# Patient Record
Sex: Female | Born: 1945 | Race: White | Hispanic: No | Marital: Married | Smoking: Current every day smoker
Health system: Southern US, Community
[De-identification: ages and names within clinical notes are randomized; demographics above are authoritative.]

## PROBLEM LIST (undated history)

## (undated) DIAGNOSIS — C801 Malignant (primary) neoplasm, unspecified: Secondary | ICD-10-CM

## (undated) DIAGNOSIS — K635 Polyp of colon: Secondary | ICD-10-CM

## (undated) DIAGNOSIS — K644 Residual hemorrhoidal skin tags: Secondary | ICD-10-CM

## (undated) DIAGNOSIS — J449 Chronic obstructive pulmonary disease, unspecified: Secondary | ICD-10-CM

## (undated) DIAGNOSIS — D099 Carcinoma in situ, unspecified: Secondary | ICD-10-CM

## (undated) DIAGNOSIS — R42 Dizziness and giddiness: Secondary | ICD-10-CM

## (undated) DIAGNOSIS — H269 Unspecified cataract: Secondary | ICD-10-CM

## (undated) DIAGNOSIS — M810 Age-related osteoporosis without current pathological fracture: Secondary | ICD-10-CM

## (undated) DIAGNOSIS — E785 Hyperlipidemia, unspecified: Secondary | ICD-10-CM

## (undated) DIAGNOSIS — K589 Irritable bowel syndrome without diarrhea: Secondary | ICD-10-CM

## (undated) DIAGNOSIS — K5790 Diverticulosis of intestine, part unspecified, without perforation or abscess without bleeding: Secondary | ICD-10-CM

## (undated) DIAGNOSIS — T7840XA Allergy, unspecified, initial encounter: Secondary | ICD-10-CM

## (undated) HISTORY — DX: Age-related osteoporosis without current pathological fracture: M81.0

## (undated) HISTORY — DX: Polyp of colon: K63.5

## (undated) HISTORY — DX: Dizziness and giddiness: R42

## (undated) HISTORY — DX: Diverticulosis of intestine, part unspecified, without perforation or abscess without bleeding: K57.90

## (undated) HISTORY — PX: PAROTIDECTOMY: SUR1003

## (undated) HISTORY — DX: Hyperlipidemia, unspecified: E78.5

## (undated) HISTORY — PX: HAMMER TOE SURGERY: SHX385

## (undated) HISTORY — PX: COLONOSCOPY: SHX174

## (undated) HISTORY — PX: TUBAL LIGATION: SHX77

## (undated) HISTORY — PX: HEMORRHOID BANDING: SHX5850

## (undated) HISTORY — PX: POLYPECTOMY: SHX149

## (undated) HISTORY — PX: LEEP: SHX91

## (undated) HISTORY — PX: BREAST BIOPSY: SHX20

## (undated) HISTORY — DX: Carcinoma in situ, unspecified: D09.9

## (undated) HISTORY — DX: Allergy, unspecified, initial encounter: T78.40XA

## (undated) HISTORY — DX: Residual hemorrhoidal skin tags: K64.4

## (undated) HISTORY — DX: Chronic obstructive pulmonary disease, unspecified: J44.9

## (undated) HISTORY — DX: Unspecified cataract: H26.9

## (undated) HISTORY — PX: TONSILLECTOMY: SUR1361

## (undated) HISTORY — DX: Irritable bowel syndrome, unspecified: K58.9

## (undated) HISTORY — PX: GANGLION CYST EXCISION: SHX1691

---

## 2009-11-21 ENCOUNTER — Encounter: Payer: Self-pay | Admitting: Family Medicine

## 2010-01-24 ENCOUNTER — Encounter: Payer: Self-pay | Admitting: Family Medicine

## 2010-03-06 ENCOUNTER — Ambulatory Visit: Payer: Self-pay | Admitting: Family Medicine

## 2010-03-06 DIAGNOSIS — E785 Hyperlipidemia, unspecified: Secondary | ICD-10-CM | POA: Insufficient documentation

## 2010-03-06 DIAGNOSIS — J309 Allergic rhinitis, unspecified: Secondary | ICD-10-CM | POA: Insufficient documentation

## 2010-03-25 ENCOUNTER — Encounter: Payer: Self-pay | Admitting: Family Medicine

## 2010-05-29 ENCOUNTER — Telehealth: Payer: Self-pay | Admitting: Family Medicine

## 2010-05-29 ENCOUNTER — Encounter (INDEPENDENT_AMBULATORY_CARE_PROVIDER_SITE_OTHER): Payer: Self-pay | Admitting: *Deleted

## 2010-05-30 ENCOUNTER — Encounter: Payer: Self-pay | Admitting: Family Medicine

## 2010-06-10 ENCOUNTER — Telehealth: Payer: Self-pay | Admitting: Family Medicine

## 2010-06-13 ENCOUNTER — Encounter: Payer: Self-pay | Admitting: Family Medicine

## 2010-06-18 NOTE — Letter (Signed)
Summary: Records from Dr. Lenn Cal 2007 - 2011  Records from Dr. Lenn Cal 2007 - 2011   Imported By: Maryln Gottron 03/21/2010 14:04:09  _____________________________________________________________________  External Attachment:    Type:   Image     Comment:   External Document

## 2010-06-18 NOTE — Assessment & Plan Note (Signed)
Summary: NEW PT TO EST/CLE   Vital Signs:  Patient profile:   65 year old female Height:      62 inches Weight:      161 pounds BMI:     29.55 Temp:     98.3 degrees F oral Pulse rate:   72 / minute Pulse rhythm:   regular BP sitting:   130 / 70  (left arm) Cuff size:   regular  Vitals Entered By: Linde Gillis CMA Duncan Dull) (March 06, 2010 10:51 AM) CC: new patient, establish care   History of Present Illness: 65 yo here to establish care.  Relocated here from [place] a few months ago to be closer to her daughter who lives in [place]. Overall, adjusting well here.  Misses her friends but she and her husband travel frequently since they are retired.  She is enjoying retirement. Spends winters at their home in [place].  HLD- On Pravachol 10 mg daily.  Has been doing well on this for years.  Brings in her lab work from this past summer.  Seasonal allergies- sinus congestion seems worse since she moved here.  taking Mucinex D almost every day.  No wheezing, cough or shortness of breath.  No fevers or chills. Sometimes develops facial pressure.  Never been on anything else for her allergies.  Tobacco abuse- 1 ppd x 40 years.  Never quit in past, she is considering it.    Preventive Screening-Counseling & Management  Alcohol-Tobacco     Smoking Status: current      Drug Use:  no.    Current Medications (verified): 1)  Pravachol 10 Mg Tabs (Pravastatin Sodium) .... Take One Tablet By Mouth Daily 2)  Aspirin 81 Mg Tabs (Aspirin) .... Take One Tablet By Mouth Daily 3)  Estroven  Tabs (Nutritional Supplements) .... Take One Tablet By Mouth Daily 4)  Fluticasone Propionate 50 Mcg/act  Susp (Fluticasone Propionate) .... 2 Sprays Each Nostril Once Daily  Allergies (verified): 1)  ! Penicillin  Past History:  Family History: Last updated: 03/06/2010 Family History Ovarian cancer- mom died at 29 Dad died at 41 of severe asthma attack.  Social History: Last updated:  03/06/2010 Retired Married One daughter. Moved from [place] to be closer to her daughter, spends winters at [facility] home. Current Smoker Alcohol use-yes Drug use-no  Risk Factors: Smoking Status: current (03/06/2010)  Past Medical History: Hyperlipidemia Allergic rhinitis  Past Surgical History: Tonsillectomy Tubal ligation  Family History: Family History Ovarian cancer- mom died at 17 Dad died at 70 of severe asthma attack.  Social History: Retired Married One daughter. Moved from [place] to be closer to her daughter, spends winters at [facility] home. Current Smoker Alcohol use-yes Drug use-no Smoking Status:  current Drug Use:  no  Review of Systems      See HPI General:  Denies malaise. Eyes:  Denies blurring. ENT:  Denies difficulty swallowing. CV:  Denies chest pain or discomfort. Resp:  Denies shortness of breath. GI:  Denies abdominal pain, bloody stools, and change in bowel habits. GU:  Denies discharge and dysuria. MS:  Denies joint pain, joint redness, and joint swelling. Derm:  Denies rash. Neuro:  Denies headaches. Psych:  Denies anxiety and depression. Endo:  Denies cold intolerance and heat intolerance. Heme:  Denies abnormal bruising and bleeding. Allergy:  Complains of seasonal allergies.  Physical Exam  General:  alert, well-developed, and well-nourished.   Head:  normocephalic and atraumatic.   Eyes:  vision grossly intact,  pupils equal, pupils round, and pupils reactive to light.   Ears:  R ear normal and L ear normal.   Nose:  boggy turbinates. no mucosal friability, no sinus percussion tenderness, and no septum abnormalities.   Mouth:  good dentition and no gingival abnormalities.   Lungs:  Normal respiratory effort, chest expands symmetrically. Lungs are clear to auscultation, no crackles or wheezes. Heart:  Normal rate and regular rhythm. S1 and S2 normal without gallop, murmur, click, rub or other extra  sounds. Abdomen:  Bowel sounds positive,abdomen soft and non-tender without masses, organomegaly or hernias noted. Msk:  No deformity or scoliosis noted of thoracic or lumbar spine.   Extremities:  no edema Neurologic:  alert & oriented X3 and gait normal.   Skin:  Intact without suspicious lesions or rashes Psych:  Cognition and judgment appear intact. Alert and cooperative with normal attention span and concentration. No apparent delusions, illusions, hallucinations   Impression & Recommendations:  Problem # 1:  HYPERLIPIDEMIA (ICD-272.4) Assessment Unchanged Well controlled, continue current meds. Her updated medication list for this problem includes:    Pravachol 10 Mg Tabs (Pravastatin sodium) .Marland Kitchen... Take one tablet by mouth daily  Problem # 2:  ALLERGIC RHINITIS (ICD-477.9) Assessment: Deteriorated Likely due to new allergens in area.  Will start Flonase.  Pt to call in 1-2 months with an update. Her updated medication list for this problem includes:    Fluticasone Propionate 50 Mcg/act Susp (Fluticasone propionate) .Marland Kitchen... 2 sprays each nostril once daily  Problem # 3:  Preventive Health Care (ICD-V70.0) Assessment: Comment Only UTD on preventative care.  She will need "welcome to medicare visit" after 04/2010.  Pt will schedule this when she returns from [place].  Complete Medication List: 1)  Pravachol 10 Mg Tabs (Pravastatin sodium) .... Take one tablet by mouth daily 2)  Aspirin 81 Mg Tabs (Aspirin) .... Take one tablet by mouth daily 3)  Estroven Tabs (Nutritional supplements) .... Take one tablet by mouth daily 4)  Fluticasone Propionate 50 Mcg/act Susp (Fluticasone propionate) .... 2 sprays each nostril once daily  Patient Instructions: 1)  Great to meet you. 2)  Have a wonderful time in [place]. 3)  Please call me in a couple of months and let me know how you are feeling.   Prescriptions: FLUTICASONE PROPIONATE 50 MCG/ACT  SUSP (FLUTICASONE PROPIONATE) 2 sprays each  nostril once daily  #1 vial x 3   Entered and Authorized by:   Ruthe Mannan MD   Signed by:   Ruthe Mannan MD on 03/06/2010   Method used:   Print then Give to Patient   RxID:   [id number]    Orders Added: 1)  New Patient Level II [56213]    Current Allergies (reviewed today): ! PENICILLIN Herpes Zoster Result Date:  08/22/2009 Herpes Zoster Result:  historical HDL Result Date:  11/29/2009 HDL Result:  63 LDL Result Date:  11/29/2009 LDL Result:  87 Colonoscopy Result Date:  02/24/2006 Colonoscopy Result:  historical Colonoscopy Next Due:  5 yr PAP Result Date:  11/20/2009 PAP Result:  normal Mammogram Result Date:  11/21/2009 Mammogram Result:  normal

## 2010-06-18 NOTE — Miscellaneous (Signed)
Summary: Flu vaccine  Clinical Lists Changes  Observations: Added new observation of FLU VAX: Historical (03/21/2010 16:25)      Influenza Immunization History:    Influenza # 1:  Historical (03/21/2010) Received form from [facility]/[address], [place], [place]

## 2010-06-18 NOTE — Letter (Signed)
Summary: Records Dated 11-21-09 thru 01-24-10/[facility]  Records Dated 11-21-09 thru 01-24-10/[facility]   Imported By: Lanelle Bal 03/13/2010 12:58:35  _____________________________________________________________________  External Attachment:    Type:   Image     Comment:   External Document

## 2010-06-20 NOTE — Progress Notes (Signed)
Summary: regarding pravachol  Phone Note From Pharmacy   Caller: [facility] Summary of Call: [facility] is asking for substitution on pravachol 10 mg's.  Pt cant take generic so she is asking that we send in brand name 20 mg's and she will cut in half.  Form is on your shelf Initial call taken by: [name] CMA, AAMA,  May 29, 2010 10:12 AM  Follow-up for Phone Call        in my box. Ruthe Mannan MD  May 29, 2010 10:15 AM Form faxed.    [name] CMA, AAMA  May 29, 2010 12:59 PM

## 2010-06-20 NOTE — Miscellaneous (Signed)
Summary: med list update  Clinical Lists Changes  Medications: Changed medication from PRAVACHOL 10 MG TABS (PRAVASTATIN SODIUM) take one tablet by mouth daily to PRAVACHOL 20 MG TABS (PRAVASTATIN SODIUM) take one half tab by mouth daily     Prior Medications: ASPIRIN 81 MG TABS (ASPIRIN) take one tablet by mouth daily ESTROVEN  TABS (NUTRITIONAL SUPPLEMENTS) take one tablet by mouth daily FLUTICASONE PROPIONATE 50 MCG/ACT  SUSP (FLUTICASONE PROPIONATE) 2 sprays each nostril once daily Current Allergies: ! PENICILLIN

## 2010-06-20 NOTE — Medication Information (Signed)
Summary: New Rx for Pravachol  New Rx for Pravachol   Imported By: Maryln Gottron 06/12/2010 12:39:23  _____________________________________________________________________  External Attachment:    Type:   Image     Comment:   External Document

## 2010-06-26 NOTE — Medication Information (Signed)
Summary: drug request form  drug request form   Imported By: Kassie Mends 06/21/2010 11:06:00  _____________________________________________________________________  External Attachment:    Type:   Image     Comment:   External Document

## 2010-07-04 NOTE — Progress Notes (Signed)
Summary: provachol  Phone Note Call from Patient Call back at Home Phone [phone]   Caller: Patient Call For: Abigail Willis Complaint: Cough/Sore throat Summary of Call: Patient received letter from [facility] stating that provachol is not on the formulary. She is asking if you could write rx for something else. Please call patient when rx is ready. Initial call taken by: Melody Comas,  June 10, 2010 2:18 PM  Follow-up for Phone Call        Spoke with patient, she does not want another medications she wants [place] to call her insurance company to get prior authorization or an exception as to why she needs the brand name of this medication.  Will send to Jacki Cones to start the prior authorization process.  Linde Gillis CMA Duncan Dull)  June 10, 2010 2:45 PM   Additional Follow-up for Phone Call Additional follow up Details #1::        [facility] for pt to call.  I need the phone number of her insurance company listed on the letter that she received, as well as her ID number.   [name] CMA, AAMA  June 11, 2010 12:56 PM  Prior Berkley Harvey form is on your desk, pt states she is unable to take the generic, that caused dizziness. Additional Follow-up by: [name] CMA, AAMA,  June 13, 2010 10:33 AM    Additional Follow-up for Phone Call Additional follow up Details #2::    in my box. Abigail Willis  June 13, 2010 2:55 PM  Denial letter for pravachol is on your desk.              [name] CMA, AAMA  June 18, 2010 4:07 PM please inform pt. Abigail Willis  June 19, 2010 7:35 AM          New/Updated Medications: SIMVASTATIN 20 MG TABS (SIMVASTATIN) 1 by mouth at bedtime Prescriptions: SIMVASTATIN 20 MG TABS (SIMVASTATIN) 1 by mouth at bedtime  #90 x 3   Entered by:   Melody Comas   Authorized by:   Abigail Willis   Signed by:   Melody Comas on 06/26/2010   Method used:   Faxed to ...       [facility] (mail-order)             , [place]         Ph: [phone]  Fax: [phone]   RxID:   [id number] SIMVASTATIN 20 MG TABS (SIMVASTATIN) 1 by mouth at bedtime  #90 x 3   Entered and Authorized by:   Abigail Willis   Signed by:   Abigail Willis on 06/10/2010   Method used:   Print then Give to Patient   RxID:   [id number]

## 2010-08-21 ENCOUNTER — Other Ambulatory Visit: Payer: Self-pay | Admitting: Family Medicine

## 2010-08-21 DIAGNOSIS — E785 Hyperlipidemia, unspecified: Secondary | ICD-10-CM

## 2010-08-21 DIAGNOSIS — Z0000 Encounter for general adult medical examination without abnormal findings: Secondary | ICD-10-CM

## 2010-08-26 ENCOUNTER — Other Ambulatory Visit (INDEPENDENT_AMBULATORY_CARE_PROVIDER_SITE_OTHER): Payer: Medicare Other | Admitting: Family Medicine

## 2010-08-26 DIAGNOSIS — E785 Hyperlipidemia, unspecified: Secondary | ICD-10-CM

## 2010-08-26 DIAGNOSIS — Z0000 Encounter for general adult medical examination without abnormal findings: Secondary | ICD-10-CM

## 2010-08-26 LAB — HEPATIC FUNCTION PANEL
Albumin: 3.8 g/dL (ref 3.5–5.2)
Total Bilirubin: 0.5 mg/dL (ref 0.3–1.2)

## 2010-08-26 LAB — LIPID PANEL
HDL: 64 mg/dL (ref >39.00)
Total CHOL/HDL Ratio: 3
VLDL: 22.8 mg/dL (ref 0.0–40.0)

## 2010-08-26 LAB — BASIC METABOLIC PANEL
Calcium: 9 mg/dL (ref 8.4–10.5)
Creatinine, Ser: 0.8 mg/dL (ref 0.4–1.2)

## 2010-08-29 ENCOUNTER — Encounter: Payer: Self-pay | Admitting: Family Medicine

## 2010-08-29 LAB — HM PAP SMEAR

## 2010-08-29 LAB — HM MAMMOGRAPHY

## 2010-09-03 ENCOUNTER — Other Ambulatory Visit: Payer: Self-pay | Admitting: Family Medicine

## 2010-09-03 ENCOUNTER — Encounter: Payer: Self-pay | Admitting: Family Medicine

## 2010-09-03 ENCOUNTER — Ambulatory Visit (INDEPENDENT_AMBULATORY_CARE_PROVIDER_SITE_OTHER): Payer: MEDICARE | Admitting: Family Medicine

## 2010-09-03 DIAGNOSIS — Z0000 Encounter for general adult medical examination without abnormal findings: Secondary | ICD-10-CM | POA: Insufficient documentation

## 2010-09-03 DIAGNOSIS — Z1211 Encounter for screening for malignant neoplasm of colon: Secondary | ICD-10-CM

## 2010-09-03 DIAGNOSIS — Z1231 Encounter for screening mammogram for malignant neoplasm of breast: Secondary | ICD-10-CM

## 2010-09-03 DIAGNOSIS — Z1239 Encounter for other screening for malignant neoplasm of breast: Secondary | ICD-10-CM

## 2010-09-03 MED ORDER — PRAVASTATIN SODIUM 10 MG PO TABS: 10.0000 mg | ORAL_TABLET | Freq: Every evening | ORAL | Status: DC

## 2010-09-03 MED ORDER — PRAVACHOL 10 MG PO TABS: 10.0000 mg | ORAL_TABLET | Freq: Every evening | ORAL | Status: DC

## 2010-09-03 MED ORDER — PNEUMOCOCCAL VAC POLYVALENT 25 MCG/0.5ML IJ INJ: 0.5000 mL | INJECTION | Freq: Once | INTRAMUSCULAR | Status: DC

## 2010-09-03 NOTE — Assessment & Plan Note (Signed)
The patients weight, height, BMI and visual acuity have been recorded in the chart I have made referrals, counseling and provided education to the patient based review of the above and I have provided the pt with a written personalized care plan for preventive services.  

## 2010-09-03 NOTE — Patient Instructions (Signed)
Please stop by to see Abigail Willis on your way out. 

## 2010-09-03 NOTE — Progress Notes (Signed)
I have personally reviewed the Medicare Annual Wellness questionnaire and have noted 1. The patient's medical and social history 2. Their use of alcohol, tobacco or illicit drugs- current smoker. 3. Their current medications and supplements 4. The patient's functional ability including ADL's, fall risks, home safety risks and hearing or visual             Impairment.- independent for all ADLs and IADLs.  See nursing assessment for hearing/visiton. 5. Diet and physical activities- very physically active. 6. Evidence for depression or mood disorders- none  Discussed directives.  Pt is a full code.  The PMH, PSH, Social History, Family History, Medications, and allergies have been reviewed in [facility], and have been updated if relevant.

## 2010-09-03 NOTE — Progress Notes (Signed)
Addended by: Linde Gillis on: 09/03/2010 10:07 AM   Modules accepted: Orders

## 2010-09-03 NOTE — Progress Notes (Signed)
Addended by: Linde Gillis on: 09/03/2010 10:03 AM   Modules accepted: Orders

## 2010-09-10 ENCOUNTER — Telehealth: Payer: Self-pay | Admitting: *Deleted

## 2010-09-10 NOTE — Telephone Encounter (Signed)
Received faxed from [facility] stating that Pravachol 10mg  tablets are discontinued by the manufacturer.  Pravastatin 10mg  tablets are available.  Please advise, form in your IN box.

## 2010-09-11 ENCOUNTER — Encounter: Payer: Self-pay | Admitting: Family Medicine

## 2010-09-11 NOTE — Telephone Encounter (Signed)
Form faxed to [facility] and patient notified via telephone.

## 2010-09-11 NOTE — Telephone Encounter (Signed)
In my box

## 2010-11-01 ENCOUNTER — Telehealth: Payer: Self-pay | Admitting: *Deleted

## 2010-11-01 ENCOUNTER — Ambulatory Visit (AMBULATORY_SURGERY_CENTER): Payer: Medicare Other | Admitting: *Deleted

## 2010-11-01 VITALS — Ht 62.0 in | Wt 159.2 lb

## 2010-11-01 DIAGNOSIS — Z860100 Personal history of colon polyps, unspecified: Secondary | ICD-10-CM

## 2010-11-01 MED ORDER — PEG-KCL-NACL-NASULF-NA ASC-C 100 G PO SOLR: ORAL | Status: DC

## 2010-11-01 NOTE — Telephone Encounter (Signed)
PATIENT SCHEDULED FOR COLONOSCOPY WITH DR.JACOBS ON 11-15-10. SHE WAS IN FOR PREVISIT TODAY,STATES SHE HAS 2 PREVIOUS COLONOSCOPIES 2002&2007 AT [facility] BY DR.COOPER. SHE STATES SHE HAD POLYPS REMOVED DURING 1ST COLON. RELEASE OF INFORMATION SHEET SIGNED BY PATIENT AND GIVEN TO PAM PETERMAN,CMA AND SHE PLACED THIS IN YOUR BASKET AT YOUR DESK.  THANKS

## 2010-11-01 NOTE — Progress Notes (Signed)
PATIENT HAS 2 PREVIOUS COLONOSCOPIES. 2002&2007 AT [facility] BY DR.COOPER. SHE STATES SHE HAD POLYPS REMOVED DURING HER 1ST EXAM. RELEASE OF INFORMATION SHEET FILLED OUT AND SIGNED BY PATIENT, TODAY IT WAS GIVEN TO PAM PETERMAN,CMA AND PHONE NOTE SENT TO PATTY LEWIS,CMA.

## 2010-11-01 NOTE — Telephone Encounter (Signed)
Faxed Release Of Information form to [phone], [facility], [address] [employer] office. Fax [phone], [place] [phone].

## 2010-11-04 ENCOUNTER — Encounter: Payer: Self-pay | Admitting: Gastroenterology

## 2010-11-05 NOTE — Telephone Encounter (Signed)
Dr Jacobs the records are on your desk for review.  

## 2010-11-06 ENCOUNTER — Telehealth: Payer: Self-pay | Admitting: Gastroenterology

## 2010-11-06 NOTE — Telephone Encounter (Signed)
Pt aware to keep appt on 11/15/10 for colon

## 2010-11-06 NOTE — Telephone Encounter (Signed)
Colonoscopy 10/98 [place] Dr. [name]: 2 polyps, one was 1cm and pedunculated, TA on pathology; hemorrhoids Colonoscopy 02/2004 [place] Dr. [name]; "a few small sessile polyps' were hyperplastic on pathology; diverticulosis   Abigail Willis, she is good for direct colonoscopy for polyp surveillance (v12.72)

## 2010-11-06 NOTE — Telephone Encounter (Signed)
Just saw those records, opened a new telephone note before I got to this one.

## 2010-11-15 ENCOUNTER — Ambulatory Visit (AMBULATORY_SURGERY_CENTER): Payer: Medicare Other | Admitting: Gastroenterology

## 2010-11-15 ENCOUNTER — Encounter: Payer: Self-pay | Admitting: Gastroenterology

## 2010-11-15 DIAGNOSIS — K5790 Diverticulosis of intestine, part unspecified, without perforation or abscess without bleeding: Secondary | ICD-10-CM

## 2010-11-15 DIAGNOSIS — D126 Benign neoplasm of colon, unspecified: Secondary | ICD-10-CM

## 2010-11-15 DIAGNOSIS — Z860100 Personal history of colon polyps, unspecified: Secondary | ICD-10-CM

## 2010-11-15 DIAGNOSIS — K5730 Diverticulosis of large intestine without perforation or abscess without bleeding: Secondary | ICD-10-CM

## 2010-11-15 DIAGNOSIS — K635 Polyp of colon: Secondary | ICD-10-CM

## 2010-11-15 DIAGNOSIS — Z1211 Encounter for screening for malignant neoplasm of colon: Secondary | ICD-10-CM

## 2010-11-15 HISTORY — DX: Diverticulosis of intestine, part unspecified, without perforation or abscess without bleeding: K57.90

## 2010-11-15 MED ORDER — SODIUM CHLORIDE 0.9 % IV SOLN: 500.0000 mL | INTRAVENOUS | Status: DC

## 2010-11-15 NOTE — Patient Instructions (Signed)
Follow your discharge instructions.  Continue your medications.  Await pathology results.

## 2010-11-18 ENCOUNTER — Telehealth: Payer: Self-pay

## 2010-11-18 NOTE — Telephone Encounter (Signed)

## 2010-12-02 ENCOUNTER — Ambulatory Visit: Payer: MEDICARE

## 2010-12-03 ENCOUNTER — Ambulatory Visit
Admission: RE | Admit: 2010-12-03 | Discharge: 2010-12-03 | Disposition: A | Discharge status: Home / Self Care | Payer: Medicare Other | Source: Ambulatory Visit | Attending: Family Medicine | Admitting: Family Medicine

## 2010-12-03 DIAGNOSIS — Z1231 Encounter for screening mammogram for malignant neoplasm of breast: Secondary | ICD-10-CM

## 2010-12-05 ENCOUNTER — Ambulatory Visit (INDEPENDENT_AMBULATORY_CARE_PROVIDER_SITE_OTHER): Payer: Medicare Other | Admitting: Family Medicine

## 2010-12-05 ENCOUNTER — Encounter: Payer: Self-pay | Admitting: Family Medicine

## 2010-12-05 ENCOUNTER — Other Ambulatory Visit (HOSPITAL_COMMUNITY)
Admission: RE | Admit: 2010-12-05 | Discharge: 2010-12-05 | Disposition: A | Discharge status: Home / Self Care | Payer: Medicare Other | Source: Ambulatory Visit | Attending: Family Medicine | Admitting: Family Medicine

## 2010-12-05 ENCOUNTER — Encounter: Payer: Self-pay | Admitting: *Deleted

## 2010-12-05 VITALS — BP 110/70 | HR 73 | Temp 98.4°F | Ht 62.0 in | Wt 157.5 lb

## 2010-12-05 DIAGNOSIS — Z1159 Encounter for screening for other viral diseases: Secondary | ICD-10-CM | POA: Insufficient documentation

## 2010-12-05 DIAGNOSIS — Z01419 Encounter for gynecological examination (general) (routine) without abnormal findings: Secondary | ICD-10-CM

## 2010-12-05 DIAGNOSIS — Z124 Encounter for screening for malignant neoplasm of cervix: Secondary | ICD-10-CM | POA: Insufficient documentation

## 2010-12-05 NOTE — Progress Notes (Signed)
65 yo here for GYN exam. Does have h/o previous leep procedures. Had mammogram this year, colonoscopy in June. No concerns.  UTD on all prevention.   Patient Active Problem List  Diagnoses  . HYPERLIPIDEMIA  . ALLERGIC RHINITIS  . Routine general medical examination at a health care facility  . Gynecological examination   Past Medical History  Diagnosis Date  . Hyperlipidemia   . Allergy     SINUS  . IBS (irritable bowel syndrome)    Past Surgical History  Procedure Date  . Tubal ligation   . Tonsillectomy   . Colonoscopy   . Ganglion cyst excision   . Hammer toe surgery   . Breast biopsy   . Polypectomy    History  Substance Use Topics  . Smoking status: Current Everyday Smoker -- 1.0 packs/day    Types: Cigarettes  . Smokeless tobacco: Never Used  . Alcohol Use: 0.6 oz/week    1 Glasses of wine per week     SOCIAL   Family History  Problem Relation Age of Onset  . Cancer Mother   . Asthma Father   . Colon cancer Neg Hx    Allergies  Allergen Reactions  . Penicillins     REACTION: rash   Current Outpatient Prescriptions on File Prior to Visit  Medication Sig Dispense Refill  . aspirin 81 MG tablet Take 81 mg by mouth daily.        . fluticasone (FLONASE) 50 MCG/ACT nasal spray 2 sprays by Nasal route daily.        Marland Kitchen ibuprofen (ADVIL,MOTRIN) 200 MG tablet Take 200 mg by mouth as needed.        . Nutritional Supplements (ESTROVEN) TABS Take 1 tablet by mouth daily.        Marland Kitchen PRAVACHOL 10 MG tablet Take 1 tablet (10 mg total) by mouth every evening.  90 tablet  3  . pseudoephedrine-guaifenesin (MUCINEX D) 60-600 MG per tablet Take 1 tablet by mouth every 12 (twelve) hours as needed.         Current Facility-Administered Medications on File Prior to Visit  Medication Dose Route Frequency Provider Last Rate Last Dose  . 0.9 %  sodium chloride infusion  500 mL Intravenous Continuous Rob Bunting, MD       The PMH, PSH, Social History, Family History,  Medications, and allergies have been reviewed in [facility], and have been updated if relevant.    Review of Systems       See HPI General:  Denies malaise. Eyes:  Denies blurring. ENT:  Denies difficulty swallowing. CV:  Denies chest pain or discomfort. Resp:  Denies shortness of breath. GI:  Denies abdominal pain, bloody stools, and change in bowel habits. GU:  Denies discharge and dysuria. MS:  Denies joint pain, joint redness, and joint swelling. Derm:  Denies rash. Neuro:  Denies headaches. Psych:  Denies anxiety and depression. Endo:  Denies cold intolerance and heat intolerance. Heme:  Denies abnormal bruising and bleeding.   Physical Exam BP 110/70  Pulse 73  Temp(Src) 98.4 F (36.9 C) (Oral)  Ht 5\' 2"  (1.575 m)  Wt 157 lb 8 oz (71.442 kg)  BMI 28.81 kg/m2  General:  Well-developed,well-nourished,in no acute distress; alert,appropriate and cooperative throughout examination Head:  normocephalic and atraumatic.   Ears:  R ear normal and L ear normal.   Nose:  no external deformity.   Mouth:  good dentition.   Heart:  Normal rate and regular rhythm. S1 and  S2 normal without gallop, murmur, click, rub or other extra sounds. Abdomen:  Bowel sounds positive,abdomen soft and non-tender without masses, organomegaly or hernias noted. Rectal:  no external abnormalities.   Genitalia:  Pelvic Exam:        External: normal female genitalia without lesions or masses        Vagina: normal without lesions or masses        Cervix: normal without lesions or masses        Adnexa: normal bimanual exam without masses or fullness        Uterus: normal by palpation        Pap smear: performed Msk:  No deformity or scoliosis noted of thoracic or lumbar spine.   Extremities:  No clubbing, cyanosis, edema, or deformity noted with normal full range of motion of all joints.   Neurologic:  alert & oriented X3 and gait normal.   Skin:  Intact without suspicious lesions or rashes Cervical Nodes:   No lymphadenopathy noted Axillary Nodes:  No palpable lymphadenopathy Psych:  Cognition and judgment appear intact. Alert and cooperative with normal attention span and concentration. No apparent delusions, illusions, hallucinations  Assessment and Plan: 1. Gynecological examination    Pap smear performed today. Would repeat one more given previous LEEP procedures. The patient indicates understanding of these issues and agrees with the plan.

## 2010-12-12 ENCOUNTER — Ambulatory Visit (INDEPENDENT_AMBULATORY_CARE_PROVIDER_SITE_OTHER): Payer: Medicare Other | Admitting: Family Medicine

## 2010-12-12 ENCOUNTER — Encounter: Payer: Self-pay | Admitting: Family Medicine

## 2010-12-12 VITALS — BP 110/70 | HR 60 | Temp 98.5°F | Wt 158.0 lb

## 2010-12-12 DIAGNOSIS — Z01419 Encounter for gynecological examination (general) (routine) without abnormal findings: Secondary | ICD-10-CM

## 2010-12-12 NOTE — Progress Notes (Signed)
  Subjective:    Patient ID: Abigail Willis, female    DOB: [birth year], 65 y.o.   MRN: [id number]  HPI  Very pleasant female here for repeat pap smear. Pap smear performed last week during routine gynecological examination. Specimen inadequate due to lubricant.  Review of Systems     Objective:   Physical Exam There were no vitals taken for this visit.  General:  Well-developed,well-nourished,in no acute distress; alert,appropriate and cooperative throughout examination Genitalia:  Pelvic Exam:        External: normal female genitalia without lesions or masses        Vagina: normal without lesions or masses        Cervix: normal without lesions or masses        Adnexa: normal bimanual exam without masses or fullness        Uterus: normal by palpation        Pap smear: performed Psych:  Cognition and judgment appear intact. Alert and cooperative with normal attention span and concentration. No apparent delusions, illusions, hallucinations        Assessment & Plan:   1. Gynecological examination    Repeat pap smear today, free of charge, without lubricant. Does need to continue screening as she had a coloposcopy as recently as 02/2010.

## 2010-12-16 ENCOUNTER — Encounter: Payer: Self-pay | Admitting: *Deleted

## 2011-03-14 ENCOUNTER — Encounter: Payer: Self-pay | Admitting: Family Medicine

## 2011-03-14 ENCOUNTER — Encounter: Payer: Self-pay | Admitting: *Deleted

## 2011-03-14 ENCOUNTER — Ambulatory Visit (INDEPENDENT_AMBULATORY_CARE_PROVIDER_SITE_OTHER): Payer: Medicare Other | Admitting: Family Medicine

## 2011-03-14 VITALS — BP 104/70 | HR 67 | Temp 97.9°F | Ht 62.0 in | Wt 158.5 lb

## 2011-03-14 DIAGNOSIS — R109 Unspecified abdominal pain: Secondary | ICD-10-CM | POA: Insufficient documentation

## 2011-03-14 DIAGNOSIS — E785 Hyperlipidemia, unspecified: Secondary | ICD-10-CM

## 2011-03-14 DIAGNOSIS — Z8041 Family history of malignant neoplasm of ovary: Secondary | ICD-10-CM | POA: Insufficient documentation

## 2011-03-14 DIAGNOSIS — Z23 Encounter for immunization: Secondary | ICD-10-CM

## 2011-03-14 LAB — LIPID PANEL
Cholesterol: 201 mg/dL — ABNORMAL HIGH (ref 0–200)
Total CHOL/HDL Ratio: 3
VLDL: 31.4 mg/dL (ref 0.0–40.0)

## 2011-03-14 LAB — HEPATIC FUNCTION PANEL
Albumin: 4.1 g/dL (ref 3.5–5.2)
Alkaline Phosphatase: 68 U/L (ref 39–117)
Total Bilirubin: 0.5 mg/dL (ref 0.3–1.2)

## 2011-03-14 NOTE — Patient Instructions (Signed)
Good to see you. Please stop by to see Abigail Willis on your way out. I will call you next week with your lab and ultrasound results as soon as I get them. Have a good weekend.

## 2011-03-14 NOTE — Progress Notes (Signed)
Addended by: Dianne Dun on: 03/14/2011 10:39 AM   Modules accepted: Orders

## 2011-03-14 NOTE — Progress Notes (Signed)
Addended by: Dianne Dun on: 03/14/2011 10:47 AM   Modules accepted: Orders

## 2011-03-14 NOTE — Progress Notes (Signed)
65 yo here for follow up.  HLD- On Pravachol 10 mg daily. Has been doing well on this for years but wants her blood work recheck since she switched to generic 6 months ago.  Lab Results  Component Value Date   CHOL 198 08/26/2010   HDL 64.00 08/26/2010   LDLCALC 111* 08/26/2010   TRIG 114.0 08/26/2010   CHOLHDL 3 08/26/2010    Abdominal pain- past month- on and off abdominal fullness, bilateral lower abdominal cramping. Some increased bowel movements but no diarrhea, nausea, vomiting or blood in stool. Colonoscopy neg 2010-11-03. Mom died of ovarian CA at 55.  Patient Active Problem List  Diagnoses  . HYPERLIPIDEMIA  . ALLERGIC RHINITIS  . Routine general medical examination at a health care facility  . Gynecological examination   Past Medical History  Diagnosis Date  . Hyperlipidemia   . Allergy     SINUS  . IBS (irritable bowel syndrome)    Past Surgical History  Procedure Date  . Tubal ligation   . Tonsillectomy   . Colonoscopy   . Ganglion cyst excision   . Hammer toe surgery   . Breast biopsy   . Polypectomy    History  Substance Use Topics  . Smoking status: Current Everyday Smoker -- 1.0 packs/day    Types: Cigarettes  . Smokeless tobacco: Never Used  . Alcohol Use: 0.6 oz/week    1 Glasses of wine per week     SOCIAL   Family History  Problem Relation Age of Onset  . Cancer Mother   . Asthma Father   . Colon cancer Neg Hx    Allergies  Allergen Reactions  . Penicillins     REACTION: rash   Current Outpatient Prescriptions on File Prior to Visit  Medication Sig Dispense Refill  . aspirin 81 MG tablet Take 81 mg by mouth daily.        . fluticasone (FLONASE) 50 MCG/ACT nasal spray 2 sprays by Nasal route daily.        Marland Kitchen ibuprofen (ADVIL,MOTRIN) 200 MG tablet Take 200 mg by mouth as needed.        . Nutritional Supplements (ESTROVEN) TABS Take 1 tablet by mouth daily.        Marland Kitchen PRAVACHOL 10 MG tablet Take 1 tablet (10 mg total) by mouth every evening.  90  tablet  3  . pseudoephedrine-guaifenesin (MUCINEX D) 60-600 MG per tablet Take 1 tablet by mouth every 12 (twelve) hours as needed.         Current Facility-Administered Medications on File Prior to Visit  Medication Dose Route Frequency Provider Last Rate Last Dose  . 0.9 %  sodium chloride infusion  500 mL Intravenous Continuous Rob Bunting, MD       The PMH, PSH, Social History, Family History, Medications, and allergies have been reviewed in [facility], and have been updated if relevant.  Review of Systems  See HPI  General: Denies malaise.  Eyes: Denies blurring.  ENT: Denies difficulty swallowing.  CV: Denies chest pain or discomfort.  Resp: Denies shortness of breath.   Physical exam:  BP 104/70  Pulse 67  Temp(Src) 97.9 F (36.6 C) (Oral)  Ht 5\' 2"  (1.575 m)  Wt 158 lb 8 oz (71.895 kg)  BMI 28.99 kg/m2  General: alert, well-developed, and well-nourished.  Head: normocephalic and atraumatic.  Eyes: vision grossly intact, pupils equal, pupils round, and pupils reactive to light.  Ears: R ear normal and L ear normal.  Mouth:  good dentition and no gingival abnormalities.  Lungs: Normal respiratory effort, chest expands symmetrically. Lungs are clear to auscultation, no crackles or wheezes.  Heart: Normal rate and regular rhythm. S1 and S2 normal without gallop, murmur, click, rub or other extra sounds.  Abdomen: Bowel sounds positive,abdomen soft, mildly TTP over left lower quadrant, no guarding or rebound  Msk: No deformity or scoliosis noted of thoracic or lumbar spine.  Extremities: no edema  Neurologic: alert & oriented X3 and gait normal.  Skin: Intact without suspicious lesions or rashes  Psych: Cognition and judgment appear intact. Alert and cooperative with normal attention span and concentration. No apparent delusions, illusions, hallucinations  Assessment and Plan: 1. Abdominal  pain, other specified site  New, ?IBS since she has been under some increased  stress. However, given her family history, I do want to rule out ovarian process/maligancy with Ultrasound and CA 125. US Pelvis Complete, CA 125  2. Family history of ovarian cancer  CA 125  3. HYPERLIPIDEMIA   Unchanged.  Recheck labs today.   Lipid Profile, Hepatic function panel

## 2011-03-21 ENCOUNTER — Ambulatory Visit: Payer: Self-pay | Admitting: Family Medicine

## 2011-03-24 ENCOUNTER — Encounter: Payer: Self-pay | Admitting: Family Medicine

## 2011-03-25 ENCOUNTER — Other Ambulatory Visit: Payer: Self-pay | Admitting: Family Medicine

## 2011-03-25 DIAGNOSIS — R109 Unspecified abdominal pain: Secondary | ICD-10-CM

## 2011-03-25 DIAGNOSIS — N859 Noninflammatory disorder of uterus, unspecified: Secondary | ICD-10-CM

## 2011-03-25 DIAGNOSIS — Z8041 Family history of malignant neoplasm of ovary: Secondary | ICD-10-CM

## 2011-03-25 DIAGNOSIS — J309 Allergic rhinitis, unspecified: Secondary | ICD-10-CM

## 2011-03-26 ENCOUNTER — Encounter: Payer: Self-pay | Admitting: Obstetrics & Gynecology

## 2011-03-26 ENCOUNTER — Ambulatory Visit (INDEPENDENT_AMBULATORY_CARE_PROVIDER_SITE_OTHER): Payer: Medicare Other | Admitting: Obstetrics & Gynecology

## 2011-03-26 VITALS — BP 122/66 | HR 85 | Ht 62.0 in | Wt 160.0 lb

## 2011-03-26 DIAGNOSIS — R935 Abnormal findings on diagnostic imaging of other abdominal regions, including retroperitoneum: Secondary | ICD-10-CM

## 2011-03-26 NOTE — Progress Notes (Signed)
  Subjective:    Patient ID: Abigail Willis, female    DOB: [birth year], 65 y.o.   MRN: [id number]  HPINo LMP recorded. Patient is postmenopausal. Ultrasound done at [facility] showed endometrial fluid, no increased tissue. U/S in [facility] 2011 had same findings. No vag d/c or bleeding. Sx of IBS, abd pain  Past Medical History  Diagnosis Date  . Hyperlipidemia   . Allergy     SINUS  . IBS (irritable bowel syndrome)    Past Surgical History  Procedure Date  . Tubal ligation   . Tonsillectomy   . Colonoscopy   . Ganglion cyst excision   . Hammer toe surgery   . Breast biopsy   . Polypectomy   . Leep      twice, in [place]   Current Outpatient Prescriptions on File Prior to Visit  Medication Sig Dispense Refill  . ibuprofen (ADVIL,MOTRIN) 200 MG tablet Take 200 mg by mouth as needed.        . Nutritional Supplements (ESTROVEN) TABS Take 1 tablet by mouth daily.        Marland Kitchen PRAVACHOL 10 MG tablet Take 1 tablet (10 mg total) by mouth every evening.  90 tablet  3  . pseudoephedrine-guaifenesin (MUCINEX D) 60-600 MG per tablet Take 1 tablet by mouth every 12 (twelve) hours as needed.        Marland Kitchen aspirin 81 MG tablet Take 81 mg by mouth daily.         Current Facility-Administered Medications on File Prior to Visit  Medication Dose Route Frequency Provider Last Rate Last Dose  . 0.9 %  sodium chloride infusion  500 mL Intravenous Continuous Rob Bunting, MD       Allergies  Allergen Reactions  . Penicillins     REACTION: rash   Family History  Problem Relation Age of Onset  . Cancer Mother   . Asthma Father   . Colon cancer Neg Hx    Mother-ovarian ca age 7   Review of Systems Abd soft, not tenderPelvic deferredPap was done 7/12, nl per pt   Objective:   Physical Exam        Assessment & Plan:  Endometrial fluid stable on U/S, likely due to previious cervical procedures RTC prn Med records release [facility]

## 2011-03-27 ENCOUNTER — Encounter: Payer: Self-pay | Admitting: Family Medicine

## 2011-03-27 ENCOUNTER — Ambulatory Visit (INDEPENDENT_AMBULATORY_CARE_PROVIDER_SITE_OTHER): Payer: Medicare Other | Admitting: Family Medicine

## 2011-03-27 VITALS — BP 120/60 | HR 97 | Temp 97.8°F | Ht 62.0 in | Wt 159.5 lb

## 2011-03-27 DIAGNOSIS — J069 Acute upper respiratory infection, unspecified: Secondary | ICD-10-CM

## 2011-03-27 DIAGNOSIS — J40 Bronchitis, not specified as acute or chronic: Secondary | ICD-10-CM

## 2011-03-27 DIAGNOSIS — J329 Chronic sinusitis, unspecified: Secondary | ICD-10-CM

## 2011-03-27 MED ORDER — MOXIFLOXACIN HCL 400 MG PO TABS: 400.0000 mg | ORAL_TABLET | Freq: Every day | ORAL | Status: DC

## 2011-03-27 MED ORDER — FLUCONAZOLE 150 MG PO TABS: 150.0000 mg | ORAL_TABLET | Freq: Once | ORAL | Status: AC

## 2011-03-27 MED ORDER — BENZONATATE 100 MG PO CAPS: 100.0000 mg | ORAL_CAPSULE | Freq: Four times a day (QID) | ORAL | Status: DC | PRN

## 2011-03-27 NOTE — Progress Notes (Signed)
SUBJECTIVE:  Abigail Willis is a 65 y.o. female who complains of coryza, congestion, productive cough, myalgias, headache and fever for 7 days. She denies a history of anorexia, chest pain, shortness of breath, vomiting and weakness and denies a history of asthma. Patient denies smoke cigarettes.   Patient Active Problem List  Diagnoses  . HYPERLIPIDEMIA  . ALLERGIC RHINITIS  . Routine general medical examination at a health care facility  . Gynecological examination  . Abdominal  pain, other specified site  . Family history of ovarian cancer  . Fluid in endometrial cavity   Past Medical History  Diagnosis Date  . Hyperlipidemia   . Allergy     SINUS  . IBS (irritable bowel syndrome)    Past Surgical History  Procedure Date  . Tubal ligation   . Tonsillectomy   . Colonoscopy   . Ganglion cyst excision   . Hammer toe surgery   . Breast biopsy   . Polypectomy   . Leep      twice, in [place]   History  Substance Use Topics  . Smoking status: Current Everyday Smoker -- 1.0 packs/day    Types: Cigarettes  . Smokeless tobacco: Never Used  . Alcohol Use: 0.6 oz/week    1 Glasses of wine per week     SOCIAL   Family History  Problem Relation Age of Onset  . Cancer Mother   . Asthma Father   . Colon cancer Neg Hx    Allergies  Allergen Reactions  . Penicillins     REACTION: rash   Current Outpatient Prescriptions on File Prior to Visit  Medication Sig Dispense Refill  . aspirin 81 MG tablet Take 81 mg by mouth daily.        Marland Kitchen ibuprofen (ADVIL,MOTRIN) 200 MG tablet Take 200 mg by mouth as needed.        . Nutritional Supplements (ESTROVEN) TABS Take 1 tablet by mouth daily.        Marland Kitchen PRAVACHOL 10 MG tablet Take 1 tablet (10 mg total) by mouth every evening.  90 tablet  3  . pseudoephedrine-guaifenesin (MUCINEX D) 60-600 MG per tablet Take 1 tablet by mouth every 12 (twelve) hours as needed.         Current Facility-Administered Medications on File Prior to Visit    Medication Dose Route Frequency Provider Last Rate Last Dose  . 0.9 %  sodium chloride infusion  500 mL Intravenous Continuous Rob Bunting, MD       The PMH, PSH, Social History, Family History, Medications, and allergies have been reviewed in [facility], and have been updated if relevant.  OBJECTIVE: BP 120/60  Pulse 97  Temp(Src) 97.8 F (36.6 C) (Oral)  Ht 5\' 2"  (1.575 m)  Wt 159 lb 8 oz (72.349 kg)  BMI 29.17 kg/m2  She appears well, vital signs are as noted. Ears normal.  Throat and pharynx normal.  Neck supple. No adenopathy in the neck. Nose is congested. Sinuses TTP throughout. Pos exp wheezes bilaterally,  ASSESSMENT:  sinusitis and bronchitis  PLAN: Avelox 400 mg daily x 7 days (PCN allergic) Symptomatic therapy suggested: push fluids, rest and return office visit prn if symptoms persist or worsen. Lack of antibiotic effectiveness discussed with her. Call or return to clinic prn if these symptoms worsen or fail to improve as anticipated.

## 2011-03-27 NOTE — Patient Instructions (Signed)
Good to see you. Have a great trip! Take antibiotic as directed.  Drink lots of fluids.  Treat sympotmatically with Mucinex, nasal saline irrigation, and Tylenol/Ibuprofen. Cough suppressant as needed.

## 2011-04-02 ENCOUNTER — Encounter: Payer: Medicare Other | Admitting: Obstetrics & Gynecology

## 2011-04-08 ENCOUNTER — Encounter: Payer: Self-pay | Admitting: Family Medicine

## 2011-04-08 ENCOUNTER — Encounter: Payer: Medicare Other | Admitting: Family Medicine

## 2011-04-09 NOTE — Progress Notes (Signed)
This encounter was created in error - please disregard.

## 2011-09-11 ENCOUNTER — Other Ambulatory Visit: Payer: Self-pay

## 2011-09-11 MED ORDER — PRAVASTATIN SODIUM 10 MG PO TABS: 10.0000 mg | ORAL_TABLET | Freq: Every evening | ORAL | Status: DC

## 2011-09-11 NOTE — Telephone Encounter (Signed)
Pt request 30 day refill pravastatin 10 mg #30 x 0 to [facility]. Pt notified done while pt on phone. Pt has CPX with Dr Dayton Martes 10/01/11.

## 2011-09-19 ENCOUNTER — Other Ambulatory Visit: Payer: Self-pay | Admitting: Family Medicine

## 2011-09-19 DIAGNOSIS — E785 Hyperlipidemia, unspecified: Secondary | ICD-10-CM

## 2011-09-19 DIAGNOSIS — Z0000 Encounter for general adult medical examination without abnormal findings: Secondary | ICD-10-CM

## 2011-09-24 ENCOUNTER — Other Ambulatory Visit (INDEPENDENT_AMBULATORY_CARE_PROVIDER_SITE_OTHER): Payer: Medicare Other

## 2011-09-24 DIAGNOSIS — Z0000 Encounter for general adult medical examination without abnormal findings: Secondary | ICD-10-CM

## 2011-09-24 DIAGNOSIS — E785 Hyperlipidemia, unspecified: Secondary | ICD-10-CM

## 2011-09-24 LAB — COMPREHENSIVE METABOLIC PANEL
AST: 26 U/L (ref 0–37)
Albumin: 3.9 g/dL (ref 3.5–5.2)
Alkaline Phosphatase: 68 U/L (ref 39–117)
Calcium: 9 mg/dL (ref 8.4–10.5)
Chloride: 104 mEq/L (ref 96–112)
Glucose, Bld: 89 mg/dL (ref 70–99)
Potassium: 4.2 mEq/L (ref 3.5–5.1)
Sodium: 140 mEq/L (ref 135–145)
Total Protein: 7 g/dL (ref 6.0–8.3)

## 2011-09-24 LAB — LIPID PANEL
Cholesterol: 193 mg/dL (ref 0–200)
LDL Cholesterol: 95 mg/dL (ref 0–99)
Total CHOL/HDL Ratio: 3

## 2011-10-01 ENCOUNTER — Ambulatory Visit (INDEPENDENT_AMBULATORY_CARE_PROVIDER_SITE_OTHER): Payer: Medicare Other | Admitting: Family Medicine

## 2011-10-01 ENCOUNTER — Encounter: Payer: Self-pay | Admitting: Family Medicine

## 2011-10-01 VITALS — BP 110/64 | HR 68 | Ht 61.5 in | Wt 158.0 lb

## 2011-10-01 DIAGNOSIS — Z0000 Encounter for general adult medical examination without abnormal findings: Secondary | ICD-10-CM

## 2011-10-01 DIAGNOSIS — E785 Hyperlipidemia, unspecified: Secondary | ICD-10-CM

## 2011-10-01 MED ORDER — PRAVASTATIN SODIUM 10 MG PO TABS: 10.0000 mg | ORAL_TABLET | Freq: Every evening | ORAL | Status: DC

## 2011-10-01 MED ORDER — FLUTICASONE PROPIONATE 50 MCG/ACT NA SUSP: 2.0000 | Freq: Every day | NASAL | Status: DC

## 2011-10-01 NOTE — Patient Instructions (Signed)
Great to see you. Please set up your mammogram in July. Try some Allegra over the counter- Claritin, Zyrtec, Benadryl.

## 2011-10-01 NOTE — Progress Notes (Signed)
66 yo pleasant female here for medicare annual wellness visit.  I have personally reviewed the Medicare Annual Wellness questionnaire and have noted 1. The patient's medical and social history 2. Their use of alcohol, tobacco or illicit drugs 3. Their current medications and supplements 4. The patient's functional ability including ADL's, fall risks, home safety risks and hearing or visual             impairment. 5. Diet and physical activities 6. Evidence for depression or mood disorders  HLD- taking Pravachol 10 mg qhs. Lab Results  Component Value Date   CHOL 193 09/24/2011   HDL 71.10 09/24/2011   LDLCALC 95 09/24/2011   LDLDIRECT 116.2 03/14/2011   TRIG 137.0 09/24/2011   CHOLHDL 3 09/24/2011    UTD on prevention-  Colonoscopy last June- advised follow up in 3-5 due to h/o polyps.  Spent the summer in [place]- went on 2 cruises!  Had an excellent time. Sees dermatologist in [place], had mohs surgery on right side of nose- BCC.  Still smoking 1 ppd but thinking about cutting back.  Patient Active Problem List  Diagnoses  . HYPERLIPIDEMIA  . ALLERGIC RHINITIS  . Routine general medical examination at a health care facility  . Gynecological examination  . Abdominal  pain, other specified site  . Family history of ovarian cancer  . Fluid in endometrial cavity   Past Medical History  Diagnosis Date  . Hyperlipidemia   . Allergy     SINUS  . IBS (irritable bowel syndrome)    Past Surgical History  Procedure Date  . Tubal ligation   . Tonsillectomy   . Colonoscopy   . Ganglion cyst excision   . Hammer toe surgery   . Breast biopsy   . Polypectomy   . Leep      twice, in [place]   History  Substance Use Topics  . Smoking status: Current Everyday Smoker -- 1.0 packs/day    Types: Cigarettes  . Smokeless tobacco: Never Used  . Alcohol Use: 0.6 oz/week    1 Glasses of wine per week     SOCIAL   Family History  Problem Relation Age of Onset  . Cancer Mother   .  Asthma Father   . Colon cancer Neg Hx    Allergies  Allergen Reactions  . Penicillins     REACTION: rash   Current Outpatient Prescriptions on File Prior to Visit  Medication Sig Dispense Refill  . aspirin 81 MG tablet Take 81 mg by mouth daily.        Marland Kitchen ibuprofen (ADVIL,MOTRIN) 200 MG tablet Take 200 mg by mouth as needed.        . Nutritional Supplements (ESTROVEN) TABS Take 1 tablet by mouth daily.        . pseudoephedrine-guaifenesin (MUCINEX D) 60-600 MG per tablet Take 1 tablet by mouth every 12 (twelve) hours as needed.        Marland Kitchen DISCONTD: pravastatin (PRAVACHOL) 10 MG tablet Take 1 tablet (10 mg total) by mouth every evening.  30 tablet  0  . fluticasone (FLONASE) 50 MCG/ACT nasal spray Place 2 sprays into the nose daily.  16 g  1   Current Facility-Administered Medications on File Prior to Visit  Medication Dose Route Frequency Provider Last Rate Last Dose  . DISCONTD: 0.9 %  sodium chloride infusion  500 mL Intravenous Continuous Rachael Fee, MD       The PMH, PSH, Social History, Family History, Medications, and  allergies have been reviewed in [facility], and have been updated if relevant.  ROS: See HPI Patient reports no  vision/ hearing changes,anorexia, weight change, fever ,adenopathy, persistant / recurrent hoarseness, swallowing issues, chest pain, edema,persistant / recurrent cough, hemoptysis, dyspnea(rest, exertional, paroxysmal nocturnal), gastrointestinal  bleeding (melena, rectal bleeding), abdominal pain, excessive heart burn, GU symptoms(dysuria, hematuria, pyuria, voiding/incontinence  Issues) syncope, focal weakness, severe memory loss, concerning skin lesions, depression, anxiety, abnormal bruising/bleeding, major joint swelling, breast masses or abnormal vaginal bleeding.    Physical exam: BP 110/64  Pulse 68  Ht 5' 1.5" (1.562 m)  Wt 158 lb (71.668 kg)  BMI 29.37 kg/m2  General:  Well-developed,well-nourished,in no acute distress; alert,appropriate and  cooperative throughout examination Head:  normocephalic and atraumatic.   Eyes:  vision grossly intact, pupils equal, pupils round, and pupils reactive to light.   Ears:  R ear normal and L ear normal.   Nose:  no external deformity.   Mouth:  good dentition.   Neck:  No deformities, masses, or tenderness noted. Lungs:  Normal respiratory effort, chest expands symmetrically. Lungs are clear to auscultation, no crackles or wheezes. Heart:  Normal rate and regular rhythm. S1 and S2 normal without gallop, murmur, click, rub or other extra sounds. Abdomen:  Bowel sounds positive,abdomen soft and non-tender without masses, organomegaly or hernias noted. Msk:  No deformity or scoliosis noted of thoracic or lumbar spine.   Extremities:  No clubbing, cyanosis, edema, or deformity noted with normal full range of motion of all joints.   Neurologic:  alert & oriented X3 and gait normal.   Skin:  Intact without suspicious lesions or rashes Psych:  Cognition and judgment appear intact. Alert and cooperative with normal attention span and concentration. No apparent delusions, illusions, hallucinations  Assessment and Plan: 1. Routine general medical examination at a health care facility  The patients weight, height, BMI and visual acuity have been recorded in the chart I have made referrals, counseling and provided education to the patient based review of the above and I have provided the pt with a written personalized care plan for preventive services.   2. HLD- Stable on current dose of Pravachol. [facility]

## 2011-10-07 ENCOUNTER — Other Ambulatory Visit: Payer: Self-pay

## 2011-10-07 MED ORDER — PRAVASTATIN SODIUM 10 MG PO TABS: 10.0000 mg | ORAL_TABLET | Freq: Every evening | ORAL | Status: DC

## 2011-10-07 NOTE — Telephone Encounter (Signed)
[facility] on pravastatin 10 mg that was sent on 10/01/11. [facility]rav[facility]atin 10 mg # 90 x 3. Pt notified while on phone.

## 2011-10-10 ENCOUNTER — Other Ambulatory Visit: Payer: Self-pay

## 2011-10-10 MED ORDER — PRAVASTATIN SODIUM 10 MG PO TABS: 10.0000 mg | ORAL_TABLET | Freq: Every evening | ORAL | Status: DC

## 2011-10-10 NOTE — Telephone Encounter (Signed)
Pt just found out [facility] is her pharmacy now. Pravastatin changed to [facility]. # 90 x 3. [facility].

## 2011-10-28 ENCOUNTER — Other Ambulatory Visit: Payer: Self-pay | Admitting: Family Medicine

## 2011-10-28 DIAGNOSIS — Z1231 Encounter for screening mammogram for malignant neoplasm of breast: Secondary | ICD-10-CM

## 2011-11-07 ENCOUNTER — Encounter: Payer: Self-pay | Admitting: Obstetrics & Gynecology

## 2011-12-04 ENCOUNTER — Ambulatory Visit
Admission: RE | Admit: 2011-12-04 | Discharge: 2011-12-04 | Disposition: A | Discharge status: Home / Self Care | Payer: Medicare Other | Source: Ambulatory Visit | Attending: Family Medicine | Admitting: Family Medicine

## 2011-12-04 ENCOUNTER — Encounter: Payer: Self-pay | Admitting: Family Medicine

## 2011-12-04 ENCOUNTER — Encounter: Payer: Self-pay | Admitting: *Deleted

## 2011-12-04 DIAGNOSIS — Z1231 Encounter for screening mammogram for malignant neoplasm of breast: Secondary | ICD-10-CM

## 2012-02-20 ENCOUNTER — Ambulatory Visit (INDEPENDENT_AMBULATORY_CARE_PROVIDER_SITE_OTHER): Payer: Medicare Other

## 2012-02-20 DIAGNOSIS — Z23 Encounter for immunization: Secondary | ICD-10-CM

## 2012-03-12 ENCOUNTER — Encounter: Payer: Self-pay | Admitting: Family Medicine

## 2012-03-12 ENCOUNTER — Ambulatory Visit (INDEPENDENT_AMBULATORY_CARE_PROVIDER_SITE_OTHER): Payer: Medicare Other | Admitting: Family Medicine

## 2012-03-12 VITALS — BP 120/70 | HR 84 | Temp 98.1°F | Wt 161.0 lb

## 2012-03-12 DIAGNOSIS — J069 Acute upper respiratory infection, unspecified: Secondary | ICD-10-CM

## 2012-03-12 MED ORDER — MOXIFLOXACIN HCL 400 MG PO TABS: 400.0000 mg | ORAL_TABLET | Freq: Every day | ORAL | Status: AC

## 2012-03-12 NOTE — Patient Instructions (Addendum)
Take Avelox as directed.  Drink lots of fluids.  Call if not improving as expected in 5-7 days.

## 2012-03-12 NOTE — Progress Notes (Signed)
SUBJECTIVE:  Abigail Willis is a 66 y.o. female who complains of coryza, congestion, sneezing, sore throat and productive cough for 14 days.  Also developed loose stools yesterday.   She denies a history of anorexia and chest pain and denies a history of asthma. Patient admits to smoke cigarettes.   Patient Active Problem List  Diagnosis  . HYPERLIPIDEMIA  . ALLERGIC RHINITIS  . Routine general medical examination at a health care facility  . Gynecological examination  . Abdominal  pain, other specified site  . Family history of ovarian cancer  . Fluid in endometrial cavity   Past Medical History  Diagnosis Date  . Hyperlipidemia   . Allergy     SINUS  . IBS (irritable bowel syndrome)    Past Surgical History  Procedure Date  . Tubal ligation   . Tonsillectomy   . Colonoscopy   . Ganglion cyst excision   . Hammer toe surgery   . Breast biopsy   . Polypectomy   . Leep      twice, in [place]   History  Substance Use Topics  . Smoking status: Current Every Day Smoker -- 1.0 packs/day    Types: Cigarettes  . Smokeless tobacco: Never Used  . Alcohol Use: 0.6 oz/week    1 Glasses of wine per week     SOCIAL   Family History  Problem Relation Age of Onset  . Cancer Mother   . Asthma Father   . Colon cancer Neg Hx    Allergies  Allergen Reactions  . Penicillins     REACTION: rash   Current Outpatient Prescriptions on File Prior to Visit  Medication Sig Dispense Refill  . aspirin 81 MG tablet Take 81 mg by mouth daily.        . fluticasone (FLONASE) 50 MCG/ACT nasal spray Place 2 sprays into the nose daily.  16 g  1  . ibuprofen (ADVIL,MOTRIN) 200 MG tablet Take 200 mg by mouth as needed.        . pravastatin (PRAVACHOL) 10 MG tablet Take 1 tablet (10 mg total) by mouth every evening.  90 tablet  3  . pseudoephedrine-guaifenesin (MUCINEX D) 60-600 MG per tablet Take 1 tablet by mouth every 12 (twelve) hours as needed.         The PMH, PSH, Social History, Family History,  Medications, and allergies have been reviewed in [facility], and have been updated if relevant.  OBJECTIVE: BP 120/70  Pulse 84  Temp 98.1 F (36.7 C)  Wt 161 lb (73.029 kg)  She appears well, vital signs are as noted. Ears normal.  Throat and pharynx normal.  Neck supple. No adenopathy in the neck. Nose is congested. Sinuses tender. Scattered exp wheezes without crackles  ASSESSMENT:  sinusitis and bronchitis  PLAN: Given duration and progression of symptoms, will treat for bacterial sinusitis/bronchitis. PCN allergic- treat with Avelox. Symptomatic therapy suggested: push fluids, rest and return office visit prn if symptoms persist or worsen. . Call or return to clinic prn if these symptoms worsen or fail to improve as anticipated.

## 2012-03-16 ENCOUNTER — Telehealth: Payer: Self-pay

## 2012-03-16 MED ORDER — DOXYCYCLINE HYCLATE 100 MG PO TABS: 100.0000 mg | ORAL_TABLET | Freq: Two times a day (BID) | ORAL | Status: DC

## 2012-03-16 NOTE — Telephone Encounter (Signed)
Pt saw Dr Dayton Martes 03/12/12; Avelox is causing severe stomach cramping with slight nausea. Pt wants a different antibiotic called to  [facility]Please advise.

## 2012-03-16 NOTE — Telephone Encounter (Signed)
Noted.   Doxycycline sent to her pharmacy.  Please update [place] with her symptoms.

## 2012-03-24 ENCOUNTER — Ambulatory Visit (INDEPENDENT_AMBULATORY_CARE_PROVIDER_SITE_OTHER): Payer: Medicare Other | Admitting: Family Medicine

## 2012-03-24 ENCOUNTER — Encounter: Payer: Self-pay | Admitting: Family Medicine

## 2012-03-24 VITALS — BP 132/64 | HR 64 | Temp 97.9°F | Wt 160.0 lb

## 2012-03-24 DIAGNOSIS — R109 Unspecified abdominal pain: Secondary | ICD-10-CM

## 2012-03-24 DIAGNOSIS — R197 Diarrhea, unspecified: Secondary | ICD-10-CM

## 2012-03-24 DIAGNOSIS — J40 Bronchitis, not specified as acute or chronic: Secondary | ICD-10-CM | POA: Insufficient documentation

## 2012-03-24 MED ORDER — METRONIDAZOLE 500 MG PO TABS: 500.0000 mg | ORAL_TABLET | Freq: Three times a day (TID) | ORAL | Status: AC

## 2012-03-24 NOTE — Patient Instructions (Addendum)
Good to see you, Ms. Willits. Please start taking Align 4 mg daily (this is over the counter). Please bring back a stool sample.  Start Flagyl if your symptoms persist. Clostridium Difficile Infection Clostridium difficile (C. diff) is a bacteria found in the intestinal tract or colon. Under certain conditions, it causes diarrhea and sometimes severe disease. The severe form of the disease is known as pseudomembranous colitis (often called C. diff colitis). This disease can damage the lining of the colon or cause the colon to become enlarged (toxic megacolon).  CAUSES  Your colon normally contains many different bacteria, including C. diff. The balance of bacteria in your colon can change during illness. This is especially true when you take antibiotic medicine. Taking antibiotics may allow the C. diff to grow, multiply excessively, and make a toxin that then causes illness. The elderly and people with certain medical conditions have a greater risk of getting C. diff infections. SYMPTOMS   Watery diarrhea.  Fever.  Fatigue.  Loss of appetite.  Nausea.  Abdominal swelling, pain, or tenderness.  Dehydration. DIAGNOSIS  Your symptoms may make your caregiver suspicious of a C. diff infection, especially if you have used antibiotics in the preceding weeks. However, there are only 2 ways to know for certain whether you have a C. diff infection:  A lab test that finds the toxin in your stool.  The specific appearance of an abnormality (pseudomembrane) in your colon. This can only be seen by doing a sigmoidoscopy or colonoscopy. These procedures involve passing an instrument through your rectum to look at the inside of your colon. Your caregiver will help determine if these tests are necessary. TREATMENT   Most people are successfully treated with 1 of 2 specific antibiotics, usually given by mouth. Other antibiotics you are receiving are stopped if possible.  Intravenous (IV) fluids and  correction of electrolyte imbalance may be necessary.  Rarely, surgery may be needed to remove the infected part of the intestines.  Careful hand washing by you and your caregivers is important to prevent the spread of infection. In the hospital, your caregivers may also put on gowns and gloves to prevent the spread of the C. diff bacteria. Your room is also cleaned regularly with a hospital grade disinfectant. HOME CARE INSTRUCTIONS  Drink enough fluids to keep your urine clear or pale yellow. Avoid milk, caffeine, and alcohol.  Ask your caregiver for specific rehydration instructions.  Try eating small, frequent meals rather than large meals.  Take your antibiotics as directed. Finish them even if you start to feel better.  Do not use medicines to slow diarrhea. This could delay healing or cause complications.  Wash your hands thoroughly after using the bathroom and before preparing food.  Make sure people who live with you wash their hands often, too. SEEK MEDICAL CARE IF:  Diarrhea persists longer than expected or recurs after completing your course of antibiotic treatment for the C. diff infection.  You have trouble staying hydrated. SEEK IMMEDIATE MEDICAL CARE IF:  You develop a new fever.  You have increasing abdominal pain or tenderness.  There is blood in your stools, or your stools are dark black and tarry.  You cannot hold down food or liquids. MAKE SURE YOU:   Understand these instructions.  Will watch your condition.  Will get help right away if you are not doing well or get worse. Document Released: 02/12/2005 Document Revised: 07/28/2011 Document Reviewed: 10/11/2010 [facility] Patient Information 2013 [place], [place].

## 2012-03-24 NOTE — Progress Notes (Signed)
SUBJECTIVE:  Abigail Willis is a 66 y.o. female here for abdominal pain.  I saw her on 10/25 with a 14 day h/o  who complains of coryza, congestion, sneezing, sore throat and productive cough for 14 days.    She is a smoker and had wheezes on exam.  We gave her course of Avelox which she stopped as she developed diarrhea.  Started doxycycline and again stopped this for abdominal pain and very watery diarrhea.  Abdominal pain is not localized- moves around- feels more like cramping . She is gassy.  A little nauseated, no vomiting.  No fevers.  Respiratory symptoms have almost completely resolved.   Patient Active Problem List  Diagnosis  . HYPERLIPIDEMIA  . ALLERGIC RHINITIS  . Routine general medical examination at a health care facility  . Gynecological examination  . Abdominal  pain, other specified site  . Family history of ovarian cancer  . Fluid in endometrial cavity  . Bronchitis   Past Medical History  Diagnosis Date  . Hyperlipidemia   . Allergy     SINUS  . IBS (irritable bowel syndrome)    Past Surgical History  Procedure Date  . Tubal ligation   . Tonsillectomy   . Colonoscopy   . Ganglion cyst excision   . Hammer toe surgery   . Breast biopsy   . Polypectomy   . Leep      twice, in [place]   History  Substance Use Topics  . Smoking status: Current Every Day Smoker -- 1.0 packs/day    Types: Cigarettes  . Smokeless tobacco: Never Used  . Alcohol Use: 0.6 oz/week    1 Glasses of wine per week     Comment: SOCIAL   Family History  Problem Relation Age of Onset  . Cancer Mother   . Asthma Father   . Colon cancer Neg Hx    Allergies  Allergen Reactions  . Avelox (Moxifloxacin Hcl In Nacl) Other (See Comments)    Severe stomach cramping and nausea  . Penicillins     REACTION: rash   Current Outpatient Prescriptions on File Prior to Visit  Medication Sig Dispense Refill  . aspirin 81 MG tablet Take 81 mg by mouth daily.        . fluticasone (FLONASE)  50 MCG/ACT nasal spray Place 2 sprays into the nose daily.  16 g  1  . ibuprofen (ADVIL,MOTRIN) 200 MG tablet Take 200 mg by mouth as needed.        . pravastatin (PRAVACHOL) 10 MG tablet Take 1 tablet (10 mg total) by mouth every evening.  90 tablet  3  . pseudoephedrine-guaifenesin (MUCINEX D) 60-600 MG per tablet Take 1 tablet by mouth every 12 (twelve) hours as needed.         The PMH, PSH, Social History, Family History, Medications, and allergies have been reviewed in [facility], and have been updated if relevant.  OBJECTIVE: BP 132/64  Pulse 64  Temp 97.9 F (36.6 C)  Wt 160 lb (72.576 kg)  She appears well, vital signs are as noted. Ears normal.  Throat and pharynx normal.  Neck supple. No adenopathy in the neck. Nose is congested. Sinuses tender. Lung clear Abd:  Soft, NT throughout, pos BP  ASSESSMENT/PLAN:   1. Bronchitis  Improved.  Lungs clear on exam.   2. Diarrhea  New- diarrhea associated vs. C. Diff.  Will start probiotic- align.  Also get stool culture, treat for presumed C. Diff with flagyl if symptoms  persist (she is going out of town today). Call me later this week with an update.  Red flag symptoms given to pt requiring immediate follow up.  The patient indicates understanding of these issues and agrees with the plan.  Clostridium Difficile by PCR

## 2012-03-26 LAB — CLOSTRIDIUM DIFFICILE BY PCR: Toxigenic C. Difficile by PCR: NOT DETECTED

## 2012-06-03 ENCOUNTER — Ambulatory Visit (INDEPENDENT_AMBULATORY_CARE_PROVIDER_SITE_OTHER): Payer: Medicare Other | Admitting: Family Medicine

## 2012-06-03 ENCOUNTER — Encounter: Payer: Self-pay | Admitting: Family Medicine

## 2012-06-03 VITALS — BP 130/72 | HR 94 | Temp 98.2°F | Wt 162.0 lb

## 2012-06-03 DIAGNOSIS — J329 Chronic sinusitis, unspecified: Secondary | ICD-10-CM

## 2012-06-03 MED ORDER — DOXYCYCLINE HYCLATE 100 MG PO CAPS: 100.0000 mg | ORAL_CAPSULE | Freq: Two times a day (BID) | ORAL | Status: DC

## 2012-06-03 NOTE — Progress Notes (Signed)
SUBJECTIVE:  Abigail Willis is a 67 y.o. female who complains of coryza, congestion, swollen glands, nasal blockage, post nasal drip and bilateral ear pain for 8days. She denies a history of anorexia, chest pain and chills and admits to a history of asthma. Patient admits to smoke cigarettes.   PMH significant for PCN and Avelox allergy  She is leaving for [facility] tomorrow.  Patient Active Problem List  Diagnosis  . HYPERLIPIDEMIA  . ALLERGIC RHINITIS  . Family history of ovarian cancer   Past Medical History  Diagnosis Date  . Hyperlipidemia   . Allergy     SINUS  . IBS (irritable bowel syndrome)    Past Surgical History  Procedure Date  . Tubal ligation   . Tonsillectomy   . Colonoscopy   . Ganglion cyst excision   . Hammer toe surgery   . Breast biopsy   . Polypectomy   . Leep      twice, in [place]   History  Substance Use Topics  . Smoking status: Current Every Day Smoker -- 1.0 packs/day    Types: Cigarettes  . Smokeless tobacco: Never Used  . Alcohol Use: 0.6 oz/week    1 Glasses of wine per week     Comment: SOCIAL   Family History  Problem Relation Age of Onset  . Cancer Mother   . Asthma Father   . Colon cancer Neg Hx    Allergies  Allergen Reactions  . Avelox (Moxifloxacin Hcl In Nacl) Other (See Comments)    Severe stomach cramping and nausea  . Penicillins     REACTION: rash   Current Outpatient Prescriptions on File Prior to Visit  Medication Sig Dispense Refill  . aspirin 81 MG tablet Take 81 mg by mouth daily.        . fluticasone (FLONASE) 50 MCG/ACT nasal spray Place 2 sprays into the nose daily.  16 g  1  . ibuprofen (ADVIL,MOTRIN) 200 MG tablet Take 200 mg by mouth as needed.        . pravastatin (PRAVACHOL) 10 MG tablet Take 1 tablet (10 mg total) by mouth every evening.  90 tablet  3  . pseudoephedrine-guaifenesin (MUCINEX D) 60-600 MG per tablet Take 1 tablet by mouth every 12 (twelve) hours as needed.         The PMH, PSH, Social History,  Family History, Medications, and allergies have been reviewed in [facility], and have been updated if relevant.  OBJECTIVE: She appears well, vital signs are as noted. TM dull bilaterally, erythematous, right > left  Throat and pharynx normal.  Neck supple. No adenopathy in the neck. Nose is congested. Sinuses  tender. The chest is clear, without wheezes or rales.  ASSESSMENT:  serous otitis and sinusitis  PLAN: Given duration and progression of symptoms, will treat for bacterial sinusitis.  Symptomatic therapy suggested: push fluids, rest and return office visit prn if symptoms persist or worsen. Call or return to clinic prn if these symptoms worsen or fail to improve as anticipated.

## 2012-06-03 NOTE — Patient Instructions (Addendum)
Good to see you. Ok to continue Mucinex-D/Sudafed for the fluid in your ears.  Take Doxycycline as directed- 1 tablet twice daily x 10 days.  Have fun in [place]!

## 2012-09-16 ENCOUNTER — Telehealth: Payer: Self-pay | Admitting: Family Medicine

## 2012-09-16 NOTE — Telephone Encounter (Signed)
Caller: Abigail Willis/Patient; Phone: [phone]; Reason for Call: Patient needs a refill of Pravastatin before her annual physical that is scheduled in June.  Patient currently has approximately 3 weeks of medication left, she will be going out of town during the end of May and will need a new refill before that time.  She normally uses mail order pharmacy: [facility], phone: [phone].  Please contact patient if you have any questions.

## 2012-09-17 MED ORDER — PRAVASTATIN SODIUM 10 MG PO TABS: 10.0000 mg | ORAL_TABLET | Freq: Every evening | ORAL | Status: DC

## 2012-09-28 ENCOUNTER — Ambulatory Visit (INDEPENDENT_AMBULATORY_CARE_PROVIDER_SITE_OTHER): Payer: Medicare Other | Admitting: Family Medicine

## 2012-09-28 ENCOUNTER — Encounter: Payer: Self-pay | Admitting: Family Medicine

## 2012-09-28 VITALS — BP 140/60 | HR 88 | Temp 99.2°F | Wt 158.0 lb

## 2012-09-28 DIAGNOSIS — H8110 Benign paroxysmal vertigo, unspecified ear: Secondary | ICD-10-CM | POA: Insufficient documentation

## 2012-09-28 MED ORDER — MECLIZINE HCL 12.5 MG PO TABS: 12.5000 mg | ORAL_TABLET | Freq: Three times a day (TID) | ORAL | Status: DC | PRN

## 2012-09-28 MED ORDER — DEXAMETHASONE SOD PHOSPHATE PF 10 MG/ML IJ SOLN
10.0000 mg | Freq: Once | INTRAMUSCULAR | Status: AC
  Administered 2012-09-28: 10 mg via INTRAMUSCULAR

## 2012-09-28 NOTE — Patient Instructions (Addendum)
Good to see you. Please take Meclizine as directed- 1 tablet three times daily for next 48 hours, ok to take as needed after that point. Please call me on Thursday with an update.  Vertigo Vertigo means you feel like you or your surroundings are moving when they are not. Vertigo can be dangerous if it occurs when you are at work, driving, or performing difficult activities.  CAUSES  Vertigo occurs when there is a conflict of signals sent to your brain from the visual and sensory systems in your body. There are many different causes of vertigo, including:  Infections, especially in the inner ear.  A bad reaction to a drug or misuse of alcohol and medicines.  Withdrawal from drugs or alcohol.  Rapidly changing positions, such as lying down or rolling over in bed.  A migraine headache.  Increased pressure in the brain from a head injury, infection, tumor, or bleeding. SYMPTOMS  You may feel as though the world is spinning around or you are falling to the ground. Because your balance is upset, vertigo can cause nausea and vomiting. You may have involuntary eye movements (nystagmus). DIAGNOSIS  Vertigo is usually diagnosed by physical exam. If the cause of your vertigo is unknown, your caregiver may perform imaging tests, such as an MRI scan (magnetic resonance imaging). TREATMENT  Most cases of vertigo resolve on their own, without treatment. Depending on the cause, your caregiver may prescribe certain medicines. If your vertigo is related to body position issues, your caregiver may recommend movements or procedures to correct the problem. In rare cases, if your vertigo is caused by certain inner ear problems, you may need surgery. HOME CARE INSTRUCTIONS   Follow your caregiver's instructions.  Avoid driving.  Avoid operating heavy machinery.  Avoid performing any tasks that would be dangerous to you or others during a vertigo episode.  Tell your caregiver if you notice that certain  medicines seem to be causing your vertigo. Some of the medicines used to treat vertigo episodes can actually make them worse in some people. SEEK IMMEDIATE MEDICAL CARE IF:   Your medicines do not relieve your vertigo or are making it worse.  You develop problems with talking, walking, weakness, or using your arms, hands, or legs.  You develop severe headaches.  Your nausea or vomiting continues or gets worse.  You develop visual changes.  A family member notices behavioral changes.  Your condition gets worse. MAKE SURE YOU:  Understand these instructions.  Will watch your condition.  Will get help right away if you are not doing well or get worse. Document Released: 02/12/2005 Document Revised: 07/28/2011 Document Reviewed: 11/21/2010 [facility] Patient Information 2013 [place], [place].

## 2012-09-28 NOTE — Progress Notes (Signed)
Subjective:    Patient ID: Abigail Willis, female    DOB: [birth year], 67 y.o.   MRN: [id number]  HPI  Very pleasant 67 yo female here for intermittent episodes of dizziness- on and off for past month.  Symptoms always occur when she changes head positions- usually when lying flat and turning her head.  Yesterday was at the dentist and they raised the chair and she became very dizzy- room was spinning.  When the checked her BP elevated to 166/90 which she has never had.  No CP or SOB.  No nausea or blurred vision. No slurred speech or other focal neurological deficits.  Has had several ear infections and worsening allergic rhinitis this year.  Patient Active Problem List   Diagnosis Date Noted  . Benign paroxysmal positional vertigo 09/28/2012  . Family history of ovarian cancer 03/14/2011  . HYPERLIPIDEMIA 03/06/2010  . ALLERGIC RHINITIS 03/06/2010   Past Medical History  Diagnosis Date  . Hyperlipidemia   . Allergy     SINUS  . IBS (irritable bowel syndrome)    Past Surgical History  Procedure Laterality Date  . Tubal ligation    . Tonsillectomy    . Colonoscopy    . Ganglion cyst excision    . Hammer toe surgery    . Breast biopsy    . Polypectomy    . Leep       twice, in [place]   History  Substance Use Topics  . Smoking status: Current Every Day Smoker -- 1.00 packs/day    Types: Cigarettes  . Smokeless tobacco: Never Used  . Alcohol Use: 0.6 oz/week    1 Glasses of wine per week     Comment: SOCIAL   Family History  Problem Relation Age of Onset  . Cancer Mother   . Asthma Father   . Colon cancer Neg Hx    Allergies  Allergen Reactions  . Avelox (Moxifloxacin Hcl In Nacl) Other (See Comments)    Severe stomach cramping and nausea  . Penicillins     REACTION: rash   Current Outpatient Prescriptions on File Prior to Visit  Medication Sig Dispense Refill  . aspirin 81 MG tablet Take 81 mg by mouth daily.        . fluticasone (FLONASE) 50 MCG/ACT nasal  spray Place 2 sprays into the nose daily.  16 g  1  . ibuprofen (ADVIL,MOTRIN) 200 MG tablet Take 200 mg by mouth as needed.        . pravastatin (PRAVACHOL) 10 MG tablet Take 1 tablet (10 mg total) by mouth every evening.  90 tablet  0  . pseudoephedrine-guaifenesin (MUCINEX D) 60-600 MG per tablet Take 1 tablet by mouth every 12 (twelve) hours as needed.         No current facility-administered medications on file prior to visit.   The PMH, PSH, Social History, Family History, Medications, and allergies have been reviewed in [facility], and have been updated if relevant.   Review of Systems See HPI No HA    Objective:   Physical Exam BP 140/60  Pulse 88  Temp(Src) 99.2 F (37.3 C)  Wt 158 lb (71.668 kg)  BMI 29.37 kg/m2  General:  Well-developed,well-nourished,in no acute distress; alert,appropriate and cooperative throughout examination Head:  normocephalic and atraumatic.   Eyes: dizziness elicited with lying flat and turning head to right, +nyastagmus Ears:  R ear- +fluid behind TM  and L ear normal.   Nose:  no external deformity.  Mouth:  good dentition.   Neck:  No deformities, masses, or tenderness noted. Lungs:  Normal respiratory effort, chest expands symmetrically. Lungs are clear to auscultation, no crackles or wheezes. Heart:  Normal rate and regular rhythm. S1 and S2 normal without gallop, murmur, click, rub or other extra sounds. Psych:  Cognition and judgment appear intact. Alert and cooperative with normal attention span and concentration. No apparent delusions, illusions, hallucinations        Assessment & Plan:  1. Benign paroxysmal positional vertigo Intermittent. GIven IM decadron to help with fluid in ear/inflammation. Treat with scheduled meclizine x 48 hours, she will call with update. Proceed with MRI/ vestibular rehab if no improvement.

## 2012-09-28 NOTE — Addendum Note (Signed)
Addended by: Eliezer Bottom on: 09/28/2012 11:25 AM   Modules accepted: Orders

## 2012-10-18 ENCOUNTER — Other Ambulatory Visit: Payer: Self-pay | Admitting: Family Medicine

## 2012-10-18 DIAGNOSIS — E785 Hyperlipidemia, unspecified: Secondary | ICD-10-CM

## 2012-10-25 ENCOUNTER — Encounter: Payer: Medicare Other | Admitting: Family Medicine

## 2012-10-26 ENCOUNTER — Encounter: Payer: Self-pay | Admitting: Family Medicine

## 2012-10-26 ENCOUNTER — Other Ambulatory Visit: Payer: Self-pay

## 2012-10-26 ENCOUNTER — Other Ambulatory Visit (INDEPENDENT_AMBULATORY_CARE_PROVIDER_SITE_OTHER): Payer: Medicare Other

## 2012-10-26 DIAGNOSIS — E785 Hyperlipidemia, unspecified: Secondary | ICD-10-CM

## 2012-10-26 DIAGNOSIS — Z1231 Encounter for screening mammogram for malignant neoplasm of breast: Secondary | ICD-10-CM

## 2012-10-26 LAB — CBC WITH DIFFERENTIAL/PLATELET
Basophils Absolute: 0 10*3/uL (ref 0.0–0.1)
HCT: 44 % (ref 36.0–46.0)
Hemoglobin: 15.1 g/dL — ABNORMAL HIGH (ref 12.0–15.0)
Lymphs Abs: 2.7 10*3/uL (ref 0.7–4.0)
MCV: 90.9 fl (ref 78.0–100.0)
Monocytes Absolute: 0.7 10*3/uL (ref 0.1–1.0)
Monocytes Relative: 8.7 % (ref 3.0–12.0)
Neutro Abs: 4.6 10*3/uL (ref 1.4–7.7)
Platelets: 265 10*3/uL (ref 150.0–400.0)
RDW: 12.9 % (ref 11.5–14.6)

## 2012-10-26 LAB — COMPREHENSIVE METABOLIC PANEL
ALT: 19 U/L (ref 0–35)
Alkaline Phosphatase: 69 U/L (ref 39–117)
Sodium: 141 mEq/L (ref 135–145)
Total Bilirubin: 0.5 mg/dL (ref 0.3–1.2)
Total Protein: 6.7 g/dL (ref 6.0–8.3)

## 2012-10-26 LAB — LIPID PANEL: HDL: 55.6 mg/dL (ref >39.00)

## 2012-11-02 ENCOUNTER — Other Ambulatory Visit: Payer: Self-pay | Admitting: Family Medicine

## 2012-11-02 ENCOUNTER — Other Ambulatory Visit (HOSPITAL_COMMUNITY)
Admission: RE | Admit: 2012-11-02 | Discharge: 2012-11-02 | Disposition: A | Discharge status: Home / Self Care | Payer: Medicare Other | Source: Ambulatory Visit | Attending: Family Medicine | Admitting: Family Medicine

## 2012-11-02 ENCOUNTER — Ambulatory Visit (INDEPENDENT_AMBULATORY_CARE_PROVIDER_SITE_OTHER): Payer: Medicare Other | Admitting: Family Medicine

## 2012-11-02 ENCOUNTER — Encounter: Payer: Self-pay | Admitting: Family Medicine

## 2012-11-02 VITALS — BP 110/70 | HR 84 | Temp 98.0°F | Ht 61.5 in | Wt 158.0 lb

## 2012-11-02 DIAGNOSIS — Z1331 Encounter for screening for depression: Secondary | ICD-10-CM

## 2012-11-02 DIAGNOSIS — E785 Hyperlipidemia, unspecified: Secondary | ICD-10-CM

## 2012-11-02 DIAGNOSIS — Z124 Encounter for screening for malignant neoplasm of cervix: Secondary | ICD-10-CM

## 2012-11-02 DIAGNOSIS — Z0000 Encounter for general adult medical examination without abnormal findings: Secondary | ICD-10-CM

## 2012-11-02 DIAGNOSIS — Z01419 Encounter for gynecological examination (general) (routine) without abnormal findings: Secondary | ICD-10-CM | POA: Insufficient documentation

## 2012-11-02 MED ORDER — PRAVASTATIN SODIUM 10 MG PO TABS: 10.0000 mg | ORAL_TABLET | Freq: Every evening | ORAL | Status: DC

## 2012-11-02 NOTE — Patient Instructions (Addendum)
Great to see you. Try some your dog's claritin.  Try Debrox drops over the counter in your right ear ( ok to use in both).  Have a wonderful summer!

## 2012-11-02 NOTE — Progress Notes (Signed)
67 yo pleasant female here for medicare annual wellness visit.  Doing well.  Just returned from [place] cruise!  I have personally reviewed the Medicare Annual Wellness questionnaire and have noted 1. The patient's medical and social history 2. Their use of alcohol, tobacco or illicit drugs 3. Their current medications and supplements 4. The patient's functional ability including ADL's, fall risks, home safety risks and hearing or visual             impairment. 5. Diet and physical activities 6. Evidence for depression or mood disorders  End of life wishes discussed and updated in Social History.  HLD- taking Pravachol 10 mg qhs. Lab Results  Component Value Date   CHOL 168 10/26/2012   HDL 55.60 10/26/2012   LDLCALC 87 10/26/2012   LDLDIRECT 116.2 03/14/2011   TRIG 129.0 10/26/2012   CHOLHDL 3 10/26/2012    UTD on prevention- due for mammogram next month (appt already scheduled).  Still smoking 1 ppd but thinking about cutting back.  Patient Active Problem List   Diagnosis Date Noted  . Routine general medical examination at a health care facility 11/02/2012  . Benign paroxysmal positional vertigo 09/28/2012  . Family history of ovarian cancer 03/14/2011  . HYPERLIPIDEMIA 03/06/2010  . ALLERGIC RHINITIS 03/06/2010   Past Medical History  Diagnosis Date  . Hyperlipidemia   . Allergy     SINUS  . IBS (irritable bowel syndrome)    Past Surgical History  Procedure Laterality Date  . Tubal ligation    . Tonsillectomy    . Colonoscopy    . Ganglion cyst excision    . Hammer toe surgery    . Breast biopsy    . Polypectomy    . Leep       twice, in [place]   History  Substance Use Topics  . Smoking status: Current Every Day Smoker -- 1.00 packs/day    Types: Cigarettes  . Smokeless tobacco: Never Used  . Alcohol Use: 0.6 oz/week    1 Glasses of wine per week     Comment: SOCIAL   Family History  Problem Relation Age of Onset  . Cancer Mother   . Asthma Father    . Colon cancer Neg Hx    Allergies  Allergen Reactions  . Avelox (Moxifloxacin Hcl In Nacl) Other (See Comments)    Severe stomach cramping and nausea  . Penicillins     REACTION: rash   Current Outpatient Prescriptions on File Prior to Visit  Medication Sig Dispense Refill  . aspirin 81 MG tablet Take 81 mg by mouth daily.        . fluticasone (FLONASE) 50 MCG/ACT nasal spray Place 2 sprays into the nose daily.  16 g  1  . ibuprofen (ADVIL,MOTRIN) 200 MG tablet Take 200 mg by mouth as needed.        . meclizine (ANTIVERT) 12.5 MG tablet Take 1 tablet (12.5 mg total) by mouth 3 (three) times daily as needed.  30 tablet  0  . pravastatin (PRAVACHOL) 10 MG tablet Take 1 tablet (10 mg total) by mouth every evening.  90 tablet  0  . pseudoephedrine-guaifenesin (MUCINEX D) 60-600 MG per tablet Take 1 tablet by mouth every 12 (twelve) hours as needed.         No current facility-administered medications on file prior to visit.   The PMH, PSH, Social History, Family History, Medications, and allergies have been reviewed in [facility], and have been updated if relevant.  ROS: See HPI Patient reports no  vision/ hearing changes,anorexia, weight change, fever ,adenopathy, persistant / recurrent hoarseness, swallowing issues, chest pain, edema,persistant / recurrent cough, hemoptysis, dyspnea(rest, exertional, paroxysmal nocturnal), gastrointestinal  bleeding (melena, rectal bleeding), abdominal pain, excessive heart burn, GU symptoms(dysuria, hematuria, pyuria, voiding/incontinence  Issues) syncope, focal weakness, severe memory loss, concerning skin lesions, depression, anxiety, abnormal bruising/bleeding, major joint swelling, breast masses or abnormal vaginal bleeding.    Physical exam: BP 110/70  Pulse 84  Temp(Src) 98 F (36.7 C)  Ht 5' 1.5" (1.562 m)  Wt 158 lb (71.668 kg)  BMI 29.37 kg/m2   General:  Well-developed,well-nourished,in no acute distress; alert,appropriate and cooperative  throughout examination Head:  normocephalic and atraumatic.   Eyes:  vision grossly intact, pupils equal, pupils round, and pupils reactive to light.   Ears:  R ear normal and L ear normal.   Nose:  no external deformity.   Mouth:  good dentition.   Neck:  No deformities, masses, or tenderness noted. Breasts:  No mass, nodules, thickening, tenderness, bulging, retraction, inflamation, nipple discharge or skin changes noted.   Lungs:  Normal respiratory effort, chest expands symmetrically. Lungs are clear to auscultation, no crackles or wheezes. Heart:  Normal rate and regular rhythm. S1 and S2 normal without gallop, murmur, click, rub or other extra sounds. Abdomen:  Bowel sounds positive,abdomen soft and non-tender without masses, organomegaly or hernias noted. Rectal:  no external abnormalities.   Genitalia:  Pelvic Exam:        External: normal female genitalia without lesions or masses        Vagina: normal without lesions or masses        Cervix: normal without lesions or masses        Adnexa: normal bimanual exam without masses or fullness        Uterus: normal by palpation       Msk:  No deformity or scoliosis noted of thoracic or lumbar spine.   Extremities:  No clubbing, cyanosis, edema, or deformity noted with normal full range of motion of all joints.   Neurologic:  alert & oriented X3 and gait normal.   Skin:  Intact without suspicious lesions or rashes Cervical Nodes:  No lymphadenopathy noted Axillary Nodes:  No palpable lymphadenopathy Psych:  Cognition and judgment appear intact. Alert and cooperative with normal attention span and concentration. No apparent delusions, illusions, hallucinations   Assessment and Plan: 1. HYPERLIPIDEMIA  Cholesterol fantastic this month. No changes to pravachol dose.  2. Routine general medical examination at a health care facility  The patients weight, height, BMI and visual acuity have been recorded in the chart I have made  referrals, counseling and provided education to the patient based review of the above and I have provided the pt with a written personalized care plan for preventive services.  3. Routine GYN exam-  Pap and GYn performed due to family history.

## 2012-11-02 NOTE — Addendum Note (Signed)
Addended by: Eliezer Bottom on: 11/02/2012 10:49 AM   Modules accepted: Orders

## 2012-11-03 ENCOUNTER — Encounter: Payer: Self-pay | Admitting: Family Medicine

## 2012-12-08 ENCOUNTER — Ambulatory Visit: Payer: Medicare Other

## 2012-12-16 ENCOUNTER — Ambulatory Visit
Admission: RE | Admit: 2012-12-16 | Discharge: 2012-12-16 | Disposition: A | Discharge status: Home / Self Care | Payer: Medicare Other | Source: Ambulatory Visit

## 2012-12-16 DIAGNOSIS — Z1231 Encounter for screening mammogram for malignant neoplasm of breast: Secondary | ICD-10-CM

## 2013-02-14 ENCOUNTER — Encounter: Payer: Self-pay | Admitting: Family Medicine

## 2013-02-14 ENCOUNTER — Ambulatory Visit (INDEPENDENT_AMBULATORY_CARE_PROVIDER_SITE_OTHER): Payer: Medicare Other | Admitting: Family Medicine

## 2013-02-14 VITALS — BP 126/78 | HR 82 | Temp 98.6°F | Ht 61.5 in | Wt 160.8 lb

## 2013-02-14 DIAGNOSIS — H9209 Otalgia, unspecified ear: Secondary | ICD-10-CM

## 2013-02-14 DIAGNOSIS — Z23 Encounter for immunization: Secondary | ICD-10-CM

## 2013-02-14 DIAGNOSIS — H9201 Otalgia, right ear: Secondary | ICD-10-CM

## 2013-02-14 DIAGNOSIS — R600 Localized edema: Secondary | ICD-10-CM | POA: Insufficient documentation

## 2013-02-14 DIAGNOSIS — I83893 Varicose veins of bilateral lower extremities with other complications: Secondary | ICD-10-CM

## 2013-02-14 NOTE — Progress Notes (Signed)
Subjective:    Patient ID: Abigail Willis, female    DOB: 07/28/1945, 67 y.o.   MRN: [id number]  HPI  Very pleasant 67 yo smoker here for intermittent left leg pain.  Was on a cruise a couple of weeks ago and noticed that her varicose veins on left leg were painful. Also had some mild edema of left leg. On CP or SOB. No LE redness or warmth.    Has had varicose veins for years but they have been painful at times but never associated with LE edema.  Takes ASA 81 mg daily.  Woke up with right ear pain today.  No other URI symptoms.  Patient Active Problem List   Diagnosis Date Noted  . Varicose veins of lower extremities with other complications 02/14/2013  . Otalgia of right ear 02/14/2013  . Routine general medical examination at a health care facility 11/02/2012  . Benign paroxysmal positional vertigo 09/28/2012  . Family history of ovarian cancer 03/14/2011  . HYPERLIPIDEMIA 03/06/2010  . ALLERGIC RHINITIS 03/06/2010   Past Medical History  Diagnosis Date  . Hyperlipidemia   . Allergy     SINUS  . IBS (irritable bowel syndrome)    Past Surgical History  Procedure Laterality Date  . Tubal ligation    . Tonsillectomy    . Colonoscopy    . Ganglion cyst excision    . Hammer toe surgery    . Breast biopsy    . Polypectomy    . Leep       twice, in [place]   History  Substance Use Topics  . Smoking status: Current Every Day Smoker -- 1.00 packs/day    Types: Cigarettes  . Smokeless tobacco: Never Used  . Alcohol Use: 0.6 oz/week    1 Glasses of wine per week     Comment: SOCIAL   Family History  Problem Relation Age of Onset  . Cancer Mother   . Asthma Father   . Colon cancer Neg Hx    Allergies  Allergen Reactions  . Avelox [Moxifloxacin Hcl In Nacl] Other (See Comments)    Severe stomach cramping and nausea  . Penicillins     REACTION: rash   Current Outpatient Prescriptions on File Prior to Visit  Medication Sig Dispense Refill  . aspirin 81 MG  tablet Take 81 mg by mouth daily.        Marland Kitchen ibuprofen (ADVIL,MOTRIN) 200 MG tablet Take 200 mg by mouth as needed.        . meclizine (ANTIVERT) 12.5 MG tablet Take 1 tablet (12.5 mg total) by mouth 3 (three) times daily as needed.  30 tablet  0  . pravastatin (PRAVACHOL) 10 MG tablet Take 1 tablet (10 mg total) by mouth every evening.  90 tablet  1  . pseudoephedrine-guaifenesin (MUCINEX D) 60-600 MG per tablet Take 1 tablet by mouth every 12 (twelve) hours as needed.         No current facility-administered medications on file prior to visit.   The PMH, PSH, Social History, Family History, Medications, and allergies have been reviewed in [facility], and have been updated if relevant.   Review of Systems    See HPI Objective:   Physical Exam  Constitutional: She appears well-developed and well-nourished. No distress.  HENT:  Right Ear: Ear canal normal. No drainage or swelling. A middle ear effusion is present. No decreased hearing is noted.  Left Ear: Hearing, tympanic membrane, external ear and ear canal normal.  Cardiovascular:  Does have large varicose veins on left leg- non tender, no erythema or warmth. No LE edema  Skin: Skin is warm, dry and intact.  Psychiatric: She has a normal mood and affect. Her speech is normal and behavior is normal. Judgment and thought content normal. Cognition and memory are normal.   BP 126/78  Pulse 82  Temp(Src) 98.6 F (37 C) (Oral)  Ht 5' 1.5" (1.562 m)  Wt 160 lb 12.8 oz (72.938 kg)  BMI 29.89 kg/m2  SpO2 94%       Assessment & Plan:  1. Varicose veins of lower extremities with other complications Now with intermittent pain. Edema has resolved and exam otherwise unremarkable. Will refer to vein clinic for evaluation and tx. No indication of DVT currently. - Ambulatory referral to Vascular Surgery  2. Otalgia of right ear ETD. Advised continued decongestant, add Nasocort.

## 2013-02-14 NOTE — Patient Instructions (Addendum)
Good to see you. We will call you with your vein specialist appointment.  Try over the counter nasocort-start with 2 sprays per nostril per day...and then try to taper to 1 spray per nostril once symptoms improve.

## 2013-03-11 ENCOUNTER — Encounter: Payer: Medicare Other | Admitting: Vascular Surgery

## 2013-03-11 ENCOUNTER — Encounter (HOSPITAL_COMMUNITY): Payer: Medicare Other

## 2013-05-25 ENCOUNTER — Other Ambulatory Visit: Payer: Self-pay

## 2013-05-25 MED ORDER — PRAVASTATIN SODIUM 10 MG PO TABS: 10.0000 mg | ORAL_TABLET | Freq: Every evening | ORAL | Status: DC

## 2013-05-25 NOTE — Telephone Encounter (Signed)
Pt request refill pravastatin to [facility]. Advised pt done

## 2013-11-03 ENCOUNTER — Other Ambulatory Visit: Payer: Self-pay | Admitting: Family Medicine

## 2013-11-03 DIAGNOSIS — Z0000 Encounter for general adult medical examination without abnormal findings: Secondary | ICD-10-CM

## 2013-11-03 DIAGNOSIS — E785 Hyperlipidemia, unspecified: Secondary | ICD-10-CM

## 2013-11-09 ENCOUNTER — Other Ambulatory Visit: Payer: Self-pay

## 2013-11-09 DIAGNOSIS — Z1231 Encounter for screening mammogram for malignant neoplasm of breast: Secondary | ICD-10-CM

## 2013-11-14 DIAGNOSIS — K635 Polyp of colon: Secondary | ICD-10-CM

## 2013-11-14 HISTORY — DX: Polyp of colon: K63.5

## 2013-11-16 ENCOUNTER — Other Ambulatory Visit (INDEPENDENT_AMBULATORY_CARE_PROVIDER_SITE_OTHER): Payer: Medicare Other

## 2013-11-16 DIAGNOSIS — Z0000 Encounter for general adult medical examination without abnormal findings: Secondary | ICD-10-CM

## 2013-11-16 DIAGNOSIS — E785 Hyperlipidemia, unspecified: Secondary | ICD-10-CM

## 2013-11-16 LAB — LIPID PANEL
CHOLESTEROL: 199 mg/dL (ref 0–200)
HDL: 72.4 mg/dL (ref >39.00)
LDL CALC: 97 mg/dL (ref 0–99)
NonHDL: 126.6
TRIGLYCERIDES: 146 mg/dL (ref 0.0–149.0)
Total CHOL/HDL Ratio: 3
VLDL: 29.2 mg/dL (ref 0.0–40.0)

## 2013-11-16 LAB — CBC WITH DIFFERENTIAL/PLATELET
Basophils Absolute: 0 10*3/uL (ref 0.0–0.1)
Basophils Relative: 0.5 % (ref 0.0–3.0)
EOS PCT: 4.8 % (ref 0.0–5.0)
Eosinophils Absolute: 0.4 10*3/uL (ref 0.0–0.7)
HCT: 46.8 % — ABNORMAL HIGH (ref 36.0–46.0)
Hemoglobin: 16 g/dL — ABNORMAL HIGH (ref 12.0–15.0)
LYMPHS ABS: 2.9 10*3/uL (ref 0.7–4.0)
Lymphocytes Relative: 31.2 % (ref 12.0–46.0)
MCHC: 34.2 g/dL (ref 30.0–36.0)
MCV: 90.8 fl (ref 78.0–100.0)
MONO ABS: 0.7 10*3/uL (ref 0.1–1.0)
MONOS PCT: 7.6 % (ref 3.0–12.0)
NEUTROS PCT: 55.9 % (ref 43.0–77.0)
Neutro Abs: 5.2 10*3/uL (ref 1.4–7.7)
Platelets: 302 10*3/uL (ref 150.0–400.0)
RBC: 5.15 Mil/uL — AB (ref 3.87–5.11)
RDW: 13.3 % (ref 11.5–15.5)
WBC: 9.4 10*3/uL (ref 4.0–10.5)

## 2013-11-16 LAB — COMPREHENSIVE METABOLIC PANEL
ALBUMIN: 4 g/dL (ref 3.5–5.2)
ALT: 20 U/L (ref 0–35)
AST: 22 U/L (ref 0–37)
Alkaline Phosphatase: 70 U/L (ref 39–117)
BUN: 10 mg/dL (ref 6–23)
CO2: 30 meq/L (ref 19–32)
Calcium: 9.2 mg/dL (ref 8.4–10.5)
Chloride: 104 mEq/L (ref 96–112)
Creatinine, Ser: 0.7 mg/dL (ref 0.4–1.2)
GFR: 88.31 mL/min (ref >60.00)
GLUCOSE: 110 mg/dL — AB (ref 70–99)
POTASSIUM: 4.8 meq/L (ref 3.5–5.1)
Sodium: 140 mEq/L (ref 135–145)
Total Bilirubin: 0.5 mg/dL (ref 0.2–1.2)
Total Protein: 7.1 g/dL (ref 6.0–8.3)

## 2013-11-16 LAB — TSH: TSH: 0.4 u[IU]/mL (ref 0.35–4.50)

## 2013-11-21 ENCOUNTER — Encounter: Payer: Self-pay | Admitting: Family Medicine

## 2013-11-21 ENCOUNTER — Telehealth: Payer: Self-pay | Admitting: Family Medicine

## 2013-11-21 ENCOUNTER — Ambulatory Visit (INDEPENDENT_AMBULATORY_CARE_PROVIDER_SITE_OTHER): Payer: Medicare Other | Admitting: Family Medicine

## 2013-11-21 VITALS — BP 128/80 | HR 75 | Temp 98.7°F | Ht 61.0 in | Wt 159.5 lb

## 2013-11-21 DIAGNOSIS — H9201 Otalgia, right ear: Secondary | ICD-10-CM

## 2013-11-21 DIAGNOSIS — Z0000 Encounter for general adult medical examination without abnormal findings: Secondary | ICD-10-CM

## 2013-11-21 DIAGNOSIS — R109 Unspecified abdominal pain: Secondary | ICD-10-CM | POA: Insufficient documentation

## 2013-11-21 DIAGNOSIS — E785 Hyperlipidemia, unspecified: Secondary | ICD-10-CM

## 2013-11-21 DIAGNOSIS — R7309 Other abnormal glucose: Secondary | ICD-10-CM

## 2013-11-21 DIAGNOSIS — Z23 Encounter for immunization: Secondary | ICD-10-CM

## 2013-11-21 DIAGNOSIS — R739 Hyperglycemia, unspecified: Secondary | ICD-10-CM

## 2013-11-21 DIAGNOSIS — H9209 Otalgia, unspecified ear: Secondary | ICD-10-CM

## 2013-11-21 DIAGNOSIS — F17200 Nicotine dependence, unspecified, uncomplicated: Secondary | ICD-10-CM

## 2013-11-21 LAB — HEMOGLOBIN A1C: HEMOGLOBIN A1C: 5.5 % (ref 4.6–6.5)

## 2013-11-21 MED ORDER — DOXYCYCLINE HYCLATE 100 MG PO TABS: 100.0000 mg | ORAL_TABLET | Freq: Two times a day (BID) | ORAL | Status: DC

## 2013-11-21 MED ORDER — PRAVASTATIN SODIUM 10 MG PO TABS: 10.0000 mg | ORAL_TABLET | Freq: Every evening | ORAL | Status: DC

## 2013-11-21 NOTE — Assessment & Plan Note (Signed)
Probable IBS. Advised to keep a symptoms journal. If symptoms deteriorate, refer back to GI. The patient indicates understanding of these issues and agrees with the plan.

## 2013-11-21 NOTE — Assessment & Plan Note (Signed)
Given duration and progression of symptoms, will treat for bacterial sinusitis/otitis. PCN allergic. Doxycyline 100 mg twice daily x 10 days Continue supportive care.

## 2013-11-21 NOTE — Assessment & Plan Note (Signed)
a1c today 

## 2013-11-21 NOTE — Addendum Note (Signed)
Addended by: Modena Nunnery on: 11/21/2013 05:00 PM   Modules accepted: Orders

## 2013-11-21 NOTE — Progress Notes (Signed)
68 yo pleasant female here for medicare annual wellness visit.   I have personally reviewed the Medicare Annual Wellness questionnaire and have noted 1. The patient's medical and social history 2. Their use of alcohol, tobacco or illicit drugs 3. Their current medications and supplements 4. The patient's functional ability including ADL's, fall risks, home safety risks and hearing or visual             impairment. 5. Diet and physical activities 6. Evidence for depression or mood disorders  Mammogram 12/20/2012 Pap smear 11/02/12 (done by me). Colonoscopy 11/15/10- Dr. Ardis Hughs, 5 year recall- polyps. Td 09/03/10 Pneumovax 09/03/2010 Zoster 08/22/09  End of life wishes discussed and updated in Social History.  The roster of all physicians providing medical care to patient - is listed in the Snapshot section of the chart.  Hyperglycemia- fasting CBG on CMET 110.  She was fasting.  Denies any increased thirst or urination. IBS- increased bloating and lower abdominal pain-usually occurs for a few days at a time.  No blood in stool although does have h/o recurrent bleeding hemorrhoids.  Align and Gas X seem to help. Colonoscopy UTD.  Right ear pain- worsening for past 2 weeks.  Sinus pressure, HA worsening as well. Taking OTC decongestants and antihistamines. Does not like nasal sprays.  HLD- taking Pravachol 10 mg qhs. Lab Results  Component Value Date   CHOL 199 11/16/2013   HDL 72.40 11/16/2013   LDLCALC 97 11/16/2013   LDLDIRECT 116.2 03/14/2011   TRIG 146.0 11/16/2013   CHOLHDL 3 11/16/2013   Still smoking 1 ppd but thinking about cutting back.  Patient Active Problem List   Diagnosis Date Noted  . Medicare annual wellness visit, subsequent 11/21/2013  . Abdominal pain, unspecified site 11/21/2013  . Varicose veins of lower extremities with other complications 75/02/2584  . Otalgia of right ear 02/14/2013  . Routine general medical examination at a health care facility 11/02/2012  .  Benign paroxysmal positional vertigo 09/28/2012  . Family history of ovarian cancer 03/14/2011  . HYPERLIPIDEMIA 03/06/2010  . ALLERGIC RHINITIS 03/06/2010   Past Medical History  Diagnosis Date  . Hyperlipidemia   . Allergy     SINUS  . IBS (irritable bowel syndrome)    Past Surgical History  Procedure Laterality Date  . Tubal ligation    . Tonsillectomy    . Colonoscopy    . Ganglion cyst excision    . Hammer toe surgery    . Breast biopsy    . Polypectomy    . Leep       twice, in [place]   History  Substance Use Topics  . Smoking status: Current Every Day Smoker -- 1.00 packs/day    Types: Cigarettes  . Smokeless tobacco: Never Used  . Alcohol Use: 0.6 oz/week    1 Glasses of wine per week     Comment: SOCIAL   Family History  Problem Relation Age of Onset  . Cancer Mother   . Asthma Father   . Colon cancer Neg Hx    Allergies  Allergen Reactions  . Avelox [Moxifloxacin Hcl In Nacl] Other (See Comments)    Severe stomach cramping and nausea  . Penicillins     REACTION: rash   Current Outpatient Prescriptions on File Prior to Visit  Medication Sig Dispense Refill  . aspirin 81 MG tablet Take 81 mg by mouth daily.        Marland Kitchen ibuprofen (ADVIL,MOTRIN) 200 MG tablet Take 200 mg by  mouth as needed.        . meclizine (ANTIVERT) 12.5 MG tablet Take 1 tablet (12.5 mg total) by mouth 3 (three) times daily as needed.  30 tablet  0  . pseudoephedrine-guaifenesin (MUCINEX D) 60-600 MG per tablet Take 1 tablet by mouth every 12 (twelve) hours as needed.         No current facility-administered medications on file prior to visit.   The PMH, PSH, Social History, Family History, Medications, and allergies have been reviewed in [facility], and have been updated if relevant.  ROS: See HPI Patient reports no  vision/ hearing changes,anorexia, weight change, fever ,adenopathy, persistant / recurrent hoarseness, swallowing issues, chest pain, edema,persistant / recurrent cough,  hemoptysis, dyspnea(rest, exertional, paroxysmal nocturnal), gastrointestinal  bleeding (melena, rectal bleeding), abdominal pain, excessive heart burn, GU symptoms(dysuria, hematuria, pyuria, voiding/incontinence  Issues) syncope, focal weakness, severe memory loss, concerning skin lesions, depression, anxiety, abnormal bruising/bleeding, major joint swelling, breast masses or abnormal vaginal bleeding.    Physical exam: BP 128/80  Pulse 75  Temp(Src) 98.7 F (37.1 C) (Oral)  Ht 5\' 1"  (1.549 m)  Wt 159 lb 8 oz (72.349 kg)  BMI 30.15 kg/m2  SpO2 95%   General:  Well-developed,well-nourished,in no acute distress; alert,appropriate and cooperative throughout examination Head:  normocephalic and atraumatic.   Eyes:  vision grossly intact, pupils equal, pupils round, and pupils reactive to light.   Ears:   Right TM dull, erythematous Left TM normal Nose:  no external deformity.   +frontal sinuses TTP bilaterally Mouth:  good dentition.   Neck:  No deformities, masses, or tenderness noted. Lungs:  Normal respiratory effort, chest expands symmetrically. Lungs are clear to auscultation, no crackles or wheezes. Heart:  Normal rate and regular rhythm. S1 and S2 normal without gallop, murmur, click, rub or other extra sounds. Abdomen:  Bowel sounds positive,abdomen soft and non-tender without masses, organomegaly or hernias noted. Msk:  No deformity or scoliosis noted of thoracic or lumbar spine.   Extremities:  No clubbing, cyanosis, edema, or deformity noted with normal full range of motion of all joints.   Neurologic:  alert & oriented X3 and gait normal.   Skin:  Intact without suspicious lesions or rashes Cervical Nodes:  No lymphadenopathy noted Axillary Nodes:  No palpable lymphadenopathy Psych:  Cognition and judgment appear intact. Alert and cooperative with normal attention span and concentration. No apparent delusions, illusions, hallucinations

## 2013-11-21 NOTE — Progress Notes (Signed)
Pre visit review using our clinic review tool, if applicable. No additional management support is needed unless otherwise documented below in the visit note. 

## 2013-11-21 NOTE — Patient Instructions (Addendum)
Good to see you. Continue Align, Gas X.  Keep a journal of your GI symptoms- you can call or email in a couple of weeks- as for [facility] if you call.  Take doxycycline as directed- 1 tablet twice daily for 10 days.  Continue using your decongestants/anti histamines.  We will call you with your lab result from today as well as your low dose chest CT for lung cancer screening.

## 2013-11-21 NOTE — Telephone Encounter (Signed)
Relevant patient education mailed to patient.  

## 2013-11-21 NOTE — Assessment & Plan Note (Signed)
Not ready to quit. She does agree to low dose lung CT for cancer screening. Referral placed.

## 2013-11-21 NOTE — Assessment & Plan Note (Signed)
Continue current rx.

## 2013-11-21 NOTE — Assessment & Plan Note (Signed)
The patients weight, height, BMI and visual acuity have been recorded in the chart I have made referrals, counseling and provided education to the patient based review of the above and I have provided the pt with a written personalized care plan for preventive services.  prevnar 13 today.

## 2013-11-24 ENCOUNTER — Ambulatory Visit (INDEPENDENT_AMBULATORY_CARE_PROVIDER_SITE_OTHER)
Admission: RE | Admit: 2013-11-24 | Discharge: 2013-11-24 | Disposition: A | Discharge status: Home / Self Care | Payer: Medicare Other | Source: Ambulatory Visit | Attending: Family Medicine | Admitting: Family Medicine

## 2013-11-24 DIAGNOSIS — F17200 Nicotine dependence, unspecified, uncomplicated: Secondary | ICD-10-CM

## 2013-12-27 ENCOUNTER — Ambulatory Visit: Payer: Medicare Other

## 2013-12-28 ENCOUNTER — Ambulatory Visit
Admission: RE | Admit: 2013-12-28 | Discharge: 2013-12-28 | Disposition: A | Discharge status: Home / Self Care | Payer: Medicare Other | Source: Ambulatory Visit

## 2013-12-28 DIAGNOSIS — Z1231 Encounter for screening mammogram for malignant neoplasm of breast: Secondary | ICD-10-CM

## 2013-12-29 ENCOUNTER — Ambulatory Visit (INDEPENDENT_AMBULATORY_CARE_PROVIDER_SITE_OTHER): Payer: Medicare Other | Admitting: Podiatrist

## 2013-12-29 ENCOUNTER — Encounter: Payer: Self-pay | Admitting: Podiatrist

## 2013-12-29 ENCOUNTER — Ambulatory Visit (INDEPENDENT_AMBULATORY_CARE_PROVIDER_SITE_OTHER): Payer: Medicare Other

## 2013-12-29 VITALS — BP 133/71 | HR 79 | Resp 16 | Ht 61.0 in | Wt 160.0 lb

## 2013-12-29 DIAGNOSIS — M722 Plantar fascial fibromatosis: Secondary | ICD-10-CM

## 2013-12-29 DIAGNOSIS — M7750 Other enthesopathy of unspecified foot: Secondary | ICD-10-CM

## 2013-12-29 DIAGNOSIS — M7741 Metatarsalgia, right foot: Secondary | ICD-10-CM

## 2013-12-29 DIAGNOSIS — M2040 Other hammer toe(s) (acquired), unspecified foot: Secondary | ICD-10-CM

## 2013-12-29 DIAGNOSIS — M2041 Other hammer toe(s) (acquired), right foot: Secondary | ICD-10-CM

## 2013-12-29 NOTE — Patient Instructions (Signed)

## 2013-12-29 NOTE — Progress Notes (Signed)
   Subjective:    Patient ID: Abigail Willis, female    DOB: [birth year], 68 y.o.   MRN: [id number]  HPI Comments: i have left heel pain for the last month. i have hammertoes on my 2nd and 3rd toes on my rt foot that's been bothering me for 1 - 2 weeks and the top of my rt foot hurts. Both of my ankles have been bothering me off and on for 5 - 6 years. My feet have remained the same. If i do excessive walking the hammer toes hurt. i take tylenol for my feet  Foot Pain Associated symptoms include abdominal pain and headaches.      Review of Systems  HENT:       Sinus problems  Gastrointestinal: Positive for abdominal pain, diarrhea and constipation.       Bloating  Neurological: Positive for headaches.  Hematological: Bruises/bleeds easily.  All other systems reviewed and are negative.      Objective:   Physical Exam Patient is awake, alert, and oriented x 3.  In no acute distress.  Vascular status is intact with palpable pedal pulses at 2/4 DP and PT bilateral and capillary refill time within normal limits. Neurological sensation is also intact bilaterally via Semmes Weinstein monofilament at 5/5 sites. Light touch, vibratory sensation, Achilles tendon reflex is intact. Dermatological exam reveals skin color, turger and texture as normal. No open lesions present.  Musculature intact with dorsiflexion, plantarflexion, inversion, eversion. Patient has bunion deformities bilateral with elevated 2nd toe left and fused pipj from previous surgery.  2nd digit right is contracted at the pipj as well.  Tenderness at the base of the toes and met heads present.  Elongated digits are present bilateral.  Pain on palpation left heel at the insertion of the plantar fascia on the medial calcaneal tubercle present.  Very thin foot type is present.  X-rays confirm bunion deformities, elongated digits, hammertoe contracture and arthritic changes at the midfoot.     Assessment & Plan:  Plantar fascitis left,  bunion deformity bilateral, hammertoe deformity right, metatarsalgia  Plan: Discussed conservative versus surgical treatment options and alternatives. Discussed anti-inflammatory medications and stretching exercises for the plantar fasciitis as well as orthotics to treat both the heel pain and metatarsal discomfort. She was scanned for orthotics at today's visit and will be notified when these are ready for pick up.  We briefly discussed surgical correction and I recommended a bunion correction to get the great toe off of the second toe as well as a hammertoe fusion. She related that she was happy with the simple fusion of the left second toe even though it still is elevated. In the future she may want to revisit a surgical discussion however we will try the orthotics first.

## 2014-01-16 ENCOUNTER — Encounter: Payer: Self-pay | Admitting: Gastroenterology

## 2014-01-16 ENCOUNTER — Telehealth: Payer: Self-pay | Admitting: Podiatrist

## 2014-01-16 NOTE — Telephone Encounter (Signed)
Notified the patient that orthotics are in and scheduled an appointment.

## 2014-01-31 ENCOUNTER — Ambulatory Visit: Payer: Medicare Other | Admitting: Podiatry

## 2014-02-01 ENCOUNTER — Ambulatory Visit (INDEPENDENT_AMBULATORY_CARE_PROVIDER_SITE_OTHER): Payer: Medicare Other | Admitting: Gastroenterology

## 2014-02-01 ENCOUNTER — Encounter: Payer: Self-pay | Admitting: Gastroenterology

## 2014-02-01 VITALS — BP 124/74 | HR 81 | Ht 61.0 in | Wt 160.2 lb

## 2014-02-01 DIAGNOSIS — K589 Irritable bowel syndrome without diarrhea: Secondary | ICD-10-CM

## 2014-02-01 MED ORDER — HYOSCYAMINE SULFATE ER 0.375 MG PO TB12: 0.3750 mg | ORAL_TABLET | Freq: Two times a day (BID) | ORAL | Status: DC

## 2014-02-01 NOTE — Patient Instructions (Addendum)
One of your biggest health concerns is your smoking.  This increases your risk for most cancers and serious cardiovascular diseases such as strokes, heart attacks.  You should try your best to stop.  If you need assistance, please contact your PCP or Smoking Cessation Class at [facility] [phone]) or [place] (1-800-QUIT-NOW). Trial of antispasm meds, one pill twice daily. Call in 3-4 weeks to report on your response.

## 2014-02-01 NOTE — Progress Notes (Signed)
Review of pertinent gastrointestinal problems: 1. Adenomatous polyps; 10/2010 Colonoscopy Ardis Hughs, four small polyps )at least 2 were adenomas), colonsocopy 2008 with adenomas,  and 1998 [place] 1cm pedunculated TA; next recall examination 10/2015  HPI: This is a   very pleasant 68 year old woman whom I last saw 3-4 years ago.  Has had IBS for a long time, per her. Has gotten worse lately.  Terrible cramping.  Can have no problems for many days and then will have cramping with loose stools.  Will usually have 2-3 BMs in morning.  Can have intermittent frequency, does not see bleeding.    EAting greasy foods bring this on.  Nuts bring on cramping.  Overall stable weight.  GB in place.  Drinks 2-3 coffee in AM, sodas in PM, decaf coffee at night.  Rare constipation, pushing straining.  Takes align daily.    Gas ex can be helpful.   Review of systems: Pertinent positive and negative review of systems were noted in the above HPI section. Complete review of systems was performed and was otherwise normal.    Past Medical History  Diagnosis Date  . Hyperlipidemia   . Allergy     SINUS  . IBS (irritable bowel syndrome)     Past Surgical History  Procedure Laterality Date  . Tubal ligation    . Tonsillectomy    . Colonoscopy    . Ganglion cyst excision    . Hammer toe surgery    . Breast biopsy    . Polypectomy    . Leep       twice, in [place]    Current Outpatient Prescriptions  Medication Sig Dispense Refill  . aspirin 81 MG tablet Take 81 mg by mouth daily.        Marland Kitchen ibuprofen (ADVIL,MOTRIN) 200 MG tablet Take 200 mg by mouth as needed.        . pravastatin (PRAVACHOL) 10 MG tablet Take 1 tablet (10 mg total) by mouth every evening.  90 tablet  1  . pseudoephedrine-guaifenesin (MUCINEX D) 60-600 MG per tablet Take 1 tablet by mouth every 12 (twelve) hours as needed.         No current facility-administered medications for this visit.    Allergies as of 02/01/2014 -  Review Complete 02/01/2014  Allergen Reaction Noted  . Avelox [moxifloxacin hcl in nacl] Other (See Comments) 03/16/2012  . Penicillins  03/06/2010    Family History  Problem Relation Age of Onset  . Cancer Mother   . Asthma Father   . Colon cancer Neg Hx     History   Social History  . Marital Status: Married    Spouse Name: N/A    Number of Children: 1  . Years of Education: N/A   Occupational History  . Retired    Social History Main Topics  . Smoking status: Current Every Day Smoker -- 1.00 packs/day    Types: Cigarettes  . Smokeless tobacco: Never Used  . Alcohol Use: 0.6 oz/week    1 Glasses of wine per week     Comment: SOCIAL  . Drug Use: No  . Sexual Activity: Not on file   Other Topics Concern  . Not on file   Social History Narrative   Moved from [place] to be closer to her daughter, spends winters at [facility] home.      Has HPOA- husband Cletus Gash.   Would desire feeding tube, no prolonged life support.   Not sure about feeding tubes.  Physical Exam: BP 124/74  Pulse 81  Ht 5\' 1"  (1.549 m)  Wt 160 lb 4 oz (72.689 kg)  BMI 30.29 kg/m2  SpO2 97% Constitutional: generally well-appearing Psychiatric: alert and oriented x3 Eyes: extraocular movements intact Mouth: oral pharynx moist, no lesions Neck: supple no lymphadenopathy Cardiovascular: heart regular rate and rhythm Lungs: clear to auscultation bilaterally Abdomen: soft, nontender, nondistended, no obvious ascites, no peritoneal signs, normal bowel sounds Extremities: no lower extremity edema bilaterally Skin: no lesions on visible extremities    Assessment and plan: 68 y.o. female with  intermittent cramping and loose stools that very likely is IBS related.  She has not been losing weight, her symptoms are chronic although worse lately. I don't think she requires endoscopic evaluation. Recent CBC and complete metabolic profile are essentially normal. I'm going  to start her on antispasmodic twice-daily therapy. She will call to report on her response in 3-4 weeks and sooner if needed

## 2014-02-07 ENCOUNTER — Ambulatory Visit (INDEPENDENT_AMBULATORY_CARE_PROVIDER_SITE_OTHER): Payer: Medicare Other | Admitting: *Deleted

## 2014-02-07 ENCOUNTER — Encounter: Payer: Self-pay | Admitting: Podiatry

## 2014-02-07 VITALS — BP 133/71 | HR 79 | Resp 16

## 2014-02-07 DIAGNOSIS — M722 Plantar fascial fibromatosis: Secondary | ICD-10-CM

## 2014-02-07 NOTE — Patient Instructions (Signed)

## 2014-02-07 NOTE — Progress Notes (Signed)
Pt presents to pick up orthotics. Went over wearing instructions with pt. 

## 2014-02-27 ENCOUNTER — Ambulatory Visit (INDEPENDENT_AMBULATORY_CARE_PROVIDER_SITE_OTHER): Payer: Medicare Other

## 2014-02-27 DIAGNOSIS — Z23 Encounter for immunization: Secondary | ICD-10-CM

## 2014-03-07 ENCOUNTER — Ambulatory Visit: Payer: Medicare Other | Admitting: Podiatry

## 2014-03-07 ENCOUNTER — Ambulatory Visit (INDEPENDENT_AMBULATORY_CARE_PROVIDER_SITE_OTHER): Payer: Medicare Other | Admitting: Podiatry

## 2014-03-07 VITALS — BP 122/69 | HR 83 | Resp 16

## 2014-03-07 DIAGNOSIS — M2010 Hallux valgus (acquired), unspecified foot: Secondary | ICD-10-CM

## 2014-03-07 DIAGNOSIS — M7741 Metatarsalgia, right foot: Secondary | ICD-10-CM

## 2014-03-07 DIAGNOSIS — M722 Plantar fascial fibromatosis: Secondary | ICD-10-CM

## 2014-03-07 DIAGNOSIS — M2041 Other hammer toe(s) (acquired), right foot: Secondary | ICD-10-CM

## 2014-03-08 NOTE — Progress Notes (Signed)
Patient ID: Abigail Willis, female   DOB: 07/30/1945, 68 y.o.   MRN: [id number]  Subjective: Abigail Willis 68 year old female presents the office they for orthotic check. She states that she orthotics are doing well although she admits that she has not started wearing until recently. She does feel like the orthotics are "too bulky" in the toe area and the hammertoes are pressing up in the shoe more. For the plantar fasciitis she has been continuing with the stretching and icing intermittently. She does that she continues to have some heel pain although not consistently. No other complaints at this time. No acute changes since last appointment.  Objective: AAO x3, NAD DP/PT pulses palpable bilaterally, CRT less than 3 seconds Protective sensation intact the Semmes Weinstein monofilament, vibratory sensation intact, Achilles tendon reflex intact. Bilateral bunion deformities with elevated second digit and fusion of the PIPJ from prior surgery on the left. Contracture of the right second digit overlapping the hallux. Mild discomfort diffusely along the plantar metatarsal heads 1-5. Mild tenderness on palpation to the plantar medial tubercle of the calcaneus at the insertion the plantar fascia. There is no pain with lateral compression or vibratory sensation. No pain along the posterior aspect of the calcaneus. Open lesions or pre-ulcerative lesions.  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  Plan: -Conservative versus surgical treatment discussed including alternatives, risks, complications. -At this time and discussed with the patient that we can send the orthotics back to the company to have them modified. She states that she's leaning in a couple weeks ago for for the winter. She does not once in the orthotics back at this time. Discussed with her that we can do this a later date if she  desires. -Also discussed with her various treatments for plantar fasciitis. She states that she will continue with stretching and icing and while on any other treatments at this time. Continue with the orthotics. -Followup as needed. Call the office with any questions, concerns, change in symptoms.

## 2014-03-09 ENCOUNTER — Encounter: Payer: Self-pay | Admitting: *Deleted

## 2014-03-10 ENCOUNTER — Encounter: Payer: Self-pay | Admitting: Physician Assistant

## 2014-03-10 ENCOUNTER — Ambulatory Visit (INDEPENDENT_AMBULATORY_CARE_PROVIDER_SITE_OTHER): Payer: Medicare Other | Admitting: Physician Assistant

## 2014-03-10 VITALS — BP 120/70 | HR 80 | Ht 61.0 in | Wt 158.1 lb

## 2014-03-10 DIAGNOSIS — K589 Irritable bowel syndrome without diarrhea: Secondary | ICD-10-CM

## 2014-03-10 DIAGNOSIS — Z86010 Personal history of colonic polyps: Secondary | ICD-10-CM

## 2014-03-10 NOTE — Patient Instructions (Addendum)
You have been given a separate informational sheet regarding your tobacco use, the importance of quitting and local resources to help you quit.  Continue Probiotic daily. Use Levbid once daily as needed.  Follow up with Nicoletta Ba or Dr. Ardis Hughs as needed.

## 2014-03-10 NOTE — Progress Notes (Signed)
I agree with the above note, plan 

## 2014-03-10 NOTE — Progress Notes (Signed)
Subjective:    Patient ID: Abigail Willis, female    DOB: 1946-03-17, 68 y.o.   MRN: [id number]  HPI Abigail Willis is a pleasant 68 year old white female known to Dr. Ardis Hughs. She has history of IBS and adenomatous colon polyps. Last colonoscopy was done in June of 2012 she had 2 adenomatous polyps at that time and is planned for followup in 5 year antral. She was recently in to see Dr. Ardis Hughs with complaints of abdominal cramping. She has a long history of IBS and says she has periods of abdominal cramping and sometimes loose stools. She has been on a probiotic regularly and was prescribed Levbid twice daily. She comes in today for followup stating that the [place] helped with her cramps fairly quickly but seemed to cause very loose stools. She decreased to once daily and then stopped Levbid about 2 weeks ago and says she's been fine since. She's not currently having any problems with abdominal pain, cramping, or loose stools. She says certain foods seem to trigger her symptoms i.e. particularly nuts, she does not feel that she is lactose intolerant or gluten sensitive but has never been tested. She states she was not sure whether was okay to use the Levbid on a when necessary basis.    Review of Systems  Constitutional: Negative.   HENT: Negative.   Eyes: Negative.   Respiratory: Negative.   Cardiovascular: Negative.   Gastrointestinal: Positive for abdominal pain and diarrhea.  Endocrine: Negative.   Genitourinary: Negative.   Musculoskeletal: Negative.   Allergic/Immunologic: Negative.   Neurological: Negative.   Hematological: Negative.   Psychiatric/Behavioral: Negative.    Outpatient Prescriptions Prior to Visit  Medication Sig Dispense Refill  . aspirin 81 MG tablet Take 81 mg by mouth daily.        Marland Kitchen ibuprofen (ADVIL,MOTRIN) 200 MG tablet Take 200 mg by mouth as needed.        . pravastatin (PRAVACHOL) 10 MG tablet Take 1 tablet (10 mg total) by mouth every evening.  90 tablet  1  .  pseudoephedrine-guaifenesin (MUCINEX D) 60-600 MG per tablet Take 1 tablet by mouth every 12 (twelve) hours as needed.        . hyoscyamine (LEVBID) 0.375 MG 12 hr tablet Take 1 tablet (0.375 mg total) by mouth 2 (two) times daily.  60 tablet  11   No facility-administered medications prior to visit.   Allergies  Allergen Reactions  . Avelox [Moxifloxacin Hcl In Nacl] Other (See Comments)    Severe stomach cramping and nausea  . Penicillins     REACTION: rash   Patient Active Problem List   Diagnosis Date Noted  . Medicare annual wellness visit, subsequent 11/21/2013  . Abdominal pain, unspecified site 11/21/2013  . Hyperglycemia 11/21/2013  . Smoker 11/21/2013  . Varicose veins of lower extremities with other complications 51/76/1607  . Otalgia of right ear 02/14/2013  . Routine general medical examination at a health care facility 11/02/2012  . Benign paroxysmal positional vertigo 09/28/2012  . Family history of ovarian cancer 03/14/2011  . HYPERLIPIDEMIA 03/06/2010  . ALLERGIC RHINITIS 03/06/2010   History  Substance Use Topics  . Smoking status: Current Every Day Smoker -- 1.00 packs/day    Types: Cigarettes  . Smokeless tobacco: Never Used  . Alcohol Use: 0.6 oz/week    1 Glasses of wine per week     Comment: SOCIAL   family history includes Asthma in her father; Cancer in her mother. There is no history of Colon  cancer.     Objective:   Physical Exam well-developed older white female in no acute distress, pleasant blood pressure 120/70 pulse 80 height 5 foot 1 weight 158. HEENT; nontraumatic normocephalic EOMI PERRLA sclera anicteric, Supple; no JVD, Cardiovascular; regular rate and rhythm with S1-S2 no murmur or gallop, Pulmonary; clear bilaterally, Abdomen ;soft nontender nondistended bowel sounds are active there is no palpable mass or hepatosplenomegaly bowel sounds are present, Rectal; exam not done, Ext; no clubbing cyanosis or edema skin warm and dry, Psych; mood  and affect appropriate        Assessment & Plan:  #73  68 year old female with IBS generally manifested by abdominal cramping  #2 history of adenomatous colon polyps due for followup 2017  Plan; patient will continue a probiotic on a daily basis Avoid known food triggers i.e. Nuts She will use a Levbid on a when necessary basis and asked her to try just once daily when she does have episodes of cramping, and hopefully this will cause less problems with loose stools. She will follow up with Dr. Ardis Hughs in one year or sooner when necessary

## 2014-05-09 ENCOUNTER — Other Ambulatory Visit: Payer: Self-pay | Admitting: Family Medicine

## 2014-05-09 ENCOUNTER — Encounter: Payer: Self-pay | Admitting: Family Medicine

## 2014-05-09 MED ORDER — PRAVASTATIN SODIUM 10 MG PO TABS: 10.0000 mg | ORAL_TABLET | Freq: Every evening | ORAL | Status: DC

## 2014-08-01 ENCOUNTER — Other Ambulatory Visit: Payer: Self-pay

## 2014-08-01 DIAGNOSIS — Z1231 Encounter for screening mammogram for malignant neoplasm of breast: Secondary | ICD-10-CM

## 2014-09-21 ENCOUNTER — Ambulatory Visit (INDEPENDENT_AMBULATORY_CARE_PROVIDER_SITE_OTHER): Payer: Medicare Other | Admitting: Family Medicine

## 2014-09-21 ENCOUNTER — Encounter: Payer: Self-pay | Admitting: Family Medicine

## 2014-09-21 VITALS — BP 136/72 | HR 84 | Temp 98.0°F | Wt 158.8 lb

## 2014-09-21 DIAGNOSIS — Z720 Tobacco use: Secondary | ICD-10-CM

## 2014-09-21 DIAGNOSIS — J209 Acute bronchitis, unspecified: Secondary | ICD-10-CM | POA: Insufficient documentation

## 2014-09-21 DIAGNOSIS — F17200 Nicotine dependence, unspecified, uncomplicated: Secondary | ICD-10-CM

## 2014-09-21 MED ORDER — DOXYCYCLINE HYCLATE 100 MG PO CAPS: 100.0000 mg | ORAL_CAPSULE | Freq: Two times a day (BID) | ORAL | Status: DC

## 2014-09-21 MED ORDER — ALBUTEROL SULFATE HFA 108 (90 BASE) MCG/ACT IN AERS: 2.0000 | INHALATION_SPRAY | Freq: Four times a day (QID) | RESPIRATORY_TRACT | Status: DC | PRN

## 2014-09-21 NOTE — Progress Notes (Signed)
Subjective:   Patient ID: Abigail Willis, female    DOB: [birth year], 69 y.o.   MRN: [id number]  Abigail Willis is a pleasant 69 y.o. year old female who presents to clinic today with Cough and Nasal Congestion  on 09/21/2014  HPI:  Several days of progressive, cough, congestion. Just got back from a trip overseas.  She is now noticing upper back pain. No CP. No SOB.  Ears feel full.  Taking mucinex.  She is a smoker.   PMH significant for Avelox and PCN allergy.  Current Outpatient Prescriptions on File Prior to Visit  Medication Sig Dispense Refill  . acetaminophen (TYLENOL) 500 MG tablet Take 500 mg by mouth as needed.    Marland Kitchen aspirin 81 MG tablet Take 81 mg by mouth daily.      . hyoscyamine (LEVBID) 0.375 MG 12 hr tablet Take 0.375 mg by mouth as needed.    Marland Kitchen ibuprofen (ADVIL,MOTRIN) 200 MG tablet Take 200 mg by mouth as needed.      . loratadine (CLARITIN) 10 MG tablet Take 10 mg by mouth daily as needed for allergies.    . pravastatin (PRAVACHOL) 10 MG tablet Take 1 tablet (10 mg total) by mouth every evening. 90 tablet 3  . Probiotic Product (ALIGN) 4 MG CAPS Take 1 capsule by mouth daily.    . pseudoephedrine-guaifenesin (MUCINEX D) 60-600 MG per tablet Take 1 tablet by mouth every 12 (twelve) hours as needed.       No current facility-administered medications on file prior to visit.    Allergies  Allergen Reactions  . Avelox [Moxifloxacin Hcl In Nacl] Other (See Comments)    Severe stomach cramping and nausea  . Penicillins     REACTION: rash    Past Medical History  Diagnosis Date  . Hyperlipidemia   . Allergy     SINUS  . IBS (irritable bowel syndrome)   . External hemorrhoids   . Diverticulosis 11/15/2010    Moderate  . Colon polyps 11/14/2013    Tubular adenoma, hyperplastic polyps    Past Surgical History  Procedure Laterality Date  . Tubal ligation    . Tonsillectomy    . Colonoscopy    . Ganglion cyst excision    . Hammer toe surgery    .  Breast biopsy    . Polypectomy    . Leep       twice, in [place]    Family History  Problem Relation Age of Onset  . Cancer Mother   . Asthma Father   . Colon cancer Neg Hx     History   Social History  . Marital Status: Married    Spouse Name: N/A  . Number of Children: 1  . Years of Education: N/A   Occupational History  . Retired    Social History Main Topics  . Smoking status: Current Every Day Smoker -- 1.00 packs/day    Types: Cigarettes  . Smokeless tobacco: Never Used  . Alcohol Use: 0.6 oz/week    1 Glasses of wine per week     Comment: SOCIAL  . Drug Use: No  . Sexual Activity: Not on file   Other Topics Concern  . Not on file   Social History Narrative   Moved from [place] to be closer to her daughter, spends winters at [facility] home.      Has HPOA- husband Cletus Gash.   Would desire feeding tube, no prolonged life support.   Not sure  about feeding tubes.            The PMH, PSH, Social History, Family History, Medications, and allergies have been reviewed in [facility], and have been updated if relevant.   Review of Systems  Constitutional: Negative for fever.  HENT: Positive for congestion, postnasal drip, rhinorrhea and sore throat. Negative for sinus pressure and trouble swallowing.   Eyes: Negative.   Respiratory: Positive for cough. Negative for chest tightness, shortness of breath and stridor.   Cardiovascular: Negative.   Gastrointestinal: Negative.   Endocrine: Negative.   Musculoskeletal: Negative.   Neurological: Negative.   All other systems reviewed and are negative.      Objective:    BP 136/72 mmHg  Pulse 84  Temp(Src) 98 F (36.7 C) (Oral)  Wt 158 lb 12 oz (72.009 kg)  SpO2 92%   Physical Exam  Constitutional: She is oriented to person, place, and time. She appears well-developed and well-nourished. No distress.  HENT:  Head: Normocephalic.  Right Ear: Hearing and tympanic membrane normal.  Left Ear: Hearing normal.  Tympanic membrane is retracted.  Nose: Mucosal edema and rhinorrhea present. Right sinus exhibits no maxillary sinus tenderness and no frontal sinus tenderness. Left sinus exhibits no maxillary sinus tenderness and no frontal sinus tenderness.  Mouth/Throat: Uvula is midline. Posterior oropharyngeal erythema present. No oropharyngeal exudate.  Cardiovascular: Normal rate and regular rhythm.   Pulmonary/Chest: Effort normal. No respiratory distress. She has wheezes. She has no rales. She exhibits no tenderness.  Diffuse inspiratory and expiratory wheezes- scattered  Neurological: She is alert and oriented to person, place, and time. No cranial nerve deficit.  Skin: Skin is warm and dry.  Psychiatric: She has a normal mood and affect. Her behavior is normal. Judgment and thought content normal.  Nursing note and vitals reviewed.         Assessment & Plan:   Smoker No Follow-up on file.

## 2014-09-21 NOTE — Patient Instructions (Signed)
Great to see you. Please take doxycyline 100 mg twice daily x 10 days. Proair inhaler as needed for wheezing/cough/SOB.

## 2014-09-21 NOTE — Assessment & Plan Note (Signed)
New- in a smoker. Treat with doxycycline 100 mg twice daily x 10 days. proair as needed for cough, wheezes, SOB. Continue mucinex- discussed importance of pushing fluids. Call or return to clinic prn if these symptoms worsen or fail to improve as anticipated. The patient indicates understanding of these issues and agrees with the plan.

## 2014-09-21 NOTE — Progress Notes (Signed)
Pre visit review using our clinic review tool, if applicable. No additional management support is needed unless otherwise documented below in the visit note. 

## 2014-09-25 ENCOUNTER — Other Ambulatory Visit: Payer: Self-pay | Admitting: Family Medicine

## 2014-09-25 ENCOUNTER — Encounter: Payer: Self-pay | Admitting: Family Medicine

## 2014-11-13 ENCOUNTER — Other Ambulatory Visit: Payer: Self-pay | Admitting: Family Medicine

## 2014-11-13 DIAGNOSIS — Z0000 Encounter for general adult medical examination without abnormal findings: Secondary | ICD-10-CM

## 2014-11-13 DIAGNOSIS — R739 Hyperglycemia, unspecified: Secondary | ICD-10-CM

## 2014-11-21 ENCOUNTER — Other Ambulatory Visit (INDEPENDENT_AMBULATORY_CARE_PROVIDER_SITE_OTHER): Payer: Medicare Other

## 2014-11-21 DIAGNOSIS — Z0000 Encounter for general adult medical examination without abnormal findings: Secondary | ICD-10-CM

## 2014-11-21 DIAGNOSIS — R739 Hyperglycemia, unspecified: Secondary | ICD-10-CM

## 2014-11-21 LAB — COMPREHENSIVE METABOLIC PANEL
ALT: 17 U/L (ref 0–35)
AST: 22 U/L (ref 0–37)
Albumin: 3.9 g/dL (ref 3.5–5.2)
Alkaline Phosphatase: 73 U/L (ref 39–117)
BUN: 7 mg/dL (ref 6–23)
CALCIUM: 9.4 mg/dL (ref 8.4–10.5)
CO2: 31 mEq/L (ref 19–32)
Chloride: 103 mEq/L (ref 96–112)
Creatinine, Ser: 0.66 mg/dL (ref 0.40–1.20)
GFR: 94.24 mL/min (ref >60.00)
GLUCOSE: 91 mg/dL (ref 70–99)
Potassium: 4.6 mEq/L (ref 3.5–5.1)
Sodium: 140 mEq/L (ref 135–145)
Total Bilirubin: 0.4 mg/dL (ref 0.2–1.2)
Total Protein: 7 g/dL (ref 6.0–8.3)

## 2014-11-21 LAB — TSH: TSH: 2.64 u[IU]/mL (ref 0.35–4.50)

## 2014-11-21 LAB — CBC WITH DIFFERENTIAL/PLATELET
BASOS PCT: 0.5 % (ref 0.0–3.0)
Basophils Absolute: 0 10*3/uL (ref 0.0–0.1)
EOS PCT: 3.2 % (ref 0.0–5.0)
Eosinophils Absolute: 0.3 10*3/uL (ref 0.0–0.7)
HCT: 47.2 % — ABNORMAL HIGH (ref 36.0–46.0)
Hemoglobin: 15.7 g/dL — ABNORMAL HIGH (ref 12.0–15.0)
Lymphocytes Relative: 30.7 % (ref 12.0–46.0)
Lymphs Abs: 2.8 10*3/uL (ref 0.7–4.0)
MCHC: 33.3 g/dL (ref 30.0–36.0)
MCV: 91.3 fl (ref 78.0–100.0)
MONO ABS: 0.7 10*3/uL (ref 0.1–1.0)
Monocytes Relative: 7.5 % (ref 3.0–12.0)
Neutro Abs: 5.3 10*3/uL (ref 1.4–7.7)
Neutrophils Relative %: 58.1 % (ref 43.0–77.0)
Platelets: 304 10*3/uL (ref 150.0–400.0)
RBC: 5.18 Mil/uL — AB (ref 3.87–5.11)
RDW: 13.6 % (ref 11.5–15.5)
WBC: 9.2 10*3/uL (ref 4.0–10.5)

## 2014-11-21 LAB — LIPID PANEL
CHOL/HDL RATIO: 3
CHOLESTEROL: 190 mg/dL (ref 0–200)
HDL: 64.5 mg/dL (ref >39.00)
LDL CALC: 93 mg/dL (ref 0–99)
NonHDL: 125.5
TRIGLYCERIDES: 161 mg/dL — AB (ref 0.0–149.0)
VLDL: 32.2 mg/dL (ref 0.0–40.0)

## 2014-11-21 LAB — HEMOGLOBIN A1C: HEMOGLOBIN A1C: 5.5 % (ref 4.6–6.5)

## 2014-11-23 ENCOUNTER — Other Ambulatory Visit (HOSPITAL_COMMUNITY)
Admission: RE | Admit: 2014-11-23 | Discharge: 2014-11-23 | Disposition: A | Discharge status: Home / Self Care | Payer: Medicare Other | Source: Ambulatory Visit | Attending: Family Medicine | Admitting: Family Medicine

## 2014-11-23 ENCOUNTER — Encounter: Payer: Self-pay | Admitting: Family Medicine

## 2014-11-23 ENCOUNTER — Ambulatory Visit (INDEPENDENT_AMBULATORY_CARE_PROVIDER_SITE_OTHER): Payer: Medicare Other | Admitting: Family Medicine

## 2014-11-23 VITALS — BP 122/72 | HR 70 | Temp 98.3°F | Ht 61.0 in | Wt 156.5 lb

## 2014-11-23 DIAGNOSIS — J309 Allergic rhinitis, unspecified: Secondary | ICD-10-CM

## 2014-11-23 DIAGNOSIS — Z01419 Encounter for gynecological examination (general) (routine) without abnormal findings: Secondary | ICD-10-CM | POA: Diagnosis not present

## 2014-11-23 DIAGNOSIS — Z0000 Encounter for general adult medical examination without abnormal findings: Secondary | ICD-10-CM | POA: Diagnosis not present

## 2014-11-23 DIAGNOSIS — R87810 Cervical high risk human papillomavirus (HPV) DNA test positive: Secondary | ICD-10-CM | POA: Diagnosis present

## 2014-11-23 DIAGNOSIS — E785 Hyperlipidemia, unspecified: Secondary | ICD-10-CM

## 2014-11-23 DIAGNOSIS — Z124 Encounter for screening for malignant neoplasm of cervix: Secondary | ICD-10-CM | POA: Insufficient documentation

## 2014-11-23 DIAGNOSIS — F17200 Nicotine dependence, unspecified, uncomplicated: Secondary | ICD-10-CM

## 2014-11-23 DIAGNOSIS — E2839 Other primary ovarian failure: Secondary | ICD-10-CM

## 2014-11-23 DIAGNOSIS — Z1151 Encounter for screening for human papillomavirus (HPV): Secondary | ICD-10-CM | POA: Insufficient documentation

## 2014-11-23 NOTE — Assessment & Plan Note (Signed)
The patients weight, height, BMI and visual acuity have been recorded in the chart.  Cognitive function assessed.   I have made referrals, counseling and provided education to the patient based review of the above and I have provided the pt with a written personalized care plan for preventive services.  

## 2014-11-23 NOTE — Progress Notes (Signed)
69 yo pleasant female here for medicare annual wellness visit and follow up of chronic medical conditions.   I have personally reviewed the Medicare Annual Wellness questionnaire and have noted 1. The patient's medical and social history 2. Their use of alcohol, tobacco or illicit drugs 3. Their current medications and supplements 4. The patient's functional ability including ADL's, fall risks, home safety risks and hearing or visual             impairment. 5. Diet and physical activities 6. Evidence for depression or mood disorders  End of life wishes discussed and updated in Social History.  The roster of all physicians providing medical care to patient - is listed in the Care[employer]ams section of the chart.  Mammogram 12/28/13 Colonoscopy- 11/15/10- Dr. Ardis Hughs- 5 year recall Zostavax 08/22/09 Tdap 09/03/10 Pneumovax 09/03/10 Prevnar 13 11/21/13 Eye exam 10/2014- [place] [facility]  [facility]- taking Pravachol 10 mg qhs. Lab Results  Component Value Date   CHOL 190 11/21/2014   HDL 64.50 11/21/2014   LDLCALC 93 11/21/2014   LDLDIRECT 116.2 03/14/2011   TRIG 161.0* 11/21/2014   CHOLHDL 3 11/21/2014    Still smoking 1 ppd but thinking about cutting back.  Patient Active Problem List   Diagnosis Date Noted  . Medicare annual wellness visit, subsequent 11/23/2014  . Acute bronchitis 09/21/2014  . Hyperglycemia 11/21/2013  . Smoker 11/21/2013  . Varicose veins of lower extremities with other complications 41/74/0814  . Benign paroxysmal positional vertigo 09/28/2012  . HYPERLIPIDEMIA 03/06/2010  . ALLERGIC RHINITIS 03/06/2010   Past Medical History  Diagnosis Date  . Hyperlipidemia   . Allergy     SINUS  . IBS (irritable bowel syndrome)   . External hemorrhoids   . Diverticulosis 11/15/2010    Moderate  . Colon polyps 11/14/2013    Tubular adenoma, hyperplastic polyps   Past Surgical History  Procedure Laterality Date  . Tubal ligation    . Tonsillectomy    . Colonoscopy    .  Ganglion cyst excision    . Hammer toe surgery    . Breast biopsy    . Polypectomy    . Leep       twice, in [place]   History  Substance Use Topics  . Smoking status: Current Every Day Smoker -- 1.00 packs/day    Types: Cigarettes  . Smokeless tobacco: Never Used  . Alcohol Use: 0.6 oz/week    1 Glasses of wine per week     Comment: SOCIAL   Family History  Problem Relation Age of Onset  . Cancer Mother   . Asthma Father   . Colon cancer Neg Hx    Allergies  Allergen Reactions  . Avelox [Moxifloxacin Hcl In Nacl] Other (See Comments)    Severe stomach cramping and nausea  . Penicillins     REACTION: rash   Current Outpatient Prescriptions on File Prior to Visit  Medication Sig Dispense Refill  . acetaminophen (TYLENOL) 500 MG tablet Take 500 mg by mouth as needed.    Marland Kitchen albuterol (PROVENTIL HFA;VENTOLIN HFA) 108 (90 BASE) MCG/ACT inhaler Inhale 2 puffs into the lungs every 6 (six) hours as needed. 1 Inhaler 0  . aspirin 81 MG tablet Take 81 mg by mouth daily.      . hyoscyamine (LEVBID) 0.375 MG 12 hr tablet Take 0.375 mg by mouth as needed.    Marland Kitchen ibuprofen (ADVIL,MOTRIN) 200 MG tablet Take 200 mg by mouth as needed.      . loratadine (  CLARITIN) 10 MG tablet Take 10 mg by mouth daily as needed for allergies.    . pravastatin (PRAVACHOL) 10 MG tablet Take 1 tablet (10 mg total) by mouth every evening. 90 tablet 3  . Probiotic Product (ALIGN) 4 MG CAPS Take 1 capsule by mouth daily.    . pseudoephedrine-guaifenesin (MUCINEX D) 60-600 MG per tablet Take 1 tablet by mouth every 12 (twelve) hours as needed.       No current facility-administered medications on file prior to visit.   The PMH, PSH, Social History, Family History, Medications, and allergies have been reviewed in [facility], and have been updated if relevant.  ROS: Review of Systems  Constitutional: Negative.   HENT: Negative.   Respiratory: Negative.   Cardiovascular: Negative.   Gastrointestinal: Negative.    Endocrine: Negative.   Genitourinary: Negative.   Musculoskeletal: Negative.   Skin: Negative.   Allergic/Immunologic: Negative.   Neurological: Negative.   Hematological: Negative.   Psychiatric/Behavioral: Negative.   All other systems reviewed and are negative.   Physical exam: BP 122/72 mmHg  Pulse 70  Temp(Src) 98.3 F (36.8 C) (Oral)  Ht 5\' 1"  (1.549 m)  Wt 156 lb 8 oz (70.988 kg)  BMI 29.59 kg/m2  SpO2 95%   General:  Well-developed,well-nourished,in no acute distress; alert,appropriate and cooperative throughout examination Head:  normocephalic and atraumatic.   Eyes:  vision grossly intact, pupils equal, pupils round, and pupils reactive to light.   Ears:  R ear normal and L ear normal.   Nose:  no external deformity.   Mouth:  good dentition.   Neck:  No deformities, masses, or tenderness noted. Breasts:  No mass, nodules, thickening, tenderness, bulging, retraction, inflamation, nipple discharge or skin changes noted.   Lungs:  Normal respiratory effort, chest expands symmetrically. Lungs are clear to auscultation, no crackles or wheezes. Heart:  Normal rate and regular rhythm. S1 and S2 normal without gallop, murmur, click, rub or other extra sounds. Abdomen:  Bowel sounds positive,abdomen soft and non-tender without masses, organomegaly or hernias noted. Rectal:  no external abnormalities.   Genitalia:  Pelvic Exam:        External: normal female genitalia without lesions or masses        Vagina: normal without lesions or masses        Cervix: normal without lesions or masses        Adnexa: normal bimanual exam without masses or fullness        Uterus: normal by palpation  Pap today Msk:  No deformity or scoliosis noted of thoracic or lumbar spine.   Extremities:  No clubbing, cyanosis, edema, or deformity noted with normal full range of motion of all joints.   Neurologic:  alert & oriented X3 and gait normal.   Skin:  Intact without suspicious lesions or  rashes Cervical Nodes:  No lymphadenopathy noted Axillary Nodes:  No palpable lymphadenopathy Psych:  Cognition and judgment appear intact. Alert and cooperative with normal attention span and concentration. No apparent delusions, illusions, hallucinations

## 2014-11-23 NOTE — Addendum Note (Signed)
Addended by: Modena Nunnery on: 11/23/2014 09:09 AM   Modules accepted: Orders

## 2014-11-23 NOTE — Assessment & Plan Note (Signed)
Discussed USPSTF recommendations of cervical cancer screening.  She is aware that interval of 3 years is recommended but pt would prefer to have pap smear done today.  

## 2014-11-23 NOTE — Assessment & Plan Note (Signed)
Well controlled on current rx. No changes made. 

## 2014-11-23 NOTE — Progress Notes (Signed)
Pre visit review using our clinic review tool, if applicable. No additional management support is needed unless otherwise documented below in the visit note. 

## 2014-11-24 LAB — CYTOLOGY - PAP

## 2014-11-27 ENCOUNTER — Ambulatory Visit (INDEPENDENT_AMBULATORY_CARE_PROVIDER_SITE_OTHER): Payer: Medicare Other | Admitting: Family Medicine

## 2014-11-27 ENCOUNTER — Encounter: Payer: Self-pay | Admitting: Family Medicine

## 2014-11-27 ENCOUNTER — Other Ambulatory Visit: Payer: Self-pay | Admitting: Family Medicine

## 2014-11-27 VITALS — BP 108/62 | HR 84 | Temp 98.5°F | Wt 156.5 lb

## 2014-11-27 DIAGNOSIS — B977 Papillomavirus as the cause of diseases classified elsewhere: Secondary | ICD-10-CM | POA: Diagnosis not present

## 2014-11-27 DIAGNOSIS — M79661 Pain in right lower leg: Secondary | ICD-10-CM | POA: Diagnosis not present

## 2014-11-27 DIAGNOSIS — M67879 Other specified disorders of synovium and tendon, unspecified ankle and foot: Secondary | ICD-10-CM

## 2014-11-27 DIAGNOSIS — R888 Abnormal findings in other body fluids and substances: Secondary | ICD-10-CM | POA: Diagnosis not present

## 2014-11-27 DIAGNOSIS — IMO0002 Reserved for concepts with insufficient information to code with codable children: Secondary | ICD-10-CM | POA: Insufficient documentation

## 2014-11-27 DIAGNOSIS — M7660 Achilles tendinitis, unspecified leg: Secondary | ICD-10-CM | POA: Insufficient documentation

## 2014-11-27 NOTE — Assessment & Plan Note (Signed)
New- improving. Most likely related to her achilles tendonitis.  But since she is high risk for DVT, will get doppler to rule this out. The patient indicates understanding of these issues and agrees with the plan.

## 2014-11-27 NOTE — Progress Notes (Signed)
Pre visit review using our clinic review tool, if applicable. No additional management support is needed unless otherwise documented below in the visit note. 

## 2014-11-27 NOTE — Progress Notes (Signed)
Subjective:   Patient ID: Abigail Willis, female    DOB: February 13, 1946, 69 y.o.   MRN: [id number]  Suzanna Zahn is a pleasant 69 y.o. year old female who presents to clinic today with Ankle Pain  on 11/27/2014  HPI:  Right ankle/calf swelling- acute onset two days ago.  No known injury.  Foot was swollen, which has been improving since she started taking Ibuprofen.  Pain improving as well.  She is not sure if calf was red or warm.  No CP or SOB.  She is a smoker and a frequent traveler.  Takes ASA 81 mg daily.  Current Outpatient Prescriptions on File Prior to Visit  Medication Sig Dispense Refill  . acetaminophen (TYLENOL) 500 MG tablet Take 500 mg by mouth as needed.    Marland Willis albuterol (PROVENTIL HFA;VENTOLIN HFA) 108 (90 BASE) MCG/ACT inhaler Inhale 2 puffs into the lungs every 6 (six) hours as needed. 1 Inhaler 0  . aspirin 81 MG tablet Take 81 mg by mouth daily.      . hyoscyamine (LEVBID) 0.375 MG 12 hr tablet Take 0.375 mg by mouth as needed.    Marland Willis ibuprofen (ADVIL,MOTRIN) 200 MG tablet Take 200 mg by mouth as needed.      . loratadine (CLARITIN) 10 MG tablet Take 10 mg by mouth daily as needed for allergies.    . pravastatin (PRAVACHOL) 10 MG tablet Take 1 tablet (10 mg total) by mouth every evening. 90 tablet 3  . Probiotic Product (ALIGN) 4 MG CAPS Take 1 capsule by mouth daily.    . pseudoephedrine-guaifenesin (MUCINEX D) 60-600 MG per tablet Take 1 tablet by mouth every 12 (twelve) hours as needed.       No current facility-administered medications on file prior to visit.    Allergies  Allergen Reactions  . Avelox [Moxifloxacin Hcl In Nacl] Other (See Comments)    Severe stomach cramping and nausea  . Penicillins     REACTION: rash    Past Medical History  Diagnosis Date  . Hyperlipidemia   . Allergy     SINUS  . IBS (irritable bowel syndrome)   . External hemorrhoids   . Diverticulosis 11/15/2010    Moderate  . Colon polyps 11/14/2013    Tubular adenoma,  hyperplastic polyps    Past Surgical History  Procedure Laterality Date  . Tubal ligation    . Tonsillectomy    . Colonoscopy    . Ganglion cyst excision    . Hammer toe surgery    . Breast biopsy    . Polypectomy    . Leep       twice, in [place]    Family History  Problem Relation Age of Onset  . Cancer Mother   . Asthma Father   . Colon cancer Neg Hx     History   Social History  . Marital Status: Married    Spouse Name: N/A  . Number of Children: 1  . Years of Education: N/A   Occupational History  . Retired    Social History Main Topics  . Smoking status: Current Every Day Smoker -- 1.00 packs/day    Types: Cigarettes  . Smokeless tobacco: Never Used  . Alcohol Use: 0.6 oz/week    1 Glasses of wine per week     Comment: SOCIAL  . Drug Use: No  . Sexual Activity: Not on file   Other Topics Concern  . Not on file   Social History Narrative  Moved from [place] to be closer to her daughter, spends winters at [facility] home.      Has HPOA- husband Cletus Gash.   Would desire feeding tube, no prolonged life support.   Not sure about feeding tubes.            The PMH, PSH, Social History, Family History, Medications, and allergies have been reviewed in [facility], and have been updated if relevant.   Review of Systems  Constitutional: Negative.   Respiratory: Negative.   Cardiovascular: Positive for leg swelling.  Endocrine: Negative.   Neurological: Negative.   Hematological: Negative.   Psychiatric/Behavioral: Negative.   All other systems reviewed and are negative.      Objective:    BP 108/62 mmHg  Pulse 84  Temp(Src) 98.5 F (36.9 C) (Oral)  Wt 156 lb 8 oz (70.988 kg)   Physical Exam  Constitutional: She is oriented to person, place, and time. She appears well-developed and well-nourished. No distress.  HENT:  Head: Normocephalic and atraumatic.  Eyes: Conjunctivae are normal.  Cardiovascular: Normal rate.   Pulmonary/Chest: Effort  normal.  Musculoskeletal:       Right lower leg: She exhibits edema. She exhibits no tenderness.       Legs: Very trace edema right ankle  Neurological: She is oriented to person, place, and time. No cranial nerve deficit.  Skin: Skin is warm and dry.  Psychiatric: She has a normal mood and affect. Her behavior is normal. Judgment and thought content normal.  Nursing note and vitals reviewed.         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

## 2014-11-27 NOTE — Patient Instructions (Addendum)
Good to see you. Please stop by to see Rosaria Ferries on your way out. Take an aspirin 81 mg daily for next few days.

## 2014-11-27 NOTE — Assessment & Plan Note (Signed)
New- symptoms improving with NSAID.   Most likely a tendonitis.  No known trama or injury.

## 2014-12-01 ENCOUNTER — Ambulatory Visit (INDEPENDENT_AMBULATORY_CARE_PROVIDER_SITE_OTHER): Payer: Medicare Other

## 2014-12-01 DIAGNOSIS — M79661 Pain in right lower leg: Secondary | ICD-10-CM

## 2014-12-05 ENCOUNTER — Ambulatory Visit (INDEPENDENT_AMBULATORY_CARE_PROVIDER_SITE_OTHER): Payer: Medicare Other | Admitting: Obstetrics & Gynecology

## 2014-12-05 VITALS — BP 121/71 | HR 92 | Ht 61.0 in | Wt 157.0 lb

## 2014-12-05 DIAGNOSIS — R87810 Cervical high risk human papillomavirus (HPV) DNA test positive: Secondary | ICD-10-CM | POA: Diagnosis not present

## 2014-12-05 LAB — CYTOLOGY - PAP: CYTOLOGY - PAP: NORMAL

## 2014-12-05 LAB — PAP LB AND HPV HIGH-RISK
HPV DNA High Risk: NOT DETECTED
Pap: NEGATIVE

## 2014-12-05 NOTE — Progress Notes (Signed)
   Subjective:    Patient ID: Abigail Willis, female    DOB: 1945/10/25, 69 y.o.   MRN: [id number]  HPI  She is here with questions about what the plan of action will be with a pap 11/23/14 showing negative but +HR HPV (not 16or 18). Her pap smears since 2010 have all been negative with negative HR HPV. She had a LEEP in the distant past but cannot remember when.  She denies any new partners and doesn't think that her husband has had a new partner.   Review of Systems    She has a maternal FH of ovarian and brain cancer. Objective:   Physical Exam [id number] Breathing, ambulating, and conversing normally Abd- benign       Assessment & Plan:  FH of ovarian/brain- rec [facility] My Risk testing. She will check with her insurance company about coverage Pap smear rec is to recheck a pap in a year. If negative with negative HPV, then no more paps are necessary

## 2014-12-05 NOTE — Progress Notes (Signed)
Referred her by Velora Heckler for positive HPV test.  Patient denies any lesions.

## 2014-12-22 ENCOUNTER — Other Ambulatory Visit: Payer: Medicare Other

## 2015-01-01 ENCOUNTER — Ambulatory Visit
Admission: RE | Admit: 2015-01-01 | Discharge: 2015-01-01 | Disposition: A | Discharge status: Home / Self Care | Payer: Medicare Other | Source: Ambulatory Visit

## 2015-01-01 ENCOUNTER — Ambulatory Visit
Admission: RE | Admit: 2015-01-01 | Discharge: 2015-01-01 | Disposition: A | Discharge status: Home / Self Care | Payer: Medicare Other | Source: Ambulatory Visit | Attending: Family Medicine | Admitting: Family Medicine

## 2015-01-01 DIAGNOSIS — Z1231 Encounter for screening mammogram for malignant neoplasm of breast: Secondary | ICD-10-CM

## 2015-01-01 DIAGNOSIS — E2839 Other primary ovarian failure: Secondary | ICD-10-CM

## 2015-01-02 ENCOUNTER — Encounter: Payer: Self-pay | Admitting: *Deleted

## 2015-01-25 ENCOUNTER — Encounter: Payer: Self-pay | Admitting: Family Medicine

## 2015-01-25 ENCOUNTER — Ambulatory Visit (INDEPENDENT_AMBULATORY_CARE_PROVIDER_SITE_OTHER): Payer: Medicare Other | Admitting: Family Medicine

## 2015-01-25 VITALS — BP 124/72 | HR 71 | Temp 98.3°F | Wt 157.8 lb

## 2015-01-25 DIAGNOSIS — Z23 Encounter for immunization: Secondary | ICD-10-CM

## 2015-01-25 DIAGNOSIS — J0110 Acute frontal sinusitis, unspecified: Secondary | ICD-10-CM

## 2015-01-25 MED ORDER — SULFAMETHOXAZOLE-TRIMETHOPRIM 800-160 MG PO TABS: 1.0000 | ORAL_TABLET | Freq: Two times a day (BID) | ORAL | Status: DC

## 2015-01-25 NOTE — Progress Notes (Signed)
Pre visit review using our clinic review tool, if applicable. No additional management support is needed unless otherwise documented below in the visit note. 

## 2015-01-25 NOTE — Progress Notes (Signed)
SUBJECTIVE:  Abigail Willis is a 69 y.o. female who complains of coryza, congestion, sneezing, sore throat and bilateral sinus pain for 20 days. She denies a history of anorexia and chest pain and admits to a history of asthma. Patient admits to smoke cigarettes.   PMH significant for Avelox and PCN allergies.  Current Outpatient Prescriptions on File Prior to Visit  Medication Sig Dispense Refill  . acetaminophen (TYLENOL) 500 MG tablet Take 500 mg by mouth as needed.    Marland Kitchen albuterol (PROVENTIL HFA;VENTOLIN HFA) 108 (90 BASE) MCG/ACT inhaler Inhale 2 puffs into the lungs every 6 (six) hours as needed. 1 Inhaler 0  . aspirin 81 MG tablet Take 81 mg by mouth daily.      . hyoscyamine (LEVBID) 0.375 MG 12 hr tablet Take 0.375 mg by mouth as needed.    Marland Kitchen ibuprofen (ADVIL,MOTRIN) 200 MG tablet Take 200 mg by mouth as needed.      . loratadine (CLARITIN) 10 MG tablet Take 10 mg by mouth daily as needed for allergies.    . pravastatin (PRAVACHOL) 10 MG tablet Take 1 tablet (10 mg total) by mouth every evening. 90 tablet 3  . Probiotic Product (ALIGN) 4 MG CAPS Take 1 capsule by mouth daily.    . pseudoephedrine-guaifenesin (MUCINEX D) 60-600 MG per tablet Take 1 tablet by mouth every 12 (twelve) hours as needed.       No current facility-administered medications on file prior to visit.    Allergies  Allergen Reactions  . Avelox [Moxifloxacin Hcl In Nacl] Other (See Comments)    Severe stomach cramping and nausea  . Penicillins     REACTION: rash    Past Medical History  Diagnosis Date  . Hyperlipidemia   . Allergy     SINUS  . IBS (irritable bowel syndrome)   . External hemorrhoids   . Diverticulosis 11/15/2010    Moderate  . Colon polyps 11/14/2013    Tubular adenoma, hyperplastic polyps    Past Surgical History  Procedure Laterality Date  . Tubal ligation    . Tonsillectomy    . Colonoscopy    . Ganglion cyst excision    . Hammer toe surgery    . Breast biopsy    .  Polypectomy    . Leep       twice, in [place]    Family History  Problem Relation Age of Onset  . Cancer Mother   . Asthma Father   . Colon cancer Neg Hx     Social History   Social History  . Marital Status: Married    Spouse Name: N/A  . Number of Children: 1  . Years of Education: N/A   Occupational History  . Retired    Social History Main Topics  . Smoking status: Current Every Day Smoker -- 1.00 packs/day    Types: Cigarettes  . Smokeless tobacco: Never Used  . Alcohol Use: 0.6 oz/week    1 Glasses of wine per week     Comment: SOCIAL  . Drug Use: No  . Sexual Activity: Not on file   Other Topics Concern  . Not on file   Social History Narrative   Moved from [place] to be closer to her daughter, spends winters at [facility] home.      Has HPOA- husband Cletus Gash.   Would desire feeding tube, no prolonged life support.   Not sure about feeding tubes.  The PMH, PSH, Social History, Family History, Medications, and allergies have been reviewed in [facility], and have been updated if relevant.  OBJECTIVE:  BP 124/72 mmHg  Pulse 71  Temp(Src) 98.3 F (36.8 C) (Oral)  Wt 157 lb 12 oz (71.555 kg)  SpO2 97%  She appears well, vital signs are as noted. Ears normal.  Throat and pharynx normal.  Neck supple. No adenopathy in the neck. Nose is congested. Sinuses  tender. The chest is clear, without wheezes or rales.  ASSESSMENT:  sinusitis  PLAN: Given duration and progression of symptoms, will treat for bacterial sinusitis with Bactrim.  Symptomatic therapy suggested: push fluids, rest and return office visit prn if symptoms persist or worsen.Call or return to clinic prn if these symptoms worsen or fail to improve as anticipated.

## 2015-05-01 ENCOUNTER — Encounter: Payer: Self-pay | Admitting: Family Medicine

## 2015-05-02 MED ORDER — PRAVASTATIN SODIUM 10 MG PO TABS: 10.0000 mg | ORAL_TABLET | Freq: Every evening | ORAL | Status: DC

## 2015-10-02 ENCOUNTER — Other Ambulatory Visit: Payer: Self-pay

## 2015-10-02 DIAGNOSIS — Z1231 Encounter for screening mammogram for malignant neoplasm of breast: Secondary | ICD-10-CM

## 2015-11-02 ENCOUNTER — Encounter: Payer: Self-pay | Admitting: Family Medicine

## 2015-11-02 ENCOUNTER — Ambulatory Visit (INDEPENDENT_AMBULATORY_CARE_PROVIDER_SITE_OTHER): Payer: Medicare Other | Admitting: Family Medicine

## 2015-11-02 VITALS — BP 122/70 | HR 78 | Temp 98.5°F | Wt 157.5 lb

## 2015-11-02 DIAGNOSIS — J0100 Acute maxillary sinusitis, unspecified: Secondary | ICD-10-CM | POA: Diagnosis not present

## 2015-11-02 MED ORDER — DOXYCYCLINE HYCLATE 100 MG PO TABS: 100.0000 mg | ORAL_TABLET | Freq: Two times a day (BID) | ORAL | Status: DC

## 2015-11-02 NOTE — Patient Instructions (Signed)
Use nasal saline and flonase.  Start doxy.  Rest and fluids.  Sun exposure caution.  Take care.  Glad to see you.

## 2015-11-02 NOTE — Progress Notes (Signed)
Pre visit review using our clinic review tool, if applicable. No additional management support is needed unless otherwise documented below in the visit note.  Encouraged smoking cessation.   Sinus pressure for the last week.  Facial pain.  R>L ear pain. No fevers, no chills.  No vomiting.  Some cough.  Minimal sputum.  Some rhinorrhea.    Meds, vitals, and allergies reviewed.   ROS: Per HPI unless specifically indicated in ROS section   GEN: nad, alert and oriented HEENT: mucous membranes moist, tm w/o erythema, nasal exam w/o erythema, clear discharge noted,  OP with cobblestoning, R max sinus ttp NECK: supple w/o LA CV: rrr.   PULM: ctab, no inc wob EXT: no edema

## 2015-11-02 NOTE — Assessment & Plan Note (Signed)
Nontoxic, see avs.  Rest and fluids, doxy and saline and flonase.  She agrees.

## 2015-11-16 ENCOUNTER — Other Ambulatory Visit: Payer: Self-pay | Admitting: Family Medicine

## 2015-11-16 DIAGNOSIS — E785 Hyperlipidemia, unspecified: Secondary | ICD-10-CM

## 2015-11-16 DIAGNOSIS — Z0000 Encounter for general adult medical examination without abnormal findings: Secondary | ICD-10-CM

## 2015-11-23 ENCOUNTER — Other Ambulatory Visit (INDEPENDENT_AMBULATORY_CARE_PROVIDER_SITE_OTHER): Payer: Medicare Other

## 2015-11-23 DIAGNOSIS — E785 Hyperlipidemia, unspecified: Secondary | ICD-10-CM

## 2015-11-23 DIAGNOSIS — Z0000 Encounter for general adult medical examination without abnormal findings: Secondary | ICD-10-CM | POA: Diagnosis not present

## 2015-11-23 LAB — CBC WITH DIFFERENTIAL/PLATELET
BASOS ABS: 0 10*3/uL (ref 0.0–0.1)
BASOS PCT: 0.5 % (ref 0.0–3.0)
EOS ABS: 0.3 10*3/uL (ref 0.0–0.7)
Eosinophils Relative: 4.5 % (ref 0.0–5.0)
HEMATOCRIT: 46 % (ref 36.0–46.0)
Hemoglobin: 15.6 g/dL — ABNORMAL HIGH (ref 12.0–15.0)
LYMPHS ABS: 2.4 10*3/uL (ref 0.7–4.0)
Lymphocytes Relative: 30.5 % (ref 12.0–46.0)
MCHC: 33.8 g/dL (ref 30.0–36.0)
MCV: 89.3 fl (ref 78.0–100.0)
Monocytes Absolute: 0.7 10*3/uL (ref 0.1–1.0)
Monocytes Relative: 8.8 % (ref 3.0–12.0)
NEUTROS ABS: 4.3 10*3/uL (ref 1.4–7.7)
NEUTROS PCT: 55.7 % (ref 43.0–77.0)
PLATELETS: 274 10*3/uL (ref 150.0–400.0)
RBC: 5.16 Mil/uL — ABNORMAL HIGH (ref 3.87–5.11)
RDW: 13.3 % (ref 11.5–15.5)
WBC: 7.7 10*3/uL (ref 4.0–10.5)

## 2015-11-23 LAB — COMPREHENSIVE METABOLIC PANEL
ALK PHOS: 70 U/L (ref 39–117)
ALT: 15 U/L (ref 0–35)
AST: 20 U/L (ref 0–37)
Albumin: 4 g/dL (ref 3.5–5.2)
BILIRUBIN TOTAL: 0.4 mg/dL (ref 0.2–1.2)
BUN: 9 mg/dL (ref 6–23)
CALCIUM: 9.3 mg/dL (ref 8.4–10.5)
CHLORIDE: 105 meq/L (ref 96–112)
CO2: 31 meq/L (ref 19–32)
CREATININE: 0.67 mg/dL (ref 0.40–1.20)
GFR: 92.35 mL/min (ref >60.00)
GLUCOSE: 99 mg/dL (ref 70–99)
Potassium: 4.1 mEq/L (ref 3.5–5.1)
Sodium: 141 mEq/L (ref 135–145)
Total Protein: 6.7 g/dL (ref 6.0–8.3)

## 2015-11-23 LAB — LIPID PANEL
CHOLESTEROL: 200 mg/dL (ref 0–200)
HDL: 72.9 mg/dL (ref >39.00)
LDL CALC: 103 mg/dL — AB (ref 0–99)
NonHDL: 126.72
TRIGLYCERIDES: 120 mg/dL (ref 0.0–149.0)
Total CHOL/HDL Ratio: 3
VLDL: 24 mg/dL (ref 0.0–40.0)

## 2015-11-23 LAB — TSH: TSH: 1.89 u[IU]/mL (ref 0.35–4.50)

## 2015-11-27 ENCOUNTER — Ambulatory Visit (INDEPENDENT_AMBULATORY_CARE_PROVIDER_SITE_OTHER): Payer: Medicare Other | Admitting: Family Medicine

## 2015-11-27 ENCOUNTER — Encounter: Payer: Self-pay | Admitting: Family Medicine

## 2015-11-27 ENCOUNTER — Other Ambulatory Visit (HOSPITAL_COMMUNITY)
Admission: RE | Admit: 2015-11-27 | Discharge: 2015-11-27 | Disposition: A | Discharge status: Home / Self Care | Payer: Medicare Other | Source: Ambulatory Visit | Attending: Family Medicine | Admitting: Family Medicine

## 2015-11-27 VITALS — BP 132/72 | HR 67 | Temp 98.6°F | Ht 60.75 in | Wt 155.5 lb

## 2015-11-27 DIAGNOSIS — Z0000 Encounter for general adult medical examination without abnormal findings: Secondary | ICD-10-CM

## 2015-11-27 DIAGNOSIS — Z01419 Encounter for gynecological examination (general) (routine) without abnormal findings: Secondary | ICD-10-CM | POA: Diagnosis present

## 2015-11-27 DIAGNOSIS — B977 Papillomavirus as the cause of diseases classified elsewhere: Secondary | ICD-10-CM

## 2015-11-27 DIAGNOSIS — E785 Hyperlipidemia, unspecified: Secondary | ICD-10-CM

## 2015-11-27 DIAGNOSIS — K589 Irritable bowel syndrome without diarrhea: Secondary | ICD-10-CM | POA: Diagnosis not present

## 2015-11-27 DIAGNOSIS — R888 Abnormal findings in other body fluids and substances: Secondary | ICD-10-CM | POA: Diagnosis not present

## 2015-11-27 DIAGNOSIS — Z1151 Encounter for screening for human papillomavirus (HPV): Secondary | ICD-10-CM | POA: Insufficient documentation

## 2015-11-27 DIAGNOSIS — IMO0002 Reserved for concepts with insufficient information to code with codable children: Secondary | ICD-10-CM

## 2015-11-27 MED ORDER — PRAVASTATIN SODIUM 10 MG PO TABS: 10.0000 mg | ORAL_TABLET | Freq: Every evening | ORAL | Status: DC

## 2015-11-27 NOTE — Assessment & Plan Note (Signed)
The patients weight, height, BMI and visual acuity have been recorded in the chart.  Cognitive function assessed.   I have made referrals, counseling and provided education to the patient based review of the above and I have provided the pt with a written personalized care plan for preventive services.  

## 2015-11-27 NOTE — Patient Instructions (Signed)
Great to see you.  Let's try Culturelle instead of align.  Keep me updated.

## 2015-11-27 NOTE — Assessment & Plan Note (Signed)
Deteriorated. Advised trial of another probiotic, like culturelle. Call or return to clinic prn if these symptoms worsen or fail to improve as anticipated. The patient indicates understanding of these issues and agrees with the plan.

## 2015-11-27 NOTE — Progress Notes (Signed)
Pre visit review using our clinic review tool, if applicable. No additional management support is needed unless otherwise documented below in the visit note. 

## 2015-11-27 NOTE — Addendum Note (Signed)
Addended by: Modena Nunnery on: 11/27/2015 09:22 AM   Modules accepted: Orders, SmartSet

## 2015-11-27 NOTE — Progress Notes (Signed)
70 yo pleasant female here for medicare annual wellness visit and follow up of chronic medical conditions.   I have personally reviewed the Medicare Annual Wellness questionnaire and have noted 1. The patient's medical and social history 2. Their use of alcohol, tobacco or illicit drugs 3. Their current medications and supplements 4. The patient's functional ability including ADL's, fall risks, home safety risks and hearing or visual             impairment. 5. Diet and physical activities 6. Evidence for depression or mood disorders  End of life wishes discussed and updated in Social History.  The roster of all physicians providing medical care to patient - is listed in the CareTeams section of the chart.  Has been having more trouble with her IBS- bloating, constipation and diarrhea.  Align seems to be no longer effective.  Mammogram 01/15/16 Colonoscopy- 11/15/10- Dr. Ardis Hughs- 5 year recall Zostavax 08/22/09 Tdap 09/03/10 Pneumovax 09/03/10 Prevnar 13 11/21/13 Eye exam 10/2015- [facility] Pap smear 11/23/14- pos for HPV.  HLD- taking Pravachol 10 mg qhs. Lab Results  Component Value Date   CHOL 200 11/23/2015   HDL 72.90 11/23/2015   LDLCALC 103* 11/23/2015   LDLDIRECT 116.2 03/14/2011   TRIG 120.0 11/23/2015   CHOLHDL 3 11/23/2015     Patient Active Problem List   Diagnosis Date Noted  . 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 Medicare annual wellness visit, subsequent 11/23/2014  . Encounter for routine gynecological examination 11/23/2014  . Hyperglycemia 11/21/2013  . Varicose veins of lower extremities with other complications 99991111  . HLD (hyperlipidemia) 03/06/2010  . Allergic rhinitis 03/06/2010   Past Medical History  Diagnosis Date  . Hyperlipidemia   . Allergy     SINUS  . IBS (irritable bowel syndrome)   . External hemorrhoids   . Diverticulosis 11/15/2010    Moderate  . Colon polyps 11/14/2013    Tubular adenoma, hyperplastic  polyps   Past Surgical History  Procedure Laterality Date  . Tubal ligation    . Tonsillectomy    . Colonoscopy    . Ganglion cyst excision    . Hammer toe surgery    . Breast biopsy    . Polypectomy    . Leep       twice, in [place] History  Substance Use Topics  . Smoking status: Current Every Day Smoker -- 1.00 packs/day    Types: Cigarettes  . Smokeless tobacco: Never Used  . Alcohol Use: 0.6 oz/week    1 Glasses of wine per week     Comment: SOCIAL   Family History  Problem Relation Age of Onset  . Cancer Mother   . Asthma Father   . Colon cancer Neg Hx    Allergies  Allergen Reactions  . Avelox [Moxifloxacin Hcl In Nacl] Other (See Comments)    Severe stomach cramping and nausea  . Penicillins     REACTION: rash   Current Outpatient Prescriptions on File Prior to Visit  Medication Sig Dispense Refill  . acetaminophen (TYLENOL) 500 MG tablet Take 500 mg by mouth as needed.    Marland Kitchen aspirin 81 MG tablet Take 81 mg by mouth daily.      . hyoscyamine (LEVBID) 0.375 MG 12 hr tablet Take 0.375 mg by mouth as needed.    Marland Kitchen ibuprofen (ADVIL,MOTRIN) 200 MG tablet Take 200 mg by mouth as needed.      . loratadine (CLARITIN) 10 MG tablet Take  10 mg by mouth daily as needed for allergies.    . Probiotic Product (ALIGN) 4 MG CAPS Take 1 capsule by mouth daily.    . pseudoephedrine-guaifenesin (MUCINEX D) 60-600 MG per tablet Take 1 tablet by mouth every 12 (twelve) hours as needed.      . sodium chloride (OCEAN) 0.65 % SOLN nasal spray Place 1 spray into both nostrils as needed for congestion.     No current facility-administered medications on file prior to visit.   The PMH, PSH, Social History, Family History, Medications, and allergies have been reviewed in [facility], and have been updated if relevant.  ROS: Review of Systems  Constitutional: Negative.   HENT: Negative.   Respiratory: Negative.   Cardiovascular: Negative.   Gastrointestinal: Positive for diarrhea.   Endocrine: Negative.   Genitourinary: Negative.   Musculoskeletal: Negative.   Skin: Negative.   Allergic/Immunologic: Negative.   Neurological: Negative.   Hematological: Negative.   Psychiatric/Behavioral: Negative.   All other systems reviewed and are negative.   Physical exam: BP 132/72 mmHg  Pulse 67  Temp(Src) 98.6 F (37 C) (Oral)  Ht 5' 0.75" (1.543 m)  Wt 155 lb 8 oz (70.534 kg)  BMI 29.63 kg/m2  SpO2 95%   General:  Well-developed,well-nourished,in no acute distress; alert,appropriate and cooperative throughout examination Head:  normocephalic and atraumatic.   Eyes:  vision grossly intact, pupils equal, pupils round, and pupils reactive to light.   Ears:  R ear normal and L ear normal.   Nose:  no external deformity.   Mouth:  good dentition.   Neck:  No deformities, masses, or tenderness noted. Breasts:  No mass, nodules, thickening, tenderness, bulging, retraction, inflamation, nipple discharge or skin changes noted.   Lungs:  Normal respiratory effort, chest expands symmetrically. Lungs are clear to auscultation, no crackles or wheezes. Heart:  Normal rate and regular rhythm. S1 and S2 normal without gallop, murmur, click, rub or other extra sounds. Abdomen:  Bowel sounds positive,abdomen soft and non-tender without masses, organomegaly or hernias noted. Rectal:  no external abnormalities.   Genitalia:  Pelvic Exam:        External: normal female genitalia without lesions or masses        Vagina: normal without lesions or masses        Cervix: normal without lesions or masses        Adnexa: normal bimanual exam without masses or fullness        Uterus: normal by palpation  Pap today Msk:  No deformity or scoliosis noted of thoracic or lumbar spine.   Extremities:  No clubbing, cyanosis, edema, or deformity noted with normal full range of motion of all joints.   Neurologic:  alert & oriented X3 and gait normal.   Skin:  Intact without suspicious lesions or  rashes Cervical Nodes:  No lymphadenopathy noted Axillary Nodes:  No palpable lymphadenopathy Psych:  Cognition and judgment appear intact. Alert and cooperative with normal attention span and concentration. No apparent delusions, illusions, hallucinations

## 2015-11-27 NOTE — Assessment & Plan Note (Signed)
Pap repeated today.  

## 2015-11-27 NOTE — Addendum Note (Signed)
Addended by: Lucille Passy on: 11/27/2015 09:00 AM   Modules accepted: SmartSet

## 2015-11-29 ENCOUNTER — Encounter: Payer: Self-pay | Admitting: *Deleted

## 2015-11-29 LAB — CYTOLOGY - PAP

## 2015-12-11 ENCOUNTER — Ambulatory Visit (INDEPENDENT_AMBULATORY_CARE_PROVIDER_SITE_OTHER): Payer: Medicare Other | Admitting: Gastroenterology

## 2015-12-11 ENCOUNTER — Encounter: Payer: Self-pay | Admitting: Gastroenterology

## 2015-12-11 ENCOUNTER — Other Ambulatory Visit: Payer: Self-pay | Admitting: Gastroenterology

## 2015-12-11 VITALS — BP 110/70 | HR 68 | Ht 61.0 in | Wt 155.6 lb

## 2015-12-11 DIAGNOSIS — K589 Irritable bowel syndrome without diarrhea: Secondary | ICD-10-CM | POA: Diagnosis not present

## 2015-12-11 DIAGNOSIS — Z86010 Personal history of colonic polyps: Secondary | ICD-10-CM

## 2015-12-11 MED ORDER — NA SULFATE-K SULFATE-MG SULF 17.5-3.13-1.6 GM/177ML PO SOLN: 1.0000 | Freq: Once | ORAL | 0 refills | Status: AC

## 2015-12-11 NOTE — Progress Notes (Signed)
Review of pertinent gastrointestinal problems: 1. Adenomatous polyps:  Colonoscopy 10/2010 Dr. Ardis Hughs (for previous adenomatous polyps in [facility] including a 1cm pedunculated polyp in 1998) 4 subCM polyps removed, one was adenomatous, She was recommended to have repeat colonoscopy at 5 year interval.   2. IBS: with abd cramping; probiotics and PRN levbid has helped.    HPI: This is a  very pleasant 69 year old woman whom I last saw about 5 years ago the time of a surveillance colonoscopy. She was here in our office 2 years ago and saw Amy Ester would about typical IBS-like cramping discomforts.    Chief complaint is history of adenomas polyps, IBS, bleeding external hemorrhoid   Has intermittent abd cramping.  Constipated at times.    Levbid helps with cramping.  Can be very gasssy at times.   Takes imodium about once per month.  This helps a lot.  External hemorrhoid. Can have blood with just about every BM.    Burst hemorrhoid 2 weeks ago, this bled quite dramatically for 10 minutes or so and then stopped.  Never takes fiber supplements.   Past Medical History:  Diagnosis Date  . Allergy    SINUS  . Colon polyps 11/14/2013   Tubular adenoma, hyperplastic polyps  . Diverticulosis 11/15/2010   Moderate  . External hemorrhoids   . Hyperlipidemia   . IBS (irritable bowel syndrome)     Past Surgical History:  Procedure Laterality Date  . BREAST BIOPSY    . COLONOSCOPY    . GANGLION CYST EXCISION    . HAMMER TOE SURGERY    . LEEP      twice, in [place]  . POLYPECTOMY    . TONSILLECTOMY    . TUBAL LIGATION      Current Outpatient Prescriptions  Medication Sig Dispense Refill  . acetaminophen (TYLENOL) 500 MG tablet Take 500 mg by mouth as needed.    Marland Kitchen aspirin 81 MG tablet Take 81 mg by mouth daily.      . hyoscyamine (LEVBID) 0.375 MG 12 hr tablet Take 0.375 mg by mouth as needed.    Marland Kitchen ibuprofen (ADVIL,MOTRIN) 200 MG tablet Take 200 mg by mouth as needed.      .  loratadine (CLARITIN) 10 MG tablet Take 10 mg by mouth daily as needed for allergies.    . pravastatin (PRAVACHOL) 10 MG tablet Take 1 tablet (10 mg total) by mouth every evening. 90 tablet 2  . Probiotic Product (ALIGN) 4 MG CAPS Take 1 capsule by mouth daily.    . pseudoephedrine-guaifenesin (MUCINEX D) 60-600 MG per tablet Take 1 tablet by mouth every 12 (twelve) hours as needed.      . sodium chloride (OCEAN) 0.65 % SOLN nasal spray Place 1 spray into both nostrils as needed for congestion.     No current facility-administered medications for this visit.     Allergies as of 12/11/2015 - Review Complete 12/11/2015  Allergen Reaction Noted  . Avelox [moxifloxacin hcl in nacl] Other (See Comments) 03/16/2012  . Penicillins  03/06/2010    Family History  Problem Relation Age of Onset  . Cancer Mother   . Asthma Father   . Colon cancer Neg Hx     Social History   Social History  . Marital status: Married    Spouse name: N/A  . Number of children: 1  . Years of education: N/A   Occupational History  . Retired    Social History Main Topics  . Smoking status:  Current Every Day Smoker    Packs/day: 1.00    Types: Cigarettes  . Smokeless tobacco: Never Used  . Alcohol use 0.6 oz/week    1 Glasses of wine per week     Comment: SOCIAL  . Drug use: No  . Sexual activity: Not on file   Other Topics Concern  . Not on file   Social History Narrative   Moved from [place] to be closer to her daughter, spends winters at [facility] home.      Has HPOA- husband Cletus Gash.   Would desire feeding tube, no prolonged life support.   Not sure about feeding tubes.              Physical Exam: BP 110/70 (BP Location: Left Arm, Patient Position: Sitting, Cuff Size: Normal)   Pulse 68   Ht 5\' 1"  (1.549 m)   Wt 155 lb 9.6 oz (70.6 kg)   BMI 29.40 kg/m  Constitutional: generally well-appearing Psychiatric: alert and oriented x3 Abdomen: soft, nontender, nondistended, no  obvious ascites, no peritoneal signs, normal bowel sounds Rectal examination deferred for upcoming colonoscopy  Assessment and plan: 70 y.o. female with Personal history of adenomatous polyps, clinical history of external hemorrhoid, chronically loose stools, diarrhea predominant IBS  First she is due for surveillance colonoscopy around now and we will set that up for her. At the same time I'll do a good examination of her hemorrhoids, see if she has internal hemorrhoids as well as the fairly clear external hemorrhoids which she reports. If she does have internal component then she is probably a candidate for an office banding procedure of internal hemorrhoids which can also deflate external hemorrhoids. For her diarrhea predominant IBS I recommended she try taking a single Imodium on a daily basis rather than just when necessary. I see no reason for any further blood tests or imaging studies prior to this.   Owens Loffler, MD [facility] 12/11/2015, 2:18 PM

## 2015-12-11 NOTE — Patient Instructions (Addendum)
You have been given a separate informational sheet regarding your tobacco use, the importance of quitting and local resources to help you quit. You will be set up for a colonoscopy for polyp surveillance. Take one imodium every morning on a scheduled.

## 2016-01-08 ENCOUNTER — Encounter: Payer: Self-pay | Admitting: Gastroenterology

## 2016-01-15 ENCOUNTER — Ambulatory Visit: Payer: Medicare Other

## 2016-01-22 ENCOUNTER — Ambulatory Visit (AMBULATORY_SURGERY_CENTER): Payer: Medicare Other | Admitting: Gastroenterology

## 2016-01-22 ENCOUNTER — Encounter: Payer: Self-pay | Admitting: Gastroenterology

## 2016-01-22 VITALS — BP 105/59 | HR 68 | Temp 98.7°F | Resp 13 | Ht 61.0 in | Wt 155.0 lb

## 2016-01-22 DIAGNOSIS — D123 Benign neoplasm of transverse colon: Secondary | ICD-10-CM

## 2016-01-22 DIAGNOSIS — K649 Unspecified hemorrhoids: Secondary | ICD-10-CM | POA: Diagnosis not present

## 2016-01-22 DIAGNOSIS — K625 Hemorrhage of anus and rectum: Secondary | ICD-10-CM

## 2016-01-22 DIAGNOSIS — D122 Benign neoplasm of ascending colon: Secondary | ICD-10-CM

## 2016-01-22 DIAGNOSIS — K635 Polyp of colon: Secondary | ICD-10-CM

## 2016-01-22 DIAGNOSIS — D124 Benign neoplasm of descending colon: Secondary | ICD-10-CM

## 2016-01-22 DIAGNOSIS — Z86010 Personal history of colonic polyps: Secondary | ICD-10-CM

## 2016-01-22 DIAGNOSIS — D121 Benign neoplasm of appendix: Secondary | ICD-10-CM

## 2016-01-22 MED ORDER — SODIUM CHLORIDE 0.9 % IV SOLN: 500.0000 mL | INTRAVENOUS | Status: DC

## 2016-01-22 NOTE — Progress Notes (Signed)
Report to PACU, RN, vss, BBS= Clear.  

## 2016-01-22 NOTE — Patient Instructions (Signed)
Discharge instructions given. Handouts on polyps and hemorrhoids. Resume previous medications. YOU HAD AN ENDOSCOPIC PROCEDURE TODAY AT THE [facility]:   Refer to the procedure report that was given to you for any specific questions about what was found during the examination.  If the procedure report does not answer your questions, please call your gastroenterologist to clarify.  If you requested that your care partner not be given the details of your procedure findings, then the procedure report has been included in a sealed envelope for you to review at your convenience later.  YOU SHOULD EXPECT: Some feelings of bloating in the abdomen. Passage of more gas than usual.  Walking can help get rid of the air that was put into your GI tract during the procedure and reduce the bloating. If you had a lower endoscopy (such as a colonoscopy or flexible sigmoidoscopy) you may notice spotting of blood in your stool or on the toilet paper. If you underwent a bowel prep for your procedure, you may not have a normal bowel movement for a few days.  Please Note:  You might notice some irritation and congestion in your nose or some drainage.  This is from the oxygen used during your procedure.  There is no need for concern and it should clear up in a day or so.  SYMPTOMS TO REPORT IMMEDIATELY:   Following lower endoscopy (colonoscopy or flexible sigmoidoscopy):  Excessive amounts of blood in the stool  Significant tenderness or worsening of abdominal pains  Swelling of the abdomen that is new, acute  Fever of 100F or higher   For urgent or emergent issues, a gastroenterologist can be reached at any hour by calling [phone].   DIET:  We do recommend a small meal at first, but then you may proceed to your regular diet.  Drink plenty of fluids but you should avoid alcoholic beverages for 24 hours.  ACTIVITY:  You should plan to take it easy for the rest of today and you should NOT DRIVE  or use heavy machinery until tomorrow (because of the sedation medicines used during the test).    FOLLOW UP: Our staff will call the number listed on your records the next business day following your procedure to check on you and address any questions or concerns that you may have regarding the information given to you following your procedure. If we do not reach you, we will leave a message.  However, if you are feeling well and you are not experiencing any problems, there is no need to return our call.  We will assume that you have returned to your regular daily activities without incident.  If any biopsies were taken you will be contacted by phone or by letter within the next 1-3 weeks.  Please call us at [phone] if you have not heard about the biopsies in 3 weeks.    SIGNATURES/CONFIDENTIALITY: You and/or your care partner have signed paperwork which will be entered into your electronic medical record.  These signatures attest to the fact that that the information above on your After Visit Summary has been reviewed and is understood.  Full responsibility of the confidentiality of this discharge information lies with you and/or your care-partner. 

## 2016-01-22 NOTE — Op Note (Signed)
[facility] Patient Name: Abigail Willis [facility] Procedure Date: 01/22/2016 10:28 AM MRN: [id number] Endoscopist: Milus Banister , MD Age: 70 Referring MD:  Date of Birth: 05/02/1946 Gender: Female Account #: [id number] Procedure:                Colonoscopy Indications:              High risk colon cancer surveillance: Personal                            history of colonic polyps Adenomatous polyps:                            Colonoscopy 10/2010 Dr. Ardis Hughs (for previous                            adenomatous polyps in [facility] including a 1cm                            pedunculated polyp in 1998) 4 subCM polyps removed,                            one was adenomatous, She was recommended to have                            repeat colonoscopy at 5 year interval; mild                            intermittent rectal bleeding recently. Medicines:                Monitored Anesthesia Care Procedure:                Pre-Anesthesia Assessment:                           - Prior to the procedure, a History and Physical                            was performed, and patient medications and                            allergies were reviewed. The patient's tolerance of                            previous anesthesia was also reviewed. The risks                            and benefits of the procedure and the sedation                            options and risks were discussed with the patient.                            All questions were answered, and informed consent  was obtained. Prior Anticoagulants: The patient has                            taken no previous anticoagulant or antiplatelet                            agents. ASA Grade Assessment: II - A patient with                            mild systemic disease. After reviewing the risks                            and benefits, the patient was deemed in                            satisfactory condition to undergo  the procedure.                           After obtaining informed consent, the colonoscope                            was passed under direct vision. Throughout the                            procedure, the patient's blood pressure, pulse, and                            oxygen saturations were monitored continuously. The                            Model CF-HQ190L [id number]) scope was introduced                            through the anus and advanced to the the cecum,                            identified by appendiceal orifice and ileocecal                            valve. The colonoscopy was performed without                            difficulty. The patient tolerated the procedure                            well. The quality of the bowel preparation was                            good. The ileocecal valve, appendiceal orifice, and                            rectum were photographed. Scope In: 10:35:04 AM Scope Out: 10:48:07 AM Scope Withdrawal Time: 0 hours 10 minutes 29 seconds  Total Procedure Duration: 0  hours 13 minutes 3 seconds  Findings:                 Three sessile polyps were found in the descending                            colon, transverse colon and ascending colon. The                            polyps were 2 to 5 mm in size. These polyps were                            removed with a cold snare. Resection and retrieval                            were complete.                           External and internal hemorrhoids were found during                            retroflexion and during perianal exam. The                            hemorrhoids were small (internal) and medium                            (external). Non thrombosed.                           The exam was otherwise without abnormality on                            direct and retroflexion views. Complications:            No immediate complications. Estimated blood loss:                             None. Estimated Blood Loss:     Estimated blood loss: none. Impression:               - Three 2 to 5 mm polyps in the descending colon,                            in the transverse colon and in the ascending colon,                            removed with a cold snare. Resected and retrieved.                           - External and internal hemorrhoids.                           - The examination was otherwise normal on direct  and retroflexion views. Recommendation:           - Patient has a contact number available for                            emergencies. The signs and symptoms of potential                            delayed complications were discussed with the                            patient. Return to normal activities tomorrow.                            Written discharge instructions were provided to the                            patient.                           - Resume previous diet.                           - Continue present medications.                           - Dr. Ardis Hughs' office will arrange referral for                            in-office hemorrhoid banding.                           You will receive a letter within 2-3 weeks with the                            pathology results and my final recommendations.                           If the polyp(s) is proven to be 'pre-cancerous' on                            pathology, you will need repeat colonoscopy in 3-5                            years. Milus Banister, MD 01/22/2016 10:52:45 AM This report has been signed electronically.

## 2016-01-23 ENCOUNTER — Telehealth: Payer: Self-pay | Admitting: *Deleted

## 2016-01-23 NOTE — Telephone Encounter (Signed)
  Follow up Call-  Call back number 01/22/2016  Post procedure Call Back phone  # [phone]  Permission to leave phone message Yes  Some recent data might be hidden     Patient questions:  Do you have a fever, pain , or abdominal swelling? No. Pain Score  0 *  Have you tolerated food without any problems? Yes.    Have you been able to return to your normal activities? Yes.    Do you have any questions about your discharge instructions: Diet   No. Medications  No. Follow up visit  No.  Do you have questions or concerns about your Care? No.  Actions: * If pain score is 4 or above: No action needed, pain <4.

## 2016-01-29 ENCOUNTER — Other Ambulatory Visit: Payer: Self-pay | Admitting: Family Medicine

## 2016-01-29 ENCOUNTER — Ambulatory Visit
Admission: RE | Admit: 2016-01-29 | Discharge: 2016-01-29 | Disposition: A | Discharge status: Home / Self Care | Payer: Medicare Other | Source: Ambulatory Visit

## 2016-01-29 ENCOUNTER — Encounter: Payer: Self-pay | Admitting: Gastroenterology

## 2016-01-29 DIAGNOSIS — Z1231 Encounter for screening mammogram for malignant neoplasm of breast: Secondary | ICD-10-CM

## 2016-01-31 ENCOUNTER — Encounter: Payer: Self-pay | Admitting: Gastroenterology

## 2016-07-30 ENCOUNTER — Other Ambulatory Visit: Payer: Self-pay

## 2016-07-30 MED ORDER — PRAVASTATIN SODIUM 10 MG PO TABS: 10.0000 mg | ORAL_TABLET | Freq: Every evening | ORAL | 1 refills | Status: DC

## 2016-07-30 NOTE — Telephone Encounter (Signed)
Rx sent electronically.  

## 2016-08-01 ENCOUNTER — Telehealth: Payer: Self-pay | Admitting: Gastroenterology

## 2016-08-06 NOTE — Telephone Encounter (Signed)
The pt has been scheduled to see Dr Carlean Purl 09/16/16 to discuss banding.

## 2016-09-16 ENCOUNTER — Encounter: Payer: Self-pay | Admitting: Internal Medicine

## 2016-09-16 ENCOUNTER — Ambulatory Visit (INDEPENDENT_AMBULATORY_CARE_PROVIDER_SITE_OTHER): Payer: Medicare Other | Admitting: Internal Medicine

## 2016-09-16 VITALS — BP 126/58 | HR 88 | Ht 61.0 in | Wt 152.6 lb

## 2016-09-16 DIAGNOSIS — K648 Other hemorrhoids: Secondary | ICD-10-CM | POA: Diagnosis not present

## 2016-09-16 DIAGNOSIS — K580 Irritable bowel syndrome with diarrhea: Secondary | ICD-10-CM

## 2016-09-16 DIAGNOSIS — K644 Residual hemorrhoidal skin tags: Secondary | ICD-10-CM | POA: Diagnosis not present

## 2016-09-16 MED ORDER — ONDANSETRON HCL 4 MG PO TABS: 4.0000 mg | ORAL_TABLET | Freq: Three times a day (TID) | ORAL | 0 refills | Status: DC | PRN

## 2016-09-16 MED ORDER — METRONIDAZOLE 500 MG PO TABS: 500.0000 mg | ORAL_TABLET | Freq: Three times a day (TID) | ORAL | 0 refills | Status: AC

## 2016-09-16 NOTE — Progress Notes (Signed)
     HEMORRHOID BANDING   Sxs: bleeding, soreness, prolapse  Has IBS Sxs also w/ multiple stools 5-10 a aday mostly Am and variable loose/watery to formed and hard stress influences and is under some stress - hyoscyamine helps cramps, uses rare loperamide. Not lactose intolerant. Colonoscopy up to date. Is on daily probiotic. C/o Gas nd bloaing nd uses GasEx Some urgent defecation but "not too bad" and no GU sxs  [name], Gantt present.  Rectal: Small anal tags, no rash, NL resting tone ? Slightly decreased squeeze and seems to have NL relaxaion and descent w/ NL abd contraction on simulated defcation  Anoscopy: tender inflamed Gr 2 internal hemorrhoid LL and Gr 2 less inflamed RA/RP w/ small external components       PROCEDURE NOTE: The patient presents with symptomatic grade 2  hemorrhoids, requesting rubber band ligation of his/her hemorrhoidal disease.  All risks, benefits and alternative forms of therapy were described and informed consent was obtained.   The anorectum was pre-medicated with 0.125% NTG and 5% lidocaine The decision was made to band the RA internal hemorrhoid, and the [place] was used to perform band ligation without complication.  Digital anorectal examination was then performed to assure proper positioning of the band, and to adjust the banded tissue as required.  The patient was discharged home without pain or other issues.  Dietary and behavioral recommendations were given and along with follow-up instructions.     The following adjunctive treatments were recommended:  Treat IBS with metronidazole 500 mg tid x 10 d ? SIBO and prn ondansetron for diarrhea - want to control IBS sxs as 5-10 stools in a day may cause quick recurrence of symptomatic hemorrhoids  The patient will return in 2-3 weeks for  follow-up and possible additional banding as required. No complications were encountered and the patient tolerated the procedure well.   I  appreciate the opportunity to care for this patient.  Cc: Oretha Caprice, MD

## 2016-09-16 NOTE — Patient Instructions (Addendum)
We have sent the following medications to your pharmacy for you to pick up at your convenience: Generic zofran, generic Flagyl  HEMORRHOID [place]   1. The procedure you have had should have been relatively painless since the banding of the area involved does not have nerve endings and there is no pain sensation.  The rubber band cuts off the blood supply to the hemorrhoid and the band may fall off as soon as 48 hours after the banding (the band may occasionally be seen in the toilet bowl following a bowel movement). You may notice a temporary feeling of fullness in the rectum which should respond adequately to plain Tylenol or Motrin.  2. Following the banding, avoid strenuous exercise that evening and resume full activity the next day.  A sitz bath (soaking in a warm tub) or bidet is soothing, and can be useful for cleansing the area after bowel movements.     3. To avoid constipation, take two tablespoons of natural wheat bran, natural oat bran, flax, Benefiber or any over the counter fiber supplement and increase your water intake to 7-8 glasses daily.    4. Unless you have been prescribed anorectal medication, do not put anything inside your rectum for two weeks: No suppositories, enemas, fingers, etc.  5. Occasionally, you may have more bleeding than usual after the banding procedure.  This is often from the untreated hemorrhoids rather than the treated one.  Don't be concerned if there is a tablespoon or so of blood.  If there is more blood than this, lie flat with your bottom higher than your head and apply an ice pack to the area. If the bleeding does not stop within a half an hour or if you feel faint, call our office at [phone] or go to the emergency room.  6. Problems are not common; however, if there is a substantial amount of bleeding, severe pain, chills, fever or difficulty passing urine (very rare) or other problems, you should call us at  [phone] or report to the nearest emergency room.  7. Do not stay seated continuously for more than 2-3 hours for a day or two after the procedure.  Tighten your buttock muscles 10-15 times every two hours and take 10-15 deep breaths every 1-2 hours.  Do not spend more than a few minutes on the toilet if you cannot empty your bowel; instead re-visit the toilet at a later time.    Please follow up with Dr Carlean Purl for more banding on 10/02/16 at at 10:15AM.   I appreciate the opportunity to care for you. Ronney Lion, [facility]

## 2016-09-16 NOTE — Assessment & Plan Note (Signed)
Trial of metronidazole, ondansetron

## 2016-10-02 ENCOUNTER — Encounter: Payer: Self-pay | Admitting: Internal Medicine

## 2016-10-02 ENCOUNTER — Ambulatory Visit (INDEPENDENT_AMBULATORY_CARE_PROVIDER_SITE_OTHER): Payer: Medicare Other | Admitting: Internal Medicine

## 2016-10-02 VITALS — BP 120/70 | HR 80 | Ht 61.0 in | Wt 152.2 lb

## 2016-10-02 DIAGNOSIS — K644 Residual hemorrhoidal skin tags: Secondary | ICD-10-CM | POA: Diagnosis not present

## 2016-10-02 DIAGNOSIS — K648 Other hemorrhoids: Secondary | ICD-10-CM | POA: Diagnosis not present

## 2016-10-02 DIAGNOSIS — L309 Dermatitis, unspecified: Secondary | ICD-10-CM | POA: Diagnosis not present

## 2016-10-02 DIAGNOSIS — K580 Irritable bowel syndrome with diarrhea: Secondary | ICD-10-CM

## 2016-10-02 MED ORDER — HYOSCYAMINE SULFATE ER 0.375 MG PO TB12: 0.3750 mg | ORAL_TABLET | Freq: Two times a day (BID) | ORAL | 2 refills | Status: DC | PRN

## 2016-10-02 NOTE — Patient Instructions (Addendum)
  HEMORRHOID BANDING PROCEDURE    FOLLOW-UP CARE   1. The procedure you have had should have been relatively painless since the banding of the area involved does not have nerve endings and there is no pain sensation.  The rubber band cuts off the blood supply to the hemorrhoid and the band may fall off as soon as 48 hours after the banding (the band may occasionally be seen in the toilet bowl following a bowel movement). You may notice a temporary feeling of fullness in the rectum which should respond adequately to plain Tylenol or Motrin.  2. Following the banding, avoid strenuous exercise that evening and resume full activity the next day.  A sitz bath (soaking in a warm tub) or bidet is soothing, and can be useful for cleansing the area after bowel movements.     3. To avoid constipation, take two tablespoons of natural wheat bran, natural oat bran, flax, Benefiber or any over the counter fiber supplement and increase your water intake to 7-8 glasses daily.    4. Unless you have been prescribed anorectal medication, do not put anything inside your rectum for two weeks: No suppositories, enemas, fingers, etc.  5. Occasionally, you may have more bleeding than usual after the banding procedure.  This is often from the untreated hemorrhoids rather than the treated one.  Don't be concerned if there is a tablespoon or so of blood.  If there is more blood than this, lie flat with your bottom higher than your head and apply an ice pack to the area. If the bleeding does not stop within a half an hour or if you feel faint, call our office at [phone] or go to the emergency room.  6. Problems are not common; however, if there is a substantial amount of bleeding, severe pain, chills, fever or difficulty passing urine (very rare) or other problems, you should call us at [phone] or report to the nearest emergency room.  7. Do not stay seated continuously for more than 2-3 hours for a day or  two after the procedure.  Tighten your buttock muscles 10-15 times every two hours and take 10-15 deep breaths every 1-2 hours.  Do not spend more than a few minutes on the toilet if you cannot empty your bowel; instead re-visit the toilet at a later time.    You can try Recticare or the store brand for relief of rectal pain.  We have sent the following medications to your pharmacy for you to pick up at your convenience: Generic Levsin   Use Imodium on a regular basis.   Today we are giving you information on FODMAPS.   Purchase over the counter Desitin.   We are giving you samples of calmoseptine to try as well.   Follow up with Dr Carlean Purl as needed.    I appreciate the opportunity to care for you. Silvano Rusk, MD, [facility]

## 2016-10-02 NOTE — Progress Notes (Signed)
   Hemorrhoid Ligation  sxs: less rectal bleeding  IBS sxs persist - less gas after metronidazole - ondansetron no help Loperamide does work  [name], [place] present.  PROCEDURE NOTE: The patient presents with symptomatic grade 2 hemorrhoids, requesting rubber band ligation of his/her hemorrhoidal disease.  All risks, benefits and alternative forms of therapy were described and informed consent was obtained.   The anorectum was pre-medicated with 0.125% ntg AND 5% LIDOCAINE The decision was made to band the RP and LL  internal hemorrhoids, and the [place] was used to perform band ligation without complication.  Digital anorectal examination was then performed to assure proper positioning of the band, and to adjust the banded tissue as required.  The patient was discharged home without pain or other issues.  Dietary and behavioral recommendations were given and along with follow-up instructions.     The following adjunctive treatments were recommended:  Scheduled loperamide Calmoseptine or other barrier FODMAPS  The patient will return prn for  follow-up and possible additional banding as required. No complications were encountered and the patient tolerated the procedure well.  I appreciate the opportunity to care for this patient. CW:CBJS, Marciano Sequin, MD

## 2016-10-03 ENCOUNTER — Encounter: Payer: Self-pay | Admitting: Internal Medicine

## 2016-10-03 DIAGNOSIS — L309 Dermatitis, unspecified: Secondary | ICD-10-CM | POA: Insufficient documentation

## 2016-10-03 DIAGNOSIS — K648 Other hemorrhoids: Principal | ICD-10-CM

## 2016-10-03 DIAGNOSIS — K644 Residual hemorrhoidal skin tags: Secondary | ICD-10-CM | POA: Insufficient documentation

## 2016-10-20 ENCOUNTER — Other Ambulatory Visit: Payer: Self-pay | Admitting: Family Medicine

## 2016-10-20 DIAGNOSIS — Z1231 Encounter for screening mammogram for malignant neoplasm of breast: Secondary | ICD-10-CM

## 2016-11-18 ENCOUNTER — Encounter: Payer: Self-pay | Admitting: Family

## 2016-11-18 ENCOUNTER — Other Ambulatory Visit
Admission: RE | Admit: 2016-11-18 | Discharge: 2016-11-18 | Disposition: A | Discharge status: Home / Self Care | Payer: Medicare Other | Source: Ambulatory Visit | Attending: Family | Admitting: Family

## 2016-11-18 ENCOUNTER — Ambulatory Visit: Payer: Medicare Other | Admitting: Family Medicine

## 2016-11-18 ENCOUNTER — Ambulatory Visit (INDEPENDENT_AMBULATORY_CARE_PROVIDER_SITE_OTHER): Payer: Medicare Other | Admitting: Family

## 2016-11-18 ENCOUNTER — Telehealth: Payer: Self-pay | Admitting: Family Medicine

## 2016-11-18 ENCOUNTER — Ambulatory Visit
Admission: RE | Admit: 2016-11-18 | Discharge: 2016-11-18 | Disposition: A | Discharge status: Home / Self Care | Payer: Medicare Other | Source: Ambulatory Visit | Attending: Family | Admitting: Family

## 2016-11-18 VITALS — BP 140/66 | HR 76 | Temp 98.4°F | Ht 61.0 in | Wt 150.4 lb

## 2016-11-18 DIAGNOSIS — I700 Atherosclerosis of aorta: Secondary | ICD-10-CM | POA: Diagnosis not present

## 2016-11-18 DIAGNOSIS — R1030 Lower abdominal pain, unspecified: Secondary | ICD-10-CM

## 2016-11-18 DIAGNOSIS — E278 Other specified disorders of adrenal gland: Secondary | ICD-10-CM | POA: Diagnosis not present

## 2016-11-18 DIAGNOSIS — Z1231 Encounter for screening mammogram for malignant neoplasm of breast: Secondary | ICD-10-CM

## 2016-11-18 DIAGNOSIS — K5792 Diverticulitis of intestine, part unspecified, without perforation or abscess without bleeding: Secondary | ICD-10-CM | POA: Diagnosis not present

## 2016-11-18 HISTORY — DX: Malignant (primary) neoplasm, unspecified: C80.1

## 2016-11-18 LAB — BASIC METABOLIC PANEL
Anion gap: 7 (ref 5–15)
BUN: 10 mg/dL (ref 6–20)
CALCIUM: 8.9 mg/dL (ref 8.9–10.3)
CHLORIDE: 101 mmol/L (ref 101–111)
CO2: 28 mmol/L (ref 22–32)
CREATININE: 0.62 mg/dL (ref 0.44–1.00)
GFR calc non Af Amer: >60 mL/min (ref >60)
Glucose, Bld: 77 mg/dL (ref 65–99)
Potassium: 4 mmol/L (ref 3.5–5.1)
SODIUM: 136 mmol/L (ref 135–145)

## 2016-11-18 LAB — URINALYSIS, ROUTINE W REFLEX MICROSCOPIC
BILIRUBIN URINE: NEGATIVE
Glucose, UA: NEGATIVE mg/dL
HGB URINE DIPSTICK: NEGATIVE
KETONES UR: NEGATIVE mg/dL
Leukocytes, UA: NEGATIVE
NITRITE: NEGATIVE
Protein, ur: NEGATIVE mg/dL
SPECIFIC GRAVITY, URINE: 1.016 (ref 1.005–1.030)
pH: 6 (ref 5.0–8.0)

## 2016-11-18 LAB — POCT I-STAT CREATININE: CREATININE: 0.6 mg/dL (ref 0.44–1.00)

## 2016-11-18 MED ORDER — IOPAMIDOL (ISOVUE-300) INJECTION 61%
100.0000 mL | Freq: Once | INTRAVENOUS | Status: AC | PRN
  Administered 2016-11-18: 100 mL via INTRAVENOUS

## 2016-11-18 NOTE — Addendum Note (Signed)
Addended by: Leeanne Rio on: 11/18/2016 02:53 PM   Modules accepted: Orders

## 2016-11-18 NOTE — Progress Notes (Signed)
Pre visit review using our clinic review tool, if applicable. No additional management support is needed unless otherwise documented below in the visit note. 

## 2016-11-18 NOTE — Patient Instructions (Signed)
CT scan  Will call with results -please keep phone with you.

## 2016-11-18 NOTE — Telephone Encounter (Signed)
Noted. Thanks for touching base with the patient.

## 2016-11-18 NOTE — Telephone Encounter (Signed)
Having abdominal pain started x 1 week ago , pain comes off and  on is not constant. feels like a knife in her vaginal area going up through her abdomen. HX of irritable bowel, denies nausea , vomiting or diarrhea.Patient has GYN and GI physician but did not know whom she should see first, that why she made appointment with PCP. Patient had ordinally made appointment with PCP , which canceled by PCP this morning due to PCP out of office unexpectedly.Patient denies any other symptoms.

## 2016-11-18 NOTE — Progress Notes (Signed)
Subjective:    Patient ID: Abigail Willis, female    DOB: 03/08/1946, 71 y.o.   MRN: [id number]  CC: Abigail Willis is a 71 y.o. female who presents today for an acute visit.    HPI: CC: abdominal pain X one week, off and on. One week ago, had diarrhea for 2 days and took imodium. Non bloody diarrhea, which resolved.   Feels stabbing pain pain in vaginal area. Hasn't migrated. More severe than regular 'IBS cramps'. Levsin helps some. Endorses being gasy, bloated.  No N, V, dysuria, urinary frequency.   Had recently been on 2 week cruise.   IBS- Abigail Willis; last OV 11/2015; probiotics and PRN levbid has helped. ; advised single imodium daily PRN  colonoscopy 09/2016; hemorrhoid banding with Abigail Willis   h/o diverticulitis         HISTORY:  Past Medical History:  Diagnosis Date  . Allergy    SINUS  . Colon polyps 11/14/2013   Tubular adenoma, hyperplastic polyps  . Diverticulosis 11/15/2010   Moderate  . External hemorrhoids   . Hyperlipidemia   . IBS (irritable bowel syndrome)    Past Surgical History:  Procedure Laterality Date  . BREAST BIOPSY    . COLONOSCOPY    . GANGLION CYST EXCISION    . HAMMER TOE SURGERY    . LEEP      twice, in [place]  . POLYPECTOMY    . TONSILLECTOMY    . TUBAL LIGATION     Family History  Problem Relation Age of Onset  . Cancer Mother   . Asthma Father   . Brain cancer Sister   . Colon cancer Cousin     Allergies: Avelox [moxifloxacin hcl in nacl] and Penicillins Current Outpatient Prescriptions on File Prior to Visit  Medication Sig Dispense Refill  . acetaminophen (TYLENOL) 500 MG tablet Take 500 mg by mouth as needed.    Marland Kitchen aspirin 81 MG tablet Take 81 mg by mouth daily.      . hyoscyamine (LEVBID) 0.375 MG 12 hr tablet Take 1 tablet (0.375 mg total) by mouth every 12 (twelve) hours as needed. 60 tablet 2  . ibuprofen (ADVIL,MOTRIN) 200 MG tablet Take 200 mg by mouth as needed.      . loratadine (CLARITIN) 10 MG  tablet Take 10 mg by mouth daily as needed for allergies.    . pravastatin (PRAVACHOL) 10 MG tablet Take 1 tablet (10 mg total) by mouth every evening. 90 tablet 1  . Probiotic Product (ALIGN) 4 MG CAPS Take 1 capsule by mouth daily.    . pseudoephedrine-guaifenesin (MUCINEX D) 60-600 MG per tablet Take 1 tablet by mouth every 12 (twelve) hours as needed.      . sodium chloride (OCEAN) 0.65 % SOLN nasal spray Place 1 spray into both nostrils as needed for congestion.     No current facility-administered medications on file prior to visit.     Social History  Substance Use Topics  . Smoking status: Current Every Day Smoker    Packs/day: 1.00    Types: Cigarettes  . Smokeless tobacco: Never Used  . Alcohol use 0.6 oz/week    1 Glasses of wine per week     Comment: SOCIAL    Review of Systems  Constitutional: Negative for chills and fever.  Respiratory: Negative for cough.   Cardiovascular: Negative for chest pain and palpitations.  Gastrointestinal: Positive for abdominal pain and diarrhea. Negative for abdominal distention, blood in  stool, constipation, nausea and vomiting.  Genitourinary: Negative for dysuria.      Objective:    BP 140/66   Pulse 76   Temp 98.4 F (36.9 C) (Oral)   Ht 5\' 1"  (1.549 m)   Wt 150 lb 6.4 oz (68.2 kg)   SpO2 95%   BMI 28.42 kg/m    Physical Exam  Constitutional: She appears well-developed and well-nourished.  Eyes: Conjunctivae are normal.  Cardiovascular: Normal rate, regular rhythm, normal heart sounds and normal pulses.   Pulmonary/Chest: Effort normal and breath sounds normal. She has no wheezes. She has no rhonchi. She has no rales.  Abdominal: Soft. Normal appearance and bowel sounds are normal. She exhibits no distension, no fluid wave, no ascites and no mass. There is no tenderness. There is no rigidity, no rebound, no guarding and no CVA tenderness.  Benign abdominal exam.   Neurological: She is alert.  Skin: Skin is warm and dry.    Psychiatric: She has a normal mood and affect. Her speech is normal and behavior is normal. Thought content normal.  Vitals reviewed.      Assessment & Plan:  1. Lower abdominal pain Patient is well-appearing and not having abdominal pain at this time. Reassured by benign abdominal exam. We discussed her past medical history- irritable bowel syndrome and also diverticulitis, and jointly decided we wanted to go ahead and pursue CT abdomen, pelvis due to severity of pain when it occurs. Pending urinalysis, BMP as well.  - CT ABDOMEN PELVIS W CONTRAST - Basic metabolic panel - POCT urinalysis dipstick - Urinalysis, Routine w reflex microscopic     I am having Abigail Willis maintain her aspirin, ibuprofen, pseudoephedrine-guaifenesin, acetaminophen, loratadine, ALIGN, sodium chloride, pravastatin, and hyoscyamine.   No orders of the defined types were placed in this encounter.   Return precautions given.   Risks, benefits, and alternatives of the medications and treatment plan prescribed today were discussed, and patient expressed understanding.   Education regarding symptom management and diagnosis given to patient on AVS.  Continue to follow with Abigail Passy, MD for routine health maintenance.   Abigail Willis and I agreed with plan.   Abigail Paris, Abigail Willis

## 2016-11-20 ENCOUNTER — Telehealth: Payer: Self-pay

## 2016-11-20 NOTE — Progress Notes (Signed)
Subjective:   Abigail Willis is a 71 y.o. female who presents for Medicare Annual (Subsequent) preventive examination.  Review of Systems:  No ROS.  Medicare Wellness Visit. Additional risk factors are reflected in the social history.  Cardiac Risk Factors include: advanced age (>49men, >23 women);dyslipidemia;hypertension;smoking/ tobacco exposure     Objective:     Vitals: BP 112/72   Pulse 87   Ht 5\' 1"  (1.549 m)   Wt 152 lb 8 oz (69.2 kg)   SpO2 98%   BMI 28.81 kg/m   Body mass index is 28.81 kg/m.   Tobacco History  Smoking Status  . Current Every Day Smoker  . Packs/day: 1.00  . Types: Cigarettes  Smokeless Tobacco  . Never Used     Ready to quit: No Counseling given: Yes   Past Medical History:  Diagnosis Date  . Allergy    SINUS  . Cancer ([place])   . Colon polyps 11/14/2013   Tubular adenoma, hyperplastic polyps  . Diverticulosis 11/15/2010   Moderate  . External hemorrhoids   . Hyperlipidemia   . IBS (irritable bowel syndrome)    Past Surgical History:  Procedure Laterality Date  . BREAST BIOPSY    . COLONOSCOPY    . GANGLION CYST EXCISION    . HAMMER TOE SURGERY    . LEEP      twice, in [place]  . POLYPECTOMY    . TONSILLECTOMY    . TUBAL LIGATION     Family History  Problem Relation Age of Onset  . Cancer Mother   . Asthma Father   . Brain cancer Sister   . Colon cancer Cousin    History  Sexual Activity  . Sexual activity: Yes    Outpatient Encounter Prescriptions as of 11/27/2016  Medication Sig  . ciprofloxacin (CIPRO) 750 MG tablet Take 1 tablet by mouth every 12 (twelve) hours.  . metroNIDAZOLE (FLAGYL) 500 MG tablet Take 1 tablet by mouth every 6 (six) hours.  Marland Kitchen acetaminophen (TYLENOL) 500 MG tablet Take 500 mg by mouth as needed.  Marland Kitchen aspirin 81 MG tablet Take 81 mg by mouth daily.    . hyoscyamine (LEVBID) 0.375 MG 12 hr tablet Take 1 tablet (0.375 mg total) by mouth every 12 (twelve) hours as needed.  Marland Kitchen ibuprofen  (ADVIL,MOTRIN) 200 MG tablet Take 200 mg by mouth as needed.    . loratadine (CLARITIN) 10 MG tablet Take 10 mg by mouth daily as needed for allergies.  . pravastatin (PRAVACHOL) 10 MG tablet Take 1 tablet (10 mg total) by mouth every evening.  . Probiotic Product (ALIGN) 4 MG CAPS Take 1 capsule by mouth daily.  . pseudoephedrine-guaifenesin (MUCINEX D) 60-600 MG per tablet Take 1 tablet by mouth every 12 (twelve) hours as needed.    . sodium chloride (OCEAN) 0.65 % SOLN nasal spray Place 1 spray into both nostrils as needed for congestion.   No facility-administered encounter medications on file as of 11/27/2016.     Activities of Daily Living In your present state of health, do you have any difficulty performing the following activities: 11/27/2016  Hearing? N  Vision? N  Difficulty concentrating or making decisions? N  Walking or climbing stairs? N  Dressing or bathing? N  Doing errands, shopping? N  Preparing Food and eating ? N  Using the Toilet? N  In the past six months, have you accidently leaked urine? N  Do you have problems with loss of bowel control? N  Managing your Medications? N  Managing your Finances? N  Housekeeping or managing your Housekeeping? N  Some recent data might be hidden    Patient Care Team: Lucille Passy, MD as PCP - General Milus Banister, MD (Gastroenterology) Leandrew Koyanagi, MD (Ophthalmology) Angelique Blonder, DO (Dermatology)    Assessment:    Physical assessment deferred to PCP.  Exercise Activities and Dietary recommendations Current Exercise Habits: Home exercise routine, Type of exercise: Other - see comments (water aerobics), Time (Minutes): 30, Frequency (Times/Week): 2, Weekly Exercise (Minutes/Week): 60, Intensity: Moderate  Goals    . Increase physical activity          Starting 11/27/2016, I will start walking at least 2 days weekly.      Fall Risk Fall Risk  11/27/2016 11/27/2015 11/23/2014 11/23/2014 11/21/2013  Falls in the  past year? No No No No No  Number falls in past yr: - - - - -  Injury with Fall? - - - - -   Depression Screen PHQ 2/9 Scores 11/27/2016 11/27/2015 11/23/2014 11/23/2014  PHQ - 2 Score 0 0 0 0     Cognitive Function MMSE - Mini Mental State Exam 11/27/2016  Orientation to time 5  Orientation to Place 5  Registration 3  Attention/ Calculation 0  Recall 3  Language- name 2 objects 0  Language- repeat 1  Language- follow 3 step command 3  Language- read & follow direction 0  Write a sentence 0  Copy design 0  Total score 20        Immunization History  Administered Date(s) Administered  . Influenza Split 03/14/2011, 02/20/2012  . Influenza Whole 03/21/2010  . Influenza,inj,Quad PF,36+ Mos 02/14/2013, 02/27/2014, 01/25/2015  . Pneumococcal Conjugate-13 11/21/2013  . Pneumococcal Polysaccharide-23 09/03/2010  . Td 09/03/2010  . Zoster 08/22/2009   Screening Tests Health Maintenance  Topic Date Due  . Hepatitis C Screening  11/27/2017 (Originally [birth year])  . INFLUENZA VACCINE  12/17/2016  . MAMMOGRAM  01/28/2017  . TETANUS/TDAP  09/02/2020  . COLONOSCOPY  01/21/2021  . DEXA SCAN  Completed  . PNA vac Low Risk Adult  Completed      Plan:    Follow-up w/ PCP as scheduled.   I have personally reviewed and noted the following in the patient's chart:   . Medical and social history . Use of alcohol, tobacco or illicit drugs  . Current medications and supplements . Functional ability and status . Nutritional status . Physical activity . Advanced directives . List of other physicians . Vitals . Screenings to include cognitive, depression, and falls . Referrals and appointments  In addition, I have reviewed and discussed with patient certain preventive protocols, quality metrics, and best practice recommendations. A written personalized care plan for preventive services as well as general preventive health recommendations were provided to patient.     Dorrene German, RN  11/27/2016

## 2016-11-20 NOTE — Telephone Encounter (Signed)
Patient was informed of results per [facility] staff message.  Patient understood and no questions, comments, or concerns at this time. Patient also received both VM from [facility].Marland Kitchen

## 2016-11-20 NOTE — Progress Notes (Signed)
PCP notes:   Health maintenance: Hep C screening due. Patient had labs drawn prior to [facility] and lab is unable to add Hep C. She will plan to do this with next labs.   Abnormal screenings: Hearing-failed R ear at 1000 Hz.    Patient concerns: Recently dx w/ diverticulitis and treated w/ atbx. Patient would like to go over CT results with PCP.   Nurse concerns: None.   Next PCP appt: 12/01/2016 @ 9:15am

## 2016-11-21 ENCOUNTER — Other Ambulatory Visit: Payer: Self-pay | Admitting: Family Medicine

## 2016-11-21 DIAGNOSIS — Z0000 Encounter for general adult medical examination without abnormal findings: Secondary | ICD-10-CM

## 2016-11-24 ENCOUNTER — Telehealth: Payer: Self-pay | Admitting: Family

## 2016-11-24 NOTE — Telephone Encounter (Signed)
Again left message- this time at home to let her know I left message on cell

## 2016-11-24 NOTE — Telephone Encounter (Signed)
Call pt cell phone and left detailed message regarding ct.

## 2016-11-25 ENCOUNTER — Other Ambulatory Visit: Payer: Medicare Other

## 2016-11-25 ENCOUNTER — Ambulatory Visit: Payer: Medicare Other

## 2016-11-27 ENCOUNTER — Other Ambulatory Visit (INDEPENDENT_AMBULATORY_CARE_PROVIDER_SITE_OTHER): Payer: Medicare Other

## 2016-11-27 ENCOUNTER — Encounter: Payer: Medicare Other | Admitting: Family Medicine

## 2016-11-27 ENCOUNTER — Ambulatory Visit (INDEPENDENT_AMBULATORY_CARE_PROVIDER_SITE_OTHER): Payer: Medicare Other

## 2016-11-27 VITALS — BP 112/72 | HR 87 | Ht 61.0 in | Wt 152.5 lb

## 2016-11-27 DIAGNOSIS — Z0000 Encounter for general adult medical examination without abnormal findings: Secondary | ICD-10-CM

## 2016-11-27 LAB — COMPREHENSIVE METABOLIC PANEL
ALBUMIN: 3.9 g/dL (ref 3.5–5.2)
ALK PHOS: 61 U/L (ref 39–117)
ALT: 35 U/L (ref 0–35)
AST: 31 U/L (ref 0–37)
BUN: 7 mg/dL (ref 6–23)
CHLORIDE: 104 meq/L (ref 96–112)
CO2: 29 mEq/L (ref 19–32)
Calcium: 9.4 mg/dL (ref 8.4–10.5)
Creatinine, Ser: 0.68 mg/dL (ref 0.40–1.20)
GFR: 90.52 mL/min (ref >60.00)
Glucose, Bld: 96 mg/dL (ref 70–99)
POTASSIUM: 4.3 meq/L (ref 3.5–5.1)
SODIUM: 140 meq/L (ref 135–145)
TOTAL PROTEIN: 6.6 g/dL (ref 6.0–8.3)
Total Bilirubin: 0.4 mg/dL (ref 0.2–1.2)

## 2016-11-27 LAB — LIPID PANEL
CHOLESTEROL: 142 mg/dL (ref 0–200)
HDL: 72.7 mg/dL (ref >39.00)
LDL CALC: 52 mg/dL (ref 0–99)
NonHDL: 68.94
TRIGLYCERIDES: 84 mg/dL (ref 0.0–149.0)
Total CHOL/HDL Ratio: 2
VLDL: 16.8 mg/dL (ref 0.0–40.0)

## 2016-11-27 LAB — CBC WITH DIFFERENTIAL/PLATELET
BASOS PCT: 0.6 % (ref 0.0–3.0)
Basophils Absolute: 0.1 10*3/uL (ref 0.0–0.1)
EOS PCT: 3 % (ref 0.0–5.0)
Eosinophils Absolute: 0.3 10*3/uL (ref 0.0–0.7)
HCT: 44.2 % (ref 36.0–46.0)
Hemoglobin: 15.3 g/dL — ABNORMAL HIGH (ref 12.0–15.0)
Lymphocytes Relative: 30.5 % (ref 12.0–46.0)
Lymphs Abs: 2.9 10*3/uL (ref 0.7–4.0)
MCHC: 34.6 g/dL (ref 30.0–36.0)
MCV: 89.8 fl (ref 78.0–100.0)
MONO ABS: 0.7 10*3/uL (ref 0.1–1.0)
MONOS PCT: 7.4 % (ref 3.0–12.0)
Neutro Abs: 5.5 10*3/uL (ref 1.4–7.7)
Neutrophils Relative %: 58.5 % (ref 43.0–77.0)
Platelets: 282 10*3/uL (ref 150.0–400.0)
RBC: 4.93 Mil/uL (ref 3.87–5.11)
RDW: 13.4 % (ref 11.5–15.5)
WBC: 9.4 10*3/uL (ref 4.0–10.5)

## 2016-11-27 LAB — TSH: TSH: 2.73 u[IU]/mL (ref 0.35–4.50)

## 2016-11-27 NOTE — Progress Notes (Signed)
I reviewed health advisor's note, was available for consultation, and agree with documentation and plan.  

## 2016-11-27 NOTE — Patient Instructions (Addendum)
[name] , Thank you for taking time to come for your Medicare Wellness Visit. I appreciate your ongoing commitment to your health goals. Please review the following plan we discussed and let me know if I can assist you in the future.   These are the goals we discussed: Goals    . Increase physical activity          Starting 11/27/2016, I will start walking at least 2 days weekly.       This is a list of the screening recommended for you and due dates:  Health Maintenance  Topic Date Due  .  Hepatitis C: One time screening is recommended by Center for Disease Control  (CDC) for  adults born from [birth year] through 1966.   23-Nov-1945  . Flu Shot  12/17/2016  . Mammogram  01/28/2017  . Tetanus Vaccine  09/02/2020  . Colon Cancer Screening  01/21/2021  . DEXA scan (bone density measurement)  Completed  . Pneumonia vaccines  Completed   Preventive Care for Adults  A healthy lifestyle and preventive care can promote health and wellness. Preventive health guidelines for adults include the following key practices.  . A routine yearly physical is a good way to check with your health care provider about your health and preventive screening. It is a chance to share any concerns and updates on your health and to receive a thorough exam.  . Visit your dentist for a routine exam and preventive care every 6 months. Brush your teeth twice a day and floss once a day. Good oral hygiene prevents tooth decay and gum disease.  . The frequency of eye exams is based on your age, health, family medical history, use  of contact lenses, and other factors. Follow your health care provider's ecommendations for frequency of eye exams.  . Eat a healthy diet. Foods like vegetables, fruits, whole grains, low-fat dairy products, and lean protein foods contain the nutrients you need without too many calories. Decrease your intake of foods high in solid fats, added sugars, and salt. Eat the right amount of  calories for you. Get information about a proper diet from your health care provider, if necessary.  . Regular physical exercise is one of the most important things you can do for your health. Most adults should get at least 150 minutes of moderate-intensity exercise (any activity that increases your heart rate and causes you to sweat) each week. In addition, most adults need muscle-strengthening exercises on 2 or more days a week.  Silver Sneakers may be a benefit available to you. To determine eligibility, you may visit the website: www.silversneakers.com or contact program at (864)403-6033 Mon-Fri between 8AM-8PM.   . Maintain a healthy weight. The body mass index (BMI) is a screening tool to identify possible weight problems. It provides an estimate of body fat based on height and weight. Your health care provider can find your BMI and can help you achieve or maintain a healthy weight.   For adults 20 years and older: ? A BMI below 18.5 is considered underweight. ? A BMI of 18.5 to 24.9 is normal. ? A BMI of 25 to 29.9 is considered overweight. ? A BMI of 30 and above is considered obese.   . Maintain normal blood lipids and cholesterol levels by exercising and minimizing your intake of saturated fat. Eat a balanced diet with plenty of fruit and vegetables. Blood tests for lipids and cholesterol should begin at age 73 and be repeated every  5 years. If your lipid or cholesterol levels are high, you are over 50, or you are at high risk for heart disease, you may need your cholesterol levels checked more frequently. Ongoing high lipid and cholesterol levels should be treated with medicines if diet and exercise are not working.  . If you smoke, find out from your health care provider how to quit. If you do not use tobacco, please do not start.  . If you choose to drink alcohol, please do not consume more than 2 drinks per day. One drink is considered to be 12 ounces (355 mL) of beer, 5 ounces  (148 mL) of wine, or 1.5 ounces (44 mL) of liquor.  . If you are 30-29 years old, ask your health care provider if you should take aspirin to prevent strokes.  . Use sunscreen. Apply sunscreen liberally and repeatedly throughout the day. You should seek shade when your shadow is shorter than you. Protect yourself by wearing long sleeves, pants, a wide-brimmed hat, and sunglasses year round, whenever you are outdoors.  . Once a month, do a whole body skin exam, using a mirror to look at the skin on your back. Tell your health care provider of new moles, moles that have irregular borders, moles that are larger than a pencil eraser, or moles that have changed in shape or color.   Steps to Quit Smoking Smoking tobacco can be bad for your health. It can also affect almost every organ in your body. Smoking puts you and people around you at risk for many serious long-lasting (chronic) diseases. Quitting smoking is hard, but it is one of the best things that you can do for your health. It is never too late to quit. What are the benefits of quitting smoking? When you quit smoking, you lower your risk for getting serious diseases and conditions. They can include:  Lung cancer or lung disease.  Heart disease.  Stroke.  Heart attack.  Not being able to have children (infertility).  Weak bones (osteoporosis) and broken bones (fractures).  If you have coughing, wheezing, and shortness of breath, those symptoms may get better when you quit. You may also get sick less often. If you are pregnant, quitting smoking can help to lower your chances of having a baby of low birth weight. What can I do to help me quit smoking? Talk with your doctor about what can help you quit smoking. Some things you can do (strategies) include:  Quitting smoking totally, instead of slowly cutting back how much you smoke over a period of time.  Going to in-person counseling. You are more likely to quit if you go to many  counseling sessions.  Using resources and support systems, such as: ? Database administrator with a Social worker. ? Phone quitlines. ? Careers information officer. ? Support groups or group counseling. ? Text messaging programs. ? Mobile phone apps or applications.  Taking medicines. Some of these medicines may have nicotine in them. If you are pregnant or breastfeeding, do not take any medicines to quit smoking unless your doctor says it is okay. Talk with your doctor about counseling or other things that can help you.  Talk with your doctor about using more than one strategy at the same time, such as taking medicines while you are also going to in-person counseling. This can help make quitting easier. What things can I do to make it easier to quit? Quitting smoking might feel very hard at first, but there is a  lot that you can do to make it easier. Take these steps:  Talk to your family and friends. Ask them to support and encourage you.  Call phone quitlines, reach out to support groups, or work with a Social worker.  Ask people who smoke to not smoke around you.  Avoid places that make you want (trigger) to smoke, such as: ? Bars. ? Parties. ? Smoke-break areas at work.  Spend time with people who do not smoke.  Lower the stress in your life. Stress can make you want to smoke. Try these things to help your stress: ? Getting regular exercise. ? Deep-breathing exercises. ? Yoga. ? Meditating. ? Doing a body scan. To do this, close your eyes, focus on one area of your body at a time from head to toe, and notice which parts of your body are tense. Try to relax the muscles in those areas.  Download or buy apps on your mobile phone or tablet that can help you stick to your quit plan. There are many free apps, such as QuitGuide from the [employer] Office manager for Disease Control and Prevention). You can find more support from smokefree.gov and other websites.  This information is not intended to replace  advice given to you by your health care provider. Make sure you discuss any questions you have with your health care provider. Document Released: 03/01/2009 Document Revised: 01/01/2016 Document Reviewed: 09/19/2014 Elsevier Interactive Patient Education  2018 [employer].

## 2016-12-01 ENCOUNTER — Ambulatory Visit (INDEPENDENT_AMBULATORY_CARE_PROVIDER_SITE_OTHER): Payer: Medicare Other | Admitting: Family Medicine

## 2016-12-01 VITALS — BP 124/70 | HR 96 | Ht 61.0 in | Wt 152.0 lb

## 2016-12-01 DIAGNOSIS — K5792 Diverticulitis of intestine, part unspecified, without perforation or abscess without bleeding: Secondary | ICD-10-CM

## 2016-12-01 DIAGNOSIS — E785 Hyperlipidemia, unspecified: Secondary | ICD-10-CM | POA: Diagnosis not present

## 2016-12-01 DIAGNOSIS — Z780 Asymptomatic menopausal state: Secondary | ICD-10-CM | POA: Diagnosis not present

## 2016-12-01 DIAGNOSIS — Z0000 Encounter for general adult medical examination without abnormal findings: Secondary | ICD-10-CM | POA: Diagnosis not present

## 2016-12-01 DIAGNOSIS — Z01419 Encounter for gynecological examination (general) (routine) without abnormal findings: Secondary | ICD-10-CM

## 2016-12-01 MED ORDER — PRAVASTATIN SODIUM 10 MG PO TABS: 10.0000 mg | ORAL_TABLET | Freq: Every evening | ORAL | 3 refills | Status: DC

## 2016-12-01 NOTE — Assessment & Plan Note (Signed)
Reviewed preventive care protocols, scheduled due services, and updated immunizations Discussed nutrition, exercise, diet, and healthy lifestyle.  

## 2016-12-01 NOTE — Assessment & Plan Note (Signed)
S/p abx. Feeling well.

## 2016-12-01 NOTE — Progress Notes (Signed)
71 yo pleasant female here for CPXt and follow up of chronic medical conditions.   Annual medicare wellness visit with Candis Musa, RN on 11/27/16.  Note reviewed.    Mammogram 01/29/16 Colonoscopy- 01/22/16- Dr. Ardis Hughs- 5 year recall Zostavax 08/22/09 Tdap 09/03/10 Pneumovax 09/03/10 Prevnar 13 11/21/13  Pap smear 11/27/15  HLD- taking Pravachol 10 mg qhs. Lab Results  Component Value Date   CHOL 142 11/27/2016   HDL 72.70 11/27/2016   LDLCALC 52 11/27/2016   LDLDIRECT 116.2 03/14/2011   TRIG 84.0 11/27/2016   CHOLHDL 2 11/27/2016    Diverticulitis- seen by Mable Paris on 11/18/16 for abdominal pain. Notes reviewed. CT showed mild diverticulitis.Finished course of cipro and flagyl and pain and diarrhea have resolved.  Patient Active Problem List   Diagnosis Date Noted  . Well woman exam 12/01/2016  . Diverticulitis 12/01/2016  . Internal and external bleeding hemorrhoids 10/03/2016  . Perianal dermatitis 10/03/2016  . IBS (irritable bowel syndrome) 11/27/2015  . 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 Encounter for routine gynecological examination 11/23/2014  . Hyperglycemia 11/21/2013  . Varicose veins of lower extremities with other complications 35/32/9924  . HLD (hyperlipidemia) 03/06/2010  . Allergic rhinitis 03/06/2010   Past Medical History:  Diagnosis Date  . Allergy    SINUS  . Cancer ([place])   . Colon polyps 11/14/2013   Tubular adenoma, hyperplastic polyps  . Diverticulosis 11/15/2010   Moderate  . External hemorrhoids   . Hyperlipidemia   . IBS (irritable bowel syndrome)    Past Surgical History:  Procedure Laterality Date  . BREAST BIOPSY    . COLONOSCOPY    . GANGLION CYST EXCISION    . HAMMER TOE SURGERY    . LEEP      twice, in [place]  . POLYPECTOMY    . TONSILLECTOMY    . TUBAL LIGATION     Social History  Substance Use Topics  . Smoking status: Current Every Day Smoker    Packs/day: 1.00    Types: Cigarettes  .  Smokeless tobacco: Never Used  . Alcohol use 0.6 oz/week    1 Glasses of wine per week     Comment: SOCIAL   Family History  Problem Relation Age of Onset  . Cancer Mother   . Asthma Father   . Brain cancer Sister   . Colon cancer Cousin    Allergies  Allergen Reactions  . Avelox [Moxifloxacin Hcl In Nacl] Other (See Comments)    Severe stomach cramping and nausea  . Penicillins Rash   Current Outpatient Prescriptions on File Prior to Visit  Medication Sig Dispense Refill  . acetaminophen (TYLENOL) 500 MG tablet Take 500 mg by mouth as needed.    Marland Kitchen aspirin 81 MG tablet Take 81 mg by mouth daily.      . hyoscyamine (LEVBID) 0.375 MG 12 hr tablet Take 1 tablet (0.375 mg total) by mouth every 12 (twelve) hours as needed. 60 tablet 2  . ibuprofen (ADVIL,MOTRIN) 200 MG tablet Take 200 mg by mouth as needed.      . loratadine (CLARITIN) 10 MG tablet Take 10 mg by mouth daily as needed for allergies.    . Probiotic Product (ALIGN) 4 MG CAPS Take 1 capsule by mouth daily.    . pseudoephedrine-guaifenesin (MUCINEX D) 60-600 MG per tablet Take 1 tablet by mouth every 12 (twelve) hours as needed.      . sodium chloride (OCEAN) 0.65 % SOLN nasal spray Place 1  spray into both nostrils as needed for congestion.     No current facility-administered medications on file prior to visit.    The PMH, PSH, Social History, Family History, Medications, and allergies have been reviewed in [facility], and have been updated if relevant.  ROS: Review of Systems  Constitutional: Negative.   HENT: Negative.   Respiratory: Negative.   Cardiovascular: Negative.   Gastrointestinal: Negative for diarrhea.  Endocrine: Negative.   Genitourinary: Negative.   Musculoskeletal: Negative.   Skin: Negative.   Allergic/Immunologic: Negative.   Neurological: Negative.   Hematological: Negative.   Psychiatric/Behavioral: Negative.   All other systems reviewed and are negative.   Physical exam: BP 124/70   Pulse 96    Ht 5\' 1"  (1.549 m)   Wt 152 lb (68.9 kg)   SpO2 97%   BMI 28.72 kg/m    General:  Well-developed,well-nourished,in no acute distress; alert,appropriate and cooperative throughout examination Head:  normocephalic and atraumatic.   Eyes:  vision grossly intact, pupils equal, pupils round, and pupils reactive to light.   Ears:  R ear normal and L ear normal.   Nose:  no external deformity.   Mouth:  good dentition.   Neck:  No deformities, masses, or tenderness noted. Lungs:  Normal respiratory effort, chest expands symmetrically. Lungs are clear to auscultation, no crackles or wheezes. Heart:  Normal rate and regular rhythm. S1 and S2 normal without gallop, murmur, click, rub or other extra sounds. Abdomen:  Bowel sounds positive,abdomen soft and non-tender without masses, organomegaly or hernias noted. Msk:  No deformity or scoliosis noted of thoracic or lumbar spine.   Extremities:  No clubbing, cyanosis, edema, or deformity noted with normal full range of motion of all joints.   Neurologic:  alert & oriented X3 and gait normal.   Skin:  Intact without suspicious lesions or rashes Cervical Nodes:  No lymphadenopathy noted Axillary Nodes:  No palpable lymphadenopathy Psych:  Cognition and judgment appear intact. Alert and cooperative with normal attention span and concentration. No apparent delusions, illusions, hallucinations

## 2016-12-01 NOTE — Progress Notes (Signed)
Pre visit review using our clinic review tool, if applicable. No additional management support is needed unless otherwise documented below in the visit note. 

## 2016-12-01 NOTE — Assessment & Plan Note (Signed)
Well controlled on current dose of pravachol. No changes made. 

## 2016-12-29 ENCOUNTER — Encounter: Payer: Self-pay | Admitting: Family Medicine

## 2017-01-28 ENCOUNTER — Other Ambulatory Visit: Payer: Self-pay | Admitting: Family Medicine

## 2017-01-28 DIAGNOSIS — E2839 Other primary ovarian failure: Secondary | ICD-10-CM

## 2017-01-29 ENCOUNTER — Ambulatory Visit
Admission: RE | Admit: 2017-01-29 | Discharge: 2017-01-29 | Disposition: A | Discharge status: Home / Self Care | Payer: Medicare Other | Source: Ambulatory Visit | Attending: Family Medicine | Admitting: Family Medicine

## 2017-01-29 ENCOUNTER — Other Ambulatory Visit: Payer: Medicare Other

## 2017-01-29 DIAGNOSIS — E2839 Other primary ovarian failure: Secondary | ICD-10-CM

## 2017-05-19 HISTORY — PX: MOHS SURGERY: SUR867

## 2017-09-09 ENCOUNTER — Encounter: Payer: Medicare Other | Admitting: Family Medicine

## 2017-09-15 ENCOUNTER — Ambulatory Visit (INDEPENDENT_AMBULATORY_CARE_PROVIDER_SITE_OTHER): Payer: Medicare Other | Admitting: Family Medicine

## 2017-09-15 ENCOUNTER — Encounter: Payer: Self-pay | Admitting: Family Medicine

## 2017-09-15 VITALS — BP 118/78 | HR 82 | Temp 98.4°F | Ht 61.0 in | Wt 152.4 lb

## 2017-09-15 DIAGNOSIS — E785 Hyperlipidemia, unspecified: Secondary | ICD-10-CM

## 2017-09-15 DIAGNOSIS — Z01419 Encounter for gynecological examination (general) (routine) without abnormal findings: Secondary | ICD-10-CM

## 2017-09-15 DIAGNOSIS — Z720 Tobacco use: Secondary | ICD-10-CM | POA: Diagnosis not present

## 2017-09-15 DIAGNOSIS — Z23 Encounter for immunization: Secondary | ICD-10-CM | POA: Diagnosis not present

## 2017-09-15 LAB — COMPREHENSIVE METABOLIC PANEL
ALK PHOS: 79 U/L (ref 39–117)
ALT: 15 U/L (ref 0–35)
AST: 21 U/L (ref 0–37)
Albumin: 4.2 g/dL (ref 3.5–5.2)
BILIRUBIN TOTAL: 0.5 mg/dL (ref 0.2–1.2)
BUN: 9 mg/dL (ref 6–23)
CO2: 28 mEq/L (ref 19–32)
CREATININE: 0.6 mg/dL (ref 0.40–1.20)
Calcium: 9.2 mg/dL (ref 8.4–10.5)
Chloride: 103 mEq/L (ref 96–112)
GFR: 104.35 mL/min (ref >60.00)
GLUCOSE: 96 mg/dL (ref 70–99)
POTASSIUM: 4.3 meq/L (ref 3.5–5.1)
SODIUM: 140 meq/L (ref 135–145)
TOTAL PROTEIN: 6.8 g/dL (ref 6.0–8.3)

## 2017-09-15 LAB — LIPID PANEL
Cholesterol: 208 mg/dL — ABNORMAL HIGH (ref 0–200)
HDL: 76.7 mg/dL (ref >39.00)
LDL Cholesterol: 103 mg/dL — ABNORMAL HIGH (ref 0–99)
NONHDL: 130.98
Total CHOL/HDL Ratio: 3
Triglycerides: 138 mg/dL (ref 0.0–149.0)
VLDL: 27.6 mg/dL (ref 0.0–40.0)

## 2017-09-15 LAB — TSH: TSH: 1.98 u[IU]/mL (ref 0.35–4.50)

## 2017-09-15 NOTE — Progress Notes (Signed)
72 yo pleasant female here for CPX and follow up of chronic medical conditions.    Health Maintenance  Topic Date Due  . Hepatitis C Screening  11/27/2017 (Originally 07/17/1945)  . INFLUENZA VACCINE  12/17/2017  . MAMMOGRAM  01/29/2018  . TETANUS/TDAP  09/02/2020  . COLONOSCOPY  01/21/2021  . DEXA SCAN  Completed  . PNA vac Low Risk Adult  Completed    HLD- taking Pravachol 10 mg qhs. Lab Results  Component Value Date   CHOL 142 11/27/2016   HDL 72.70 11/27/2016   LDLCALC 52 11/27/2016   LDLDIRECT 116.2 03/14/2011   TRIG 84.0 11/27/2016   CHOLHDL 2 11/27/2016      Patient Active Problem List   Diagnosis Date Noted  . Well woman exam 12/01/2016  . Diverticulitis 12/01/2016  . Internal and external bleeding hemorrhoids 10/03/2016  . IBS (irritable bowel syndrome) 11/27/2015  . Hyperglycemia 11/21/2013  . Varicose veins of lower extremities with other complications 11/16/1599  . HLD (hyperlipidemia) 03/06/2010  . Allergic rhinitis 03/06/2010   Past Medical History:  Diagnosis Date  . Allergy    SINUS  . Cancer ([place])   . Colon polyps 11/14/2013   Tubular adenoma, hyperplastic polyps  . Diverticulosis 11/15/2010   Moderate  . External hemorrhoids   . Hyperlipidemia   . IBS (irritable bowel syndrome)    Past Surgical History:  Procedure Laterality Date  . BREAST BIOPSY    . COLONOSCOPY    . GANGLION CYST EXCISION    . HAMMER TOE SURGERY    . LEEP      twice, in [place]  . POLYPECTOMY    . TONSILLECTOMY    . TUBAL LIGATION     Social History   Tobacco Use  . Smoking status: Current Every Day Smoker    Packs/day: 1.00    Types: Cigarettes  . Smokeless tobacco: Never Used  Substance Use Topics  . Alcohol use: Yes    Alcohol/week: 0.6 oz    Types: 1 Glasses of wine per week    Comment: SOCIAL  . Drug use: No   Family History  Problem Relation Age of Onset  . Cancer Mother   . Asthma Father   . Brain cancer Sister   . Colon cancer Cousin     Allergies  Allergen Reactions  . Avelox [Moxifloxacin Hcl In Nacl] Other (See Comments)    Severe stomach cramping and nausea  . Penicillins Rash   Current Outpatient Medications on File Prior to Visit  Medication Sig Dispense Refill  . acetaminophen (TYLENOL) 500 MG tablet Take 500 mg by mouth as needed.    Marland Kitchen aspirin 81 MG tablet Take 81 mg by mouth daily.      Marland Kitchen HYDROcodone-acetaminophen (NORCO/VICODIN) 5-325 MG tablet Take 1 tablet by mouth every 6 (six) hours as needed. for pain  0  . hyoscyamine (LEVBID) 0.375 MG 12 hr tablet Take 1 tablet (0.375 mg total) by mouth every 12 (twelve) hours as needed. 60 tablet 2  . ibuprofen (ADVIL,MOTRIN) 200 MG tablet Take 200 mg by mouth as needed.      . loratadine (CLARITIN) 10 MG tablet Take 10 mg by mouth daily as needed for allergies.    . pravastatin (PRAVACHOL) 10 MG tablet Take 1 tablet (10 mg total) by mouth every evening. 90 tablet 3  . Probiotic Product (ALIGN) 4 MG CAPS Take 1 capsule by mouth daily.    . pseudoephedrine-guaifenesin (MUCINEX D) 60-600 MG per tablet Take 1 tablet  by mouth every 12 (twelve) hours as needed.      . sodium chloride (OCEAN) 0.65 % SOLN nasal spray Place 1 spray into both nostrils as needed for congestion.     No current facility-administered medications on file prior to visit.    The PMH, PSH, Social History, Family History, Medications, and allergies have been reviewed in [facility], and have been updated if relevant.  ROS: Review of Systems  Constitutional: Negative.   HENT: Negative.   Respiratory: Negative.   Cardiovascular: Negative.   Gastrointestinal: Negative for diarrhea.  Endocrine: Negative.   Genitourinary: Negative.   Musculoskeletal: Negative.   Skin: Negative.   Allergic/Immunologic: Negative.   Neurological: Negative.   Hematological: Negative.   Psychiatric/Behavioral: Negative.   All other systems reviewed and are negative.   Physical exam: BP 118/78 (BP Location: Left Arm,  Patient Position: Sitting, Cuff Size: Normal)   Pulse 82   Temp 98.4 F (36.9 C) (Oral)   Ht 5\' 1"  (1.549 m)   Wt 152 lb 6.4 oz (69.1 kg)   SpO2 95%   BMI 28.80 kg/m     General:  Well-developed,well-nourished,in no acute distress; alert,appropriate and cooperative throughout examination Head:  normocephalic and atraumatic.   Eyes:  vision grossly intact, PERRL Ears:  R ear normal and L ear normal externally, TMs clear bilaterally Nose:  no external deformity.   Mouth:  good dentition.   Neck:  No deformities, masses, or tenderness noted. Lungs:  Normal respiratory effort, chest expands symmetrically. Lungs are clear to auscultation, no crackles or wheezes. Heart:  Normal rate and regular rhythm. S1 and S2 normal without gallop, murmur, click, rub or other extra sounds. Abdomen:  Bowel sounds positive,abdomen soft and non-tender without masses, organomegaly or hernias noted. Msk:  No deformity or scoliosis noted of thoracic or lumbar spine.   Extremities:  No clubbing, cyanosis, edema, or deformity noted with normal full range of motion of all joints.   Neurologic:  alert & oriented X3 and gait normal.   Skin:  Intact without suspicious lesions or rashes Cervical Nodes:  No lymphadenopathy noted Axillary Nodes:  No palpable lymphadenopathy Psych:  Cognition and judgment appear intact. Alert and cooperative with normal attention span and concentration. No apparent delusions, illusions, hallucinations

## 2017-09-15 NOTE — Assessment & Plan Note (Signed)
Reviewed preventive care protocols, scheduled due services, and updated immunizations Discussed nutrition, exercise, diet, and healthy lifestyle.  Orders Placed This Encounter  Procedures  . Varicella-zoster vaccine IM (Shingrix)  . Comprehensive metabolic panel  . Lipid panel  . TSH  . Ambulatory Referral for Lung Cancer Scre

## 2017-09-15 NOTE — Patient Instructions (Addendum)
Great to see you. I will call you with your lab results from today and you can view them online.   We will call you about lung cancer screening.

## 2017-09-15 NOTE — Assessment & Plan Note (Signed)
Continue current dose of statin. Labs today. 

## 2017-09-18 ENCOUNTER — Other Ambulatory Visit: Payer: Self-pay | Admitting: Acute Care

## 2017-09-18 DIAGNOSIS — F17210 Nicotine dependence, cigarettes, uncomplicated: Secondary | ICD-10-CM

## 2017-09-18 DIAGNOSIS — Z122 Encounter for screening for malignant neoplasm of respiratory organs: Secondary | ICD-10-CM

## 2017-09-30 ENCOUNTER — Encounter: Payer: Self-pay | Admitting: Acute Care

## 2017-09-30 ENCOUNTER — Ambulatory Visit (INDEPENDENT_AMBULATORY_CARE_PROVIDER_SITE_OTHER): Payer: Medicare Other | Admitting: Acute Care

## 2017-09-30 ENCOUNTER — Ambulatory Visit (INDEPENDENT_AMBULATORY_CARE_PROVIDER_SITE_OTHER)
Admission: RE | Admit: 2017-09-30 | Discharge: 2017-09-30 | Disposition: A | Discharge status: Home / Self Care | Payer: Medicare Other | Source: Ambulatory Visit | Attending: Acute Care | Admitting: Acute Care

## 2017-09-30 DIAGNOSIS — F17210 Nicotine dependence, cigarettes, uncomplicated: Secondary | ICD-10-CM

## 2017-09-30 DIAGNOSIS — Z122 Encounter for screening for malignant neoplasm of respiratory organs: Secondary | ICD-10-CM

## 2017-09-30 NOTE — Progress Notes (Signed)
Shared Decision Making Visit Lung Cancer Screening Program [phone])   Eligibility:  Age 72 y.o.  Pack Years Smoking History Calculation 52 pack year smoking history (# packs/per year x # years smoked)  Recent History of coughing up blood  no  Unexplained weight loss? no ( >Than 15 pounds within the last 6 months )  Prior History Lung / other cancer no (Diagnosis within the last 5 years already requiring surveillance chest CT Scans).  Smoking Status Current Smoker  Former Smokers: Years since quit:NA  Quit Date: NA  Visit Components:  Discussion included one or more decision making aids. yes  Discussion included risk/benefits of screening. yes  Discussion included potential follow up diagnostic testing for abnormal scans. yes  Discussion included meaning and risk of over diagnosis. yes  Discussion included meaning and risk of False Positives. yes  Discussion included meaning of total radiation exposure. yes  Counseling Included:  Importance of adherence to annual lung cancer LDCT screening. yes  Impact of comorbidities on ability to participate in the program. yes  Ability and willingness to under diagnostic treatment. yes  Smoking Cessation Counseling:  Current Smokers:   Discussed importance of smoking cessation. yes  Information about tobacco cessation classes and interventions provided to patient. yes  Patient provided with "ticket" for LDCT Scan. yes  Symptomatic Patient. no  Counseling  Diagnosis Code: Tobacco Use Z72.0  Asymptomatic Patient yes  Counseling (Intermediate counseling: > three minutes counseling) Q5956  Former Smokers:   Discussed the importance of maintaining cigarette abstinence. yes  Diagnosis Code: Personal History of Nicotine Dependence. L87.564  Information about tobacco cessation classes and interventions provided to patient. Yes  Patient provided with "ticket" for LDCT Scan. yes  Written Order for Lung Cancer  Screening with LDCT placed in Epic. Yes (CT Chest Lung Cancer Screening Low Dose W/O CM) [id number] Z12.2-Screening of respiratory organs Z87.891-Personal history of nicotine dependence  I have spent 25 minutes of face to face time with Abigail Willis discussing the risks and benefits of lung cancer screening. We viewed a power point together that explained in detail the above noted topics. We paused at intervals to allow for questions to be asked and answered to ensure understanding.We discussed that the single most powerful action that she can take to decrease her risk of developing lung cancer is to quit smoking. We discussed whether or not she is ready to commit to setting a quit date. We discussed options for tools to aid in quitting smoking including nicotine replacement therapy, non-nicotine medications, support groups, Quit Smart classes, and behavior modification. We discussed that often times setting smaller, more achievable goals, such as eliminating 1 cigarette a day for a week and then 2 cigarettes a day for a week can be helpful in slowly decreasing the number of cigarettes smoked. This allows for a sense of accomplishment as well as providing a clinical benefit. I gave her the " Be Stronger Than Your Excuses" card with contact information for community resources, classes, free nicotine replacement therapy, and access to mobile apps, text messaging, and on-line smoking cessation help. I have also given her my card and contact information in the event she needs to contact me. We discussed the time and location of the scan, and that either Doroteo Glassman RN or I will call with the results within 24-48 hours of receiving them. I have offered her  a copy of the power point we viewed  as a resource in the event they need reinforcement of the  concepts we discussed today in the office. The patient verbalized understanding of all of  the above and had no further questions upon leaving the office. They have my  contact information in the event they have any further questions.  I spent 4 minutes counseling on smoking cessation and the health risks of continued tobacco abuse.  I explained to the patient that there has been a high incidence of coronary artery disease noted on these exams. I explained that this is a non-gated exam therefore degree or severity cannot be determined. This patient is currently on statin therapy. I have asked the patient to follow-up with their PCP regarding any incidental finding of coronary artery disease and management with diet or medication as their PCP  feels is clinically indicated. The patient verbalized understanding of the above and had no further questions upon completion of the visit.      Abigail Spatz, NP 09/30/2017 1:20 PM

## 2017-10-07 ENCOUNTER — Other Ambulatory Visit: Payer: Self-pay | Admitting: Acute Care

## 2017-10-07 DIAGNOSIS — Z122 Encounter for screening for malignant neoplasm of respiratory organs: Secondary | ICD-10-CM

## 2017-10-07 DIAGNOSIS — F17210 Nicotine dependence, cigarettes, uncomplicated: Secondary | ICD-10-CM

## 2017-11-09 ENCOUNTER — Other Ambulatory Visit: Payer: Self-pay | Admitting: Family Medicine

## 2017-11-09 DIAGNOSIS — Z1231 Encounter for screening mammogram for malignant neoplasm of breast: Secondary | ICD-10-CM

## 2017-11-12 ENCOUNTER — Other Ambulatory Visit: Payer: Self-pay | Admitting: Family Medicine

## 2017-12-23 ENCOUNTER — Ambulatory Visit
Admission: RE | Admit: 2017-12-23 | Discharge: 2017-12-23 | Disposition: A | Discharge status: Home / Self Care | Payer: Medicare Other | Source: Ambulatory Visit | Attending: Family | Admitting: Family

## 2017-12-23 ENCOUNTER — Encounter: Payer: Self-pay | Admitting: Family

## 2017-12-23 ENCOUNTER — Telehealth: Payer: Self-pay | Admitting: Family Medicine

## 2017-12-23 ENCOUNTER — Ambulatory Visit: Payer: Medicare Other | Admitting: Family

## 2017-12-23 VITALS — BP 142/72 | HR 76 | Temp 98.6°F | Resp 16 | Wt 156.1 lb

## 2017-12-23 DIAGNOSIS — M7989 Other specified soft tissue disorders: Secondary | ICD-10-CM | POA: Insufficient documentation

## 2017-12-23 NOTE — Patient Instructions (Signed)
Ultrasound of lower legs  Conservative therapy- compression stockings, elevate legs  If persistent, discuss with Dr Deborra Medina whether or not an as needed diuretic is next step    Edema Edema is when you have too much fluid in your body or under your skin. Edema may make your legs, feet, and ankles swell up. Swelling is also common in looser tissues, like around your eyes. This is a common condition. It gets more common as you get older. There are many possible causes of edema. Eating too much salt (sodium) and being on your feet or sitting for a long time can cause edema in your legs, feet, and ankles. Hot weather may make edema worse. Edema is usually painless. Your skin may look swollen or shiny. Follow these instructions at home:  Keep the swollen body part raised (elevated) above the level of your heart when you are sitting or lying down.  Do not sit still or stand for a long time.  Do not wear tight clothes. Do not wear garters on your upper legs.  Exercise your legs. This can help the swelling go down.  Wear elastic bandages or support stockings as told by your doctor.  Eat a low-salt (low-sodium) diet to reduce fluid as told by your doctor.  Depending on the cause of your swelling, you may need to limit how much fluid you drink (fluid restriction).  Take over-the-counter and prescription medicines only as told by your doctor. Contact a doctor if:  Treatment is not working.  You have heart, liver, or kidney disease and have symptoms of edema.  You have sudden and unexplained weight gain. Get help right away if:  You have shortness of breath or chest pain.  You cannot breathe when you lie down.  You have pain, redness, or warmth in the swollen areas.  You have heart, liver, or kidney disease and get edema all of a sudden.  You have a fever and your symptoms get worse all of a sudden. Summary  Edema is when you have too much fluid in your body or under your  skin.  Edema may make your legs, feet, and ankles swell up. Swelling is also common in looser tissues, like around your eyes.  Raise (elevate) the swollen body part above the level of your heart when you are sitting or lying down.  Follow your doctor's instructions about diet and how much fluid you can drink (fluid restriction). This information is not intended to replace advice given to you by your health care provider. Make sure you discuss any questions you have with your health care provider. Document Released: 10/22/2007 Document Revised: 05/23/2016 Document Reviewed: 05/23/2016 Elsevier Interactive Patient Education  2017 [employer].

## 2017-12-23 NOTE — Progress Notes (Signed)
Subjective:    Patient ID: Abigail Willis, female    DOB: Mar 12, 1946, 72 y.o.   MRN: [id number]  CC: Abigail Willis is a 72 y.o. female who presents today for an acute visit.    HPI: Bilateral leg swelling x 10 days, improved.  On river cruise for 10 days, noticed half way through trip, returned on flight 5 days ago A lot of walking and notes temperature was hot. Worse after long periods of sitting upright.   No numbness, fever, sob, palpations, cp, lacerations, wounds, orthopnea. No h/o CHF   Notes h/o of legs getting 'puffy' on long airplanes   Left swells more than right.   Smoker  No h/o dvt, recent surgeries, immobilization.   H/o varicose veins. No vascular surgery   HISTORY:  Past Medical History:  Diagnosis Date  . Allergy    SINUS  . Cancer ([place])   . Colon polyps 11/14/2013   Tubular adenoma, hyperplastic polyps  . Diverticulosis 11/15/2010   Moderate  . External hemorrhoids   . Hyperlipidemia   . IBS (irritable bowel syndrome)    Past Surgical History:  Procedure Laterality Date  . BREAST BIOPSY    . COLONOSCOPY    . GANGLION CYST EXCISION    . HAMMER TOE SURGERY    . LEEP      twice, in [place]  . POLYPECTOMY    . TONSILLECTOMY    . TUBAL LIGATION     Family History  Problem Relation Age of Onset  . Cancer Mother   . Asthma Father   . Brain cancer Sister   . Colon cancer Cousin     Allergies: Avelox [moxifloxacin hcl in nacl] and Penicillins Current Outpatient Medications on File Prior to Visit  Medication Sig Dispense Refill  . acetaminophen (TYLENOL) 500 MG tablet Take 500 mg by mouth as needed.    Marland Kitchen aspirin 81 MG tablet Take 81 mg by mouth daily.      . hyoscyamine (LEVBID) 0.375 MG 12 hr tablet Take 1 tablet (0.375 mg total) by mouth every 12 (twelve) hours as needed. 60 tablet 2  . ibuprofen (ADVIL,MOTRIN) 200 MG tablet Take 200 mg by mouth as needed.      . loratadine (CLARITIN) 10 MG tablet Take 10 mg by mouth daily as  needed for allergies.    . pravastatin (PRAVACHOL) 10 MG tablet TAKE 1 TABLET BY MOUTH EVERY EVENING 90 tablet 2  . Probiotic Product (ALIGN) 4 MG CAPS Take 1 capsule by mouth daily.    . pseudoephedrine-guaifenesin (MUCINEX D) 60-600 MG per tablet Take 1 tablet by mouth every 12 (twelve) hours as needed.      . sodium chloride (OCEAN) 0.65 % SOLN nasal spray Place 1 spray into both nostrils as needed for congestion.     No current facility-administered medications on file prior to visit.     Social History   Tobacco Use  . Smoking status: Current Every Day Smoker    Packs/day: 1.00    Years: 52.00    Pack years: 52.00    Types: Cigarettes  . Smokeless tobacco: Never Used  Substance Use Topics  . Alcohol use: Yes    Alcohol/week: 0.6 oz    Types: 1 Glasses of wine per week    Comment: SOCIAL  . Drug use: No    Review of Systems  Constitutional: Negative for chills and fever.  Respiratory: Negative for cough, shortness of breath and wheezing.  Cardiovascular: Positive for leg swelling. Negative for chest pain and palpitations.  Gastrointestinal: Negative for nausea and vomiting.      Objective:    BP (!) 142/72 (BP Location: Left Arm, Patient Position: Sitting, Cuff Size: Normal)   Pulse 76   Temp 98.6 F (37 C) (Oral)   Resp 16   Wt 156 lb 2 oz (70.8 kg)   SpO2 97%   BMI 29.50 kg/m    Physical Exam  Constitutional: She appears well-developed and well-nourished.  Eyes: Conjunctivae are normal.  Cardiovascular: Normal rate, regular rhythm, normal heart sounds and normal pulses.  No LE edema, palpable cords or masses. No erythema or increased warmth. No asymmetry in calf size when compared bilaterally.  LE hair growth symmetric and present. No discoloration of varicosities noted. LE warm and palpable pedal pulses.   Pulmonary/Chest: Effort normal and breath sounds normal. She has no wheezes. She has no rhonchi. She has no rales.  Neurological: She is alert.    Skin: Skin is warm and dry.  Psychiatric: She has a normal mood and affect. Her speech is normal and behavior is normal. Thought content normal.  Vitals reviewed.      Assessment & Plan:   Problem List Items Addressed This Visit      Other   Leg swelling - Primary    Reassuring exam. Due to recent travel and smoking history, pending [place]. Discussed venous insufficiency with patient. Given rx for compression stockings and advised to follow up with PCP if swelling becomes bothersome especially while traveling, perhaps prn diuretic would be an appropriate next step.       Relevant Orders   DME Other see comment   US Venous Img Lower Bilateral        I have discontinued Abigail Willis's HYDROcodone-acetaminophen. I am also having her maintain her aspirin, ibuprofen, pseudoephedrine-guaifenesin, acetaminophen, loratadine, ALIGN, sodium chloride, hyoscyamine, and pravastatin.   No orders of the defined types were placed in this encounter.   Return precautions given.   Risks, benefits, and alternatives of the medications and treatment plan prescribed today were discussed, and patient expressed understanding.   Education regarding symptom management and diagnosis given to patient on AVS.  Continue to follow with Lucille Passy, MD for routine health maintenance.   Abigail Willis and I agreed with plan.   Mable Paris, FNP

## 2017-12-23 NOTE — Telephone Encounter (Signed)
noted 

## 2017-12-23 NOTE — Assessment & Plan Note (Signed)
Reassuring exam. Due to recent travel and smoking history, pending BL [place]. Discussed venous insufficiency with patient. Given rx for compression stockings and advised to follow up with PCP if swelling becomes bothersome especially while traveling, perhaps prn diuretic would be an appropriate next step.

## 2017-12-23 NOTE — Telephone Encounter (Signed)
Butch Penny with [facility] called to say the ultrasound of lower extremities negative for DVT, result read back and verified. Results in chart.

## 2018-02-10 ENCOUNTER — Ambulatory Visit
Admission: RE | Admit: 2018-02-10 | Discharge: 2018-02-10 | Disposition: A | Discharge status: Home / Self Care | Payer: Medicare Other | Source: Ambulatory Visit | Attending: Family Medicine | Admitting: Family Medicine

## 2018-02-10 DIAGNOSIS — Z1231 Encounter for screening mammogram for malignant neoplasm of breast: Secondary | ICD-10-CM

## 2018-07-16 IMAGING — CT CT ABD-PELV W/ CM
2 of 5 series · 16 of 46 positions shown, 18 images · IV contrast (APPLIED)
Comparison: None.

CLINICAL DATA: Bilateral lower abdominal pain for 1 week with
diarrhea. History of diverticulitis.

EXAM:
CT ABDOMEN AND PELVIS WITH CONTRAST
TECHNIQUE: Multidetector CT imaging of the abdomen and pelvis was performed
using the standard protocol following bolus administration of
intravenous contrast.
CONTRAST:  100mL 4WXHCS-2UU IOPAMIDOL (4WXHCS-2UU) INJECTION 61%

[Series 2: routine abd/pel with · axial · 0.76mm/px · z∈[-367,-7]mm · 13 of 80 slices shown, 15 images]
[im 4/80  soft-tissue]
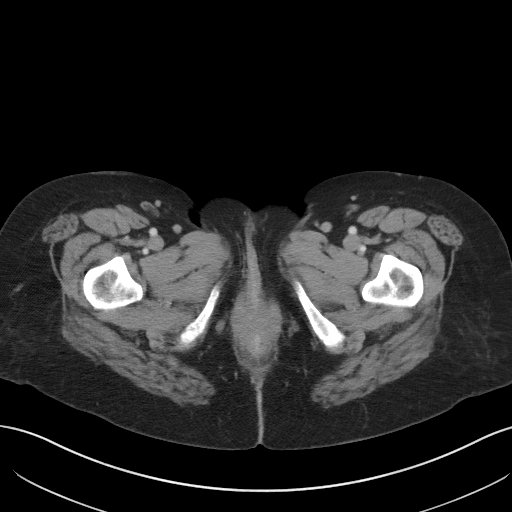
[im 4/80  bone]
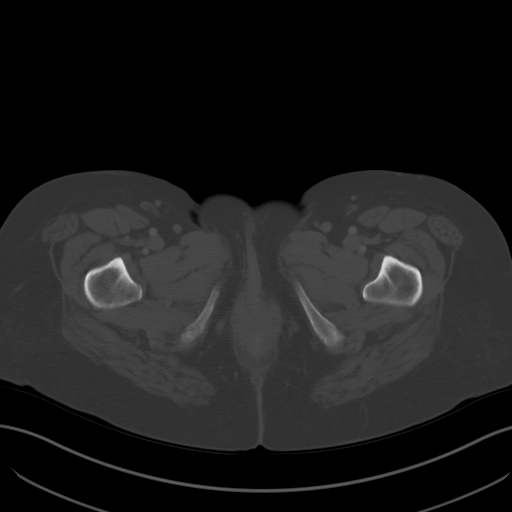
[im 12/80  soft-tissue]
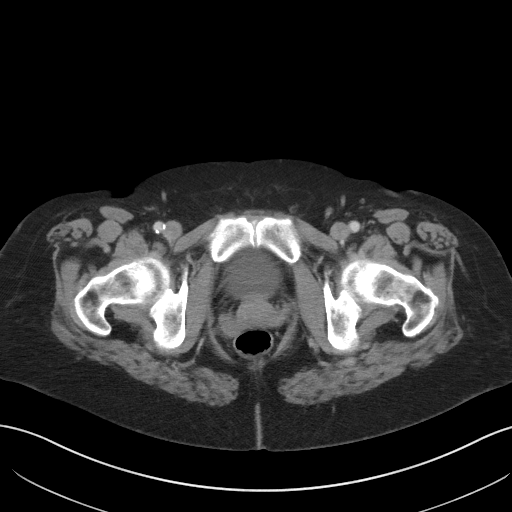
[im 16/80  soft-tissue]
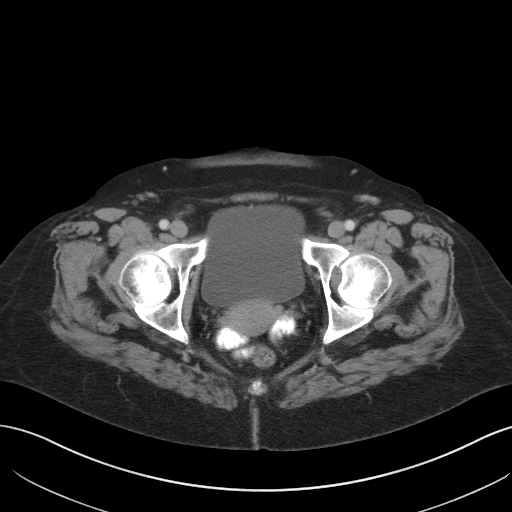
[im 24/80  soft-tissue]
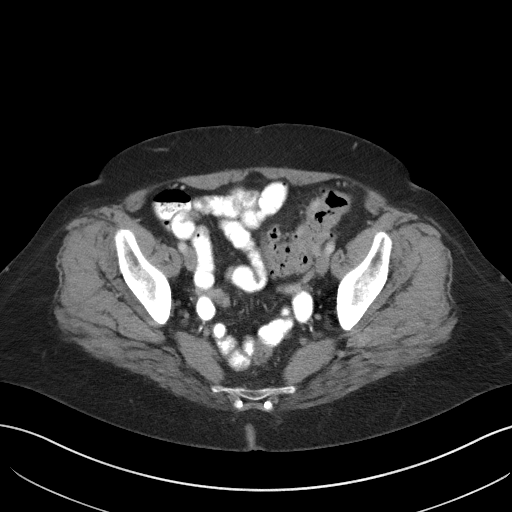
[im 28/80  soft-tissue]
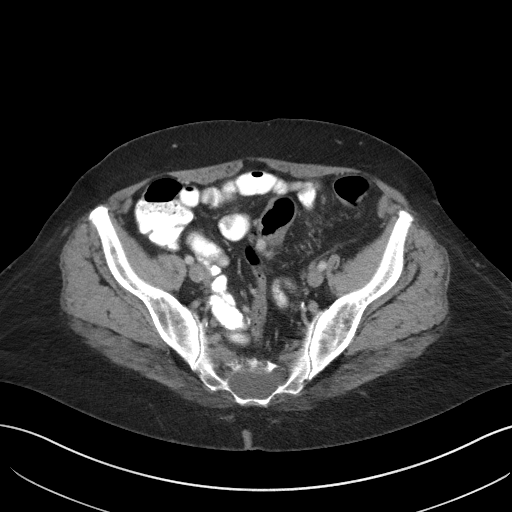
[im 36/80  soft-tissue]
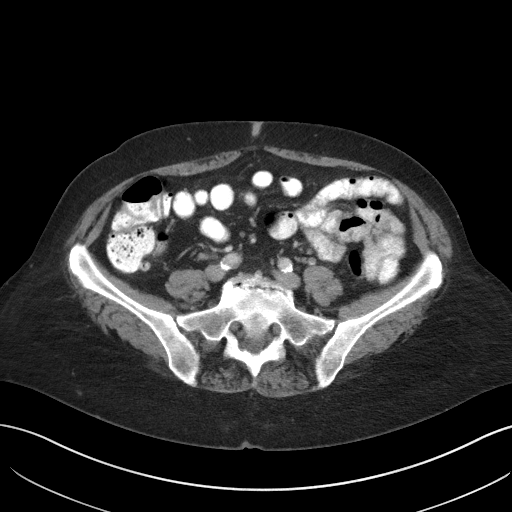
[im 40/80  soft-tissue]
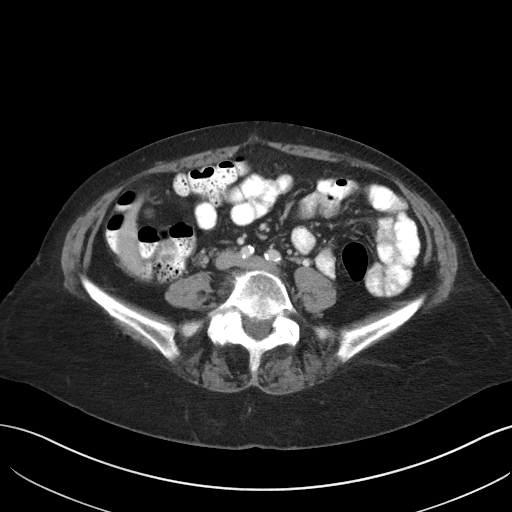
[im 44/80  soft-tissue]
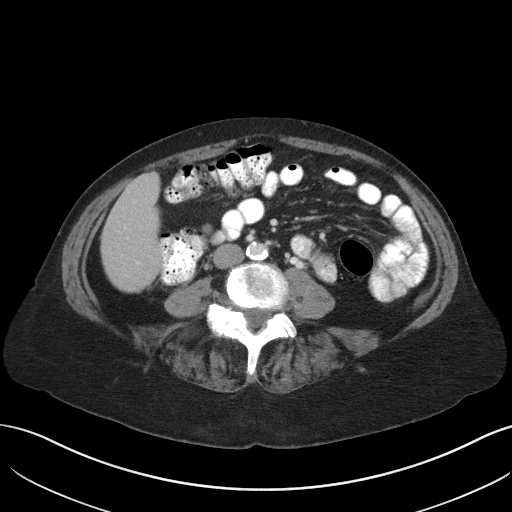
[im 52/80  soft-tissue]
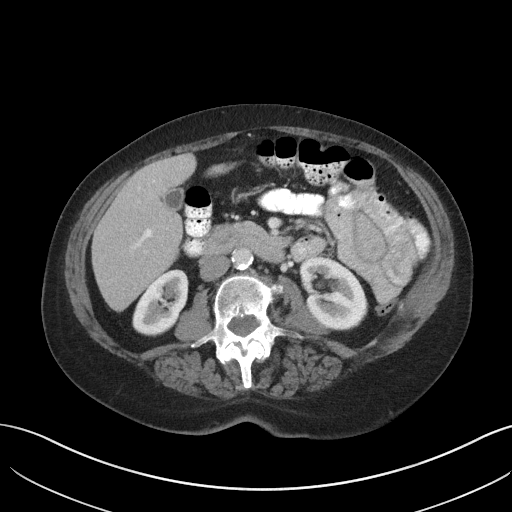
[im 52/80  bone]
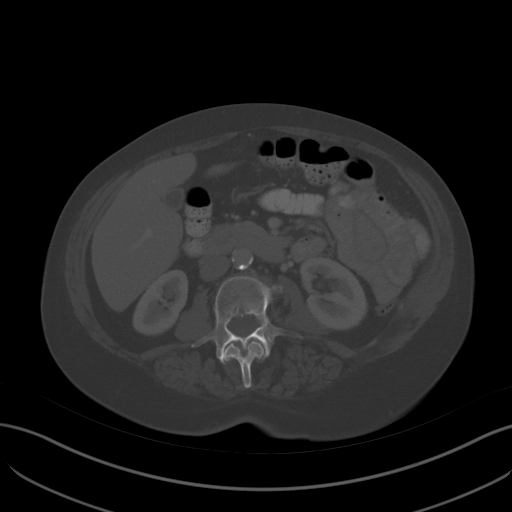
[im 56/80  soft-tissue]
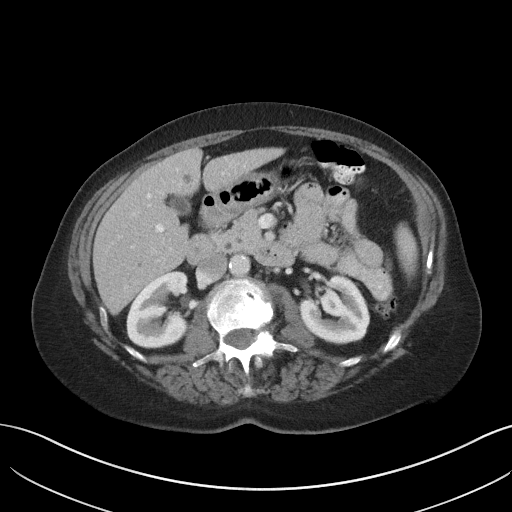
[im 64/80  soft-tissue]
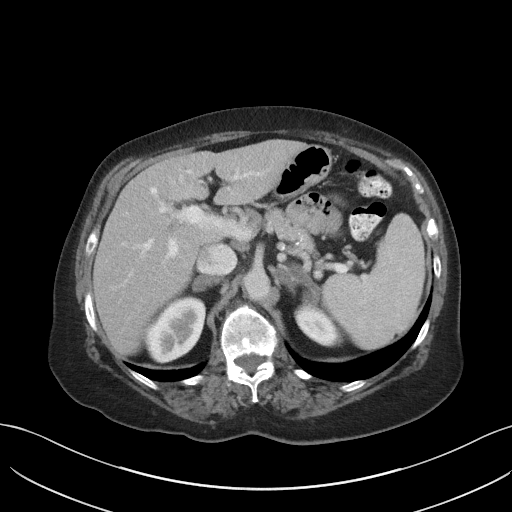
[im 68/80  soft-tissue]
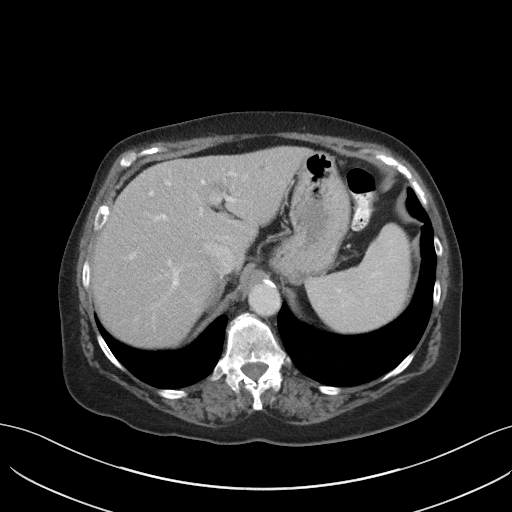
[im 76/80  soft-tissue]
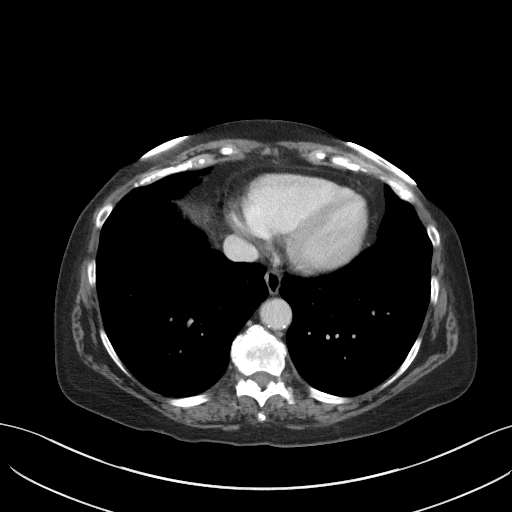

[Series 5: coronal st · coronal · 0.68mm/px · 3 of 86 slices shown]
[im 29/86  soft-tissue]
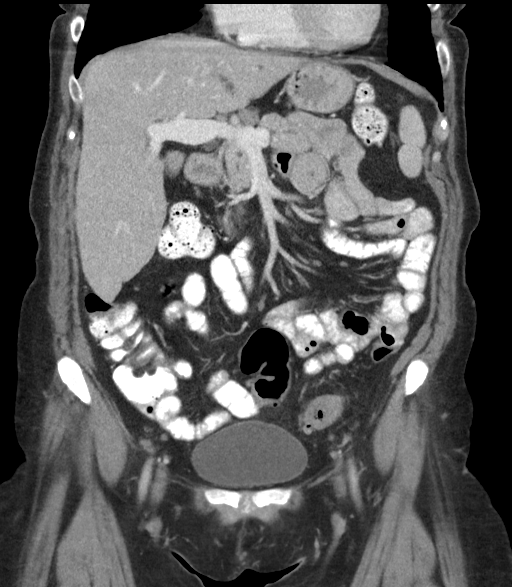
[im 38/86  soft-tissue]
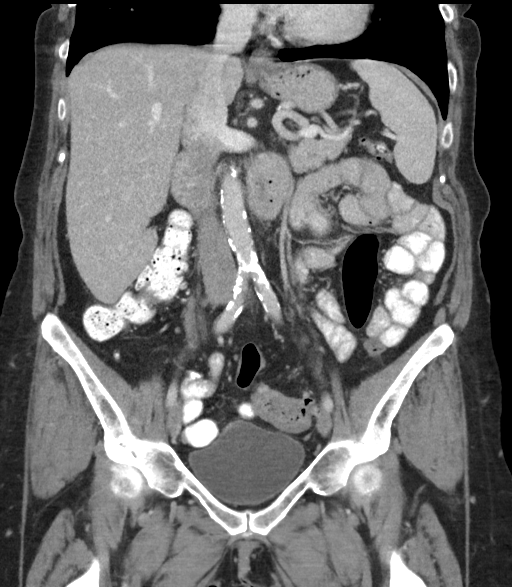
[im 48/86  soft-tissue]
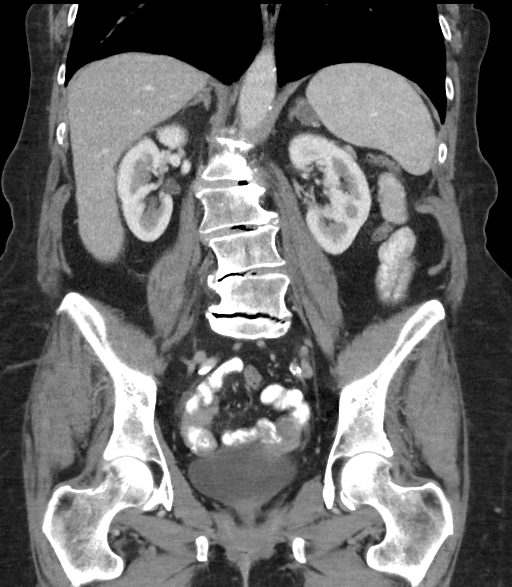

[16 of 46 positions shown; findings below may reference images not displayed]

FINDINGS: Lower chest: The lung bases are clear of acute process. No pleural
effusion or pulmonary nodules. The heart is normal in size. No
pericardial effusion. The distal esophagus is grossly normal. There
is a small hiatal hernia.

Hepatobiliary: Wedge-shaped area of peripheral contrast enhancement
in segment 8 is most likely a benign vascular shunt. No mass or mass
effect. Small scattered low-attenuation lesions in both lobes of the
liver are likely benign cysts. The gallbladder is normal. No common
bile duct dilatation.

Pancreas: No mass, inflammation or ductal dilatation.

Spleen: Normal size.  No focal lesions.

Adrenals/Urinary Tract: There are bilateral adrenal gland masses.
These are stable since a chest CT from 1477 and are most likely
benign adenomas.

No worrisome renal lesions. No renal, ureteral or bladder calculi.
No bladder lesions.

Stomach/Bowel: The stomach, duodenum and small bowel are
unremarkable. No acute inflammatory process, mass lesions or
obstructive findings. The terminal ileum is normal. The appendix is
normal. There is moderate upper sigmoid diverticulosis along with
mild wall thickening and subtle inflammation suggesting
diverticulitis.

Vascular/Lymphatic: The advanced atherosclerotic calcifications
involving the aorta and branch vessels. No aneurysm or dissection.
The branch vessels are patent. The major venous structures are
patent.

No mesenteric or retroperitoneal mass or adenopathy.

Reproductive: The uterus and ovaries are normal. Tubal ligation
clips are noted bilaterally.

Other: No pelvic mass or free pelvic fluid collections. No inguinal
mass or adenopathy.

Musculoskeletal: Large Tarlov cyst or pseudomeningocele the
involving the sacrum with mild pressure erosion of the sacrum.

Severe degenerative lumbar spondylosis with multilevel disc disease
and facet disease.
IMPRESSION: 1. Mild diverticulitis involving the upper sigmoid colon. No
complicating features such as abscess or free air.
2. Stable bilateral adrenal gland lesions consistent with benign
adenomas.
3. Wedge-shaped area of peripheral enhancement in segment 8 of the
liver most consistent with a benign vascular shunt.
4. Numerous small low-attenuation hepatic lesions, likely benign
cysts.
5. Advanced atherosclerotic calcifications involving the aorta and
branch vessels.

## 2018-09-20 ENCOUNTER — Encounter: Payer: Medicare Other | Admitting: Family Medicine

## 2018-11-09 ENCOUNTER — Other Ambulatory Visit: Payer: Self-pay | Admitting: *Deleted

## 2018-11-09 DIAGNOSIS — F17210 Nicotine dependence, cigarettes, uncomplicated: Secondary | ICD-10-CM

## 2018-11-09 DIAGNOSIS — Z87891 Personal history of nicotine dependence: Secondary | ICD-10-CM

## 2018-11-09 DIAGNOSIS — Z122 Encounter for screening for malignant neoplasm of respiratory organs: Secondary | ICD-10-CM

## 2018-11-18 ENCOUNTER — Telehealth: Payer: Self-pay

## 2018-11-18 NOTE — Telephone Encounter (Signed)
LMOVM for pt to RTC to prescreen/thx dmf

## 2018-11-19 NOTE — Progress Notes (Signed)
Very pleasant 73 yo female is here for CPX and follow up of chronic medical conditions.  Doing well.   Due for mammogram and colonoscopy in 01/2019.  Sees dermatologist 1-2 a year in [place].  Just saw him.  No post menopausal bleeding.  Has lost weight, not intentional, but is snacking less. Has blood in her stool but often does because of her hemorrhoids.  Last colonoscopy was in 01/2016- 3 year recall.  Health Maintenance  Topic Date Due  . Hepatitis C Screening  [birth year]  . INFLUENZA VACCINE  12/18/2018  . MAMMOGRAM  02/11/2019  . TETANUS/TDAP  09/02/2020  . COLONOSCOPY  01/21/2021  . DEXA SCAN  Completed  . PNA vac Low Risk Adult  Completed    HLD- taking Pravachol 10 mg qhs. Lab Results  Component Value Date   CHOL 208 (H) 09/15/2017   HDL 76.70 09/15/2017   LDLCALC 103 (H) 09/15/2017   LDLDIRECT 116.2 03/14/2011   TRIG 138.0 09/15/2017   CHOLHDL 3 09/15/2017     Lab Results  Component Value Date   ALT 15 09/15/2017   AST 21 09/15/2017   ALKPHOS 79 09/15/2017   BILITOT 0.5 09/15/2017   Lab Results  Component Value Date   TSH 1.98 09/15/2017   Lab Results  Component Value Date   HGBA1C 5.5 11/21/2014      Patient Active Problem List   Diagnosis Date Noted  . Leg swelling 12/23/2017  . Well woman exam 12/01/2016  . Internal and external bleeding hemorrhoids 10/03/2016  . IBS (irritable bowel syndrome) 11/27/2015  . Hyperglycemia 11/21/2013  . Varicose veins of lower extremities with other complications 16/02/9603  . HLD (hyperlipidemia) 03/06/2010  . Allergic rhinitis 03/06/2010   Past Medical History:  Diagnosis Date  . Allergy    SINUS  . Cancer ([place])   . Colon polyps 11/14/2013   Tubular adenoma, hyperplastic polyps  . Diverticulosis 11/15/2010   Moderate  . External hemorrhoids   . Hyperlipidemia   . IBS (irritable bowel syndrome)    Past Surgical History:  Procedure Laterality Date  . BREAST BIOPSY    . COLONOSCOPY    .  GANGLION CYST EXCISION    . HAMMER TOE SURGERY    . LEEP      twice, in [place]  . POLYPECTOMY    . TONSILLECTOMY    . TUBAL LIGATION     Social History   Tobacco Use  . Smoking status: Current Every Day Smoker    Packs/day: 1.00    Years: 52.00    Pack years: 52.00    Types: Cigarettes  . Smokeless tobacco: Never Used  Substance Use Topics  . Alcohol use: Yes    Alcohol/week: 1.0 standard drinks    Types: 1 Glasses of wine per week    Comment: SOCIAL  . Drug use: No   Family History  Problem Relation Age of Onset  . Cancer Mother   . Asthma Father   . Brain cancer Sister   . Colon cancer Cousin   . Breast cancer Neg Hx    Allergies  Allergen Reactions  . Avelox [Moxifloxacin Hcl In Nacl] Other (See Comments)    Severe stomach cramping and nausea  . Penicillins Rash   Current Outpatient Medications on File Prior to Visit  Medication Sig Dispense Refill  . acetaminophen (TYLENOL) 500 MG tablet Take 500 mg by mouth as needed.    Marland Kitchen aspirin 81 MG tablet Take 81 mg by mouth daily.      Marland Kitchen  hyoscyamine (LEVBID) 0.375 MG 12 hr tablet Take 1 tablet (0.375 mg total) by mouth every 12 (twelve) hours as needed. 60 tablet 2  . ibuprofen (ADVIL,MOTRIN) 200 MG tablet Take 200 mg by mouth as needed.      . loratadine (CLARITIN) 10 MG tablet Take 10 mg by mouth daily as needed for allergies.    . pravastatin (PRAVACHOL) 10 MG tablet TAKE 1 TABLET BY MOUTH EVERY EVENING 90 tablet 2  . Probiotic Product (ALIGN) 4 MG CAPS Take 1 capsule by mouth daily.    . pseudoephedrine-guaifenesin (MUCINEX D) 60-600 MG per tablet Take 1 tablet by mouth every 12 (twelve) hours as needed.      . sodium chloride (OCEAN) 0.65 % SOLN nasal spray Place 1 spray into both nostrils as needed for congestion.     No current facility-administered medications on file prior to visit.    The PMH, PSH, Social History, Family History, Medications, and allergies have been reviewed in [facility], and have been updated if  relevant.  ROS: Review of Systems  Constitutional: Positive for fatigue.  HENT: Negative.   Respiratory: Negative.   Cardiovascular: Negative.   Gastrointestinal: Negative for diarrhea.  Endocrine: Negative.   Genitourinary: Negative.   Musculoskeletal: Negative.   Skin: Negative.   Allergic/Immunologic: Negative.   Neurological: Negative.   Hematological: Negative.   Psychiatric/Behavioral: Negative.   All other systems reviewed and are negative.   Physical Exam  Constitutional: She is oriented to person, place, and time and well-developed, well-nourished, and in no distress. No distress.  HENT:  Head: Normocephalic and atraumatic.  Eyes: Pupils are equal, round, and reactive to light.  Neck: Neck supple.  Cardiovascular: Normal rate and regular rhythm.  Pulmonary/Chest: Effort normal and breath sounds normal.  Abdominal: Soft.  Musculoskeletal: Normal range of motion.  Neurological: She is alert and oriented to person, place, and time.  Skin: Skin is warm and dry. She is not diaphoretic.  Psychiatric: Mood, memory, affect and judgment normal.  Nursing note and vitals reviewed. BP 122/72   Pulse 74   Temp 97.6 F (36.4 C) (Oral)   Ht 5\' 1"  (1.549 m)   Wt 148 lb 9.6 oz (67.4 kg)   SpO2 96%   BMI 28.08 kg/m    Wt Readings from Last 3 Encounters:  11/22/18 148 lb 9.6 oz (67.4 kg)  12/23/17 156 lb 2 oz (70.8 kg)  09/15/17 152 lb 6.4 oz (69.1 kg)

## 2018-11-22 ENCOUNTER — Ambulatory Visit (INDEPENDENT_AMBULATORY_CARE_PROVIDER_SITE_OTHER): Payer: Medicare Other | Admitting: Family Medicine

## 2018-11-22 ENCOUNTER — Encounter: Payer: Self-pay | Admitting: Family Medicine

## 2018-11-22 VITALS — BP 122/72 | HR 74 | Temp 97.6°F | Ht 61.0 in | Wt 148.6 lb

## 2018-11-22 DIAGNOSIS — Z01419 Encounter for gynecological examination (general) (routine) without abnormal findings: Secondary | ICD-10-CM

## 2018-11-22 DIAGNOSIS — R5383 Other fatigue: Secondary | ICD-10-CM | POA: Diagnosis not present

## 2018-11-22 DIAGNOSIS — Z1211 Encounter for screening for malignant neoplasm of colon: Secondary | ICD-10-CM | POA: Diagnosis not present

## 2018-11-22 DIAGNOSIS — R739 Hyperglycemia, unspecified: Secondary | ICD-10-CM

## 2018-11-22 DIAGNOSIS — E785 Hyperlipidemia, unspecified: Secondary | ICD-10-CM | POA: Diagnosis not present

## 2018-11-22 DIAGNOSIS — Z1159 Encounter for screening for other viral diseases: Secondary | ICD-10-CM | POA: Diagnosis not present

## 2018-11-22 DIAGNOSIS — Z1239 Encounter for other screening for malignant neoplasm of breast: Secondary | ICD-10-CM

## 2018-11-22 LAB — CBC WITH DIFFERENTIAL/PLATELET
Basophils Absolute: 0 10*3/uL (ref 0.0–0.1)
Basophils Relative: 0.5 % (ref 0.0–3.0)
Eosinophils Absolute: 0.3 10*3/uL (ref 0.0–0.7)
Eosinophils Relative: 3.1 % (ref 0.0–5.0)
HCT: 47.5 % — ABNORMAL HIGH (ref 36.0–46.0)
Hemoglobin: 16 g/dL — ABNORMAL HIGH (ref 12.0–15.0)
Lymphocytes Relative: 26.1 % (ref 12.0–46.0)
Lymphs Abs: 2.2 10*3/uL (ref 0.7–4.0)
MCHC: 33.7 g/dL (ref 30.0–36.0)
MCV: 91.7 fl (ref 78.0–100.0)
Monocytes Absolute: 0.6 10*3/uL (ref 0.1–1.0)
Monocytes Relative: 7.5 % (ref 3.0–12.0)
Neutro Abs: 5.2 10*3/uL (ref 1.4–7.7)
Neutrophils Relative %: 62.8 % (ref 43.0–77.0)
Platelets: 260 10*3/uL (ref 150.0–400.0)
RBC: 5.17 Mil/uL — ABNORMAL HIGH (ref 3.87–5.11)
RDW: 13.2 % (ref 11.5–15.5)
WBC: 8.4 10*3/uL (ref 4.0–10.5)

## 2018-11-22 LAB — COMPREHENSIVE METABOLIC PANEL
ALT: 13 U/L (ref 0–35)
AST: 17 U/L (ref 0–37)
Albumin: 4.2 g/dL (ref 3.5–5.2)
Alkaline Phosphatase: 82 U/L (ref 39–117)
BUN: 8 mg/dL (ref 6–23)
CO2: 29 mEq/L (ref 19–32)
Calcium: 9.1 mg/dL (ref 8.4–10.5)
Chloride: 104 mEq/L (ref 96–112)
Creatinine, Ser: 0.63 mg/dL (ref 0.40–1.20)
GFR: 92.5 mL/min (ref >60.00)
Glucose, Bld: 101 mg/dL — ABNORMAL HIGH (ref 70–99)
Potassium: 5.1 mEq/L (ref 3.5–5.1)
Sodium: 141 mEq/L (ref 135–145)
Total Bilirubin: 0.4 mg/dL (ref 0.2–1.2)
Total Protein: 6.6 g/dL (ref 6.0–8.3)

## 2018-11-22 LAB — LIPID PANEL
Cholesterol: 189 mg/dL (ref 0–200)
HDL: 73.8 mg/dL (ref >39.00)
LDL Cholesterol: 95 mg/dL (ref 0–99)
NonHDL: 115.47
Total CHOL/HDL Ratio: 3
Triglycerides: 102 mg/dL (ref 0.0–149.0)
VLDL: 20.4 mg/dL (ref 0.0–40.0)

## 2018-11-22 LAB — HEMOGLOBIN A1C: Hgb A1c MFr Bld: 5.5 % (ref 4.6–6.5)

## 2018-11-22 LAB — VITAMIN D 25 HYDROXY (VIT D DEFICIENCY, FRACTURES): VITD: 38.85 ng/mL (ref 30.00–100.00)

## 2018-11-22 LAB — TSH: TSH: 1.72 u[IU]/mL (ref 0.35–4.50)

## 2018-11-22 LAB — VITAMIN B12: Vitamin B-12: 378 pg/mL (ref 211–911)

## 2018-11-22 LAB — FERRITIN: Ferritin: 70.9 ng/mL (ref 10.0–291.0)

## 2018-11-22 NOTE — Patient Instructions (Addendum)
Great to see you. I will call you with your lab results from today and you can view them online.   Please call Dr. Eugenia Pancoast office to schedule your colonoscopy.  Please call the breast center at [phone] to schedule your mammogram.

## 2018-11-22 NOTE — Assessment & Plan Note (Signed)
Reviewed preventive care protocols, scheduled due services, and updated immunizations Discussed nutrition, exercise, diet, and healthy lifestyle.  Mammogram ordered, along with colonoscopy.  She will call to schedule both for September.

## 2018-11-22 NOTE — Assessment & Plan Note (Signed)
Compliant with Pravachol. Due for labs.  Orders Placed This Encounter  Procedures  . MM 3D SCREEN BREAST BILATERAL  . Hepatitis C Antibody  . CBC with Differential/Platelet  . Comprehensive metabolic panel  . Lipid panel  . TSH  . Hemoglobin A1c  . B12  . Vitamin D (25 hydroxy)  . Ferritin  . Ambulatory referral to Gastroenterology

## 2018-11-22 NOTE — Assessment & Plan Note (Signed)
Likely multifactorial but will check labs that could possibly contribute to fatigue. The patient indicates understanding of these issues and agrees with the plan.  Orders Placed This Encounter  Procedures  . MM 3D SCREEN BREAST BILATERAL  . Hepatitis C Antibody  . CBC with Differential/Platelet  . Comprehensive metabolic panel  . Lipid panel  . TSH  . Hemoglobin A1c  . B12  . Vitamin D (25 hydroxy)  . Ferritin  . Ambulatory referral to Gastroenterology

## 2018-11-23 ENCOUNTER — Other Ambulatory Visit: Payer: Self-pay | Admitting: Family Medicine

## 2018-11-23 ENCOUNTER — Encounter: Payer: Self-pay | Admitting: Family Medicine

## 2018-11-23 LAB — HEPATITIS C ANTIBODY
Hepatitis C Ab: NONREACTIVE
SIGNAL TO CUT-OFF: 0.02 (ref <1.00)

## 2018-11-23 MED ORDER — PRAVASTATIN SODIUM 10 MG PO TABS: 10.0000 mg | ORAL_TABLET | Freq: Every evening | ORAL | 2 refills | Status: DC

## 2018-11-24 ENCOUNTER — Other Ambulatory Visit: Payer: Self-pay | Admitting: Family Medicine

## 2018-11-24 DIAGNOSIS — Z1231 Encounter for screening mammogram for malignant neoplasm of breast: Secondary | ICD-10-CM

## 2018-12-14 ENCOUNTER — Emergency Department: Payer: Medicare Other

## 2018-12-14 ENCOUNTER — Encounter: Payer: Self-pay | Admitting: Emergency Medicine

## 2018-12-14 ENCOUNTER — Emergency Department
Admission: EM | Admit: 2018-12-14 | Discharge: 2018-12-14 | Disposition: A | Discharge status: Home / Self Care | Payer: Medicare Other | Attending: Emergency Medicine | Admitting: Emergency Medicine

## 2018-12-14 ENCOUNTER — Other Ambulatory Visit: Payer: Self-pay

## 2018-12-14 DIAGNOSIS — Z79899 Other long term (current) drug therapy: Secondary | ICD-10-CM | POA: Insufficient documentation

## 2018-12-14 DIAGNOSIS — Z7982 Long term (current) use of aspirin: Secondary | ICD-10-CM | POA: Insufficient documentation

## 2018-12-14 DIAGNOSIS — R079 Chest pain, unspecified: Secondary | ICD-10-CM | POA: Insufficient documentation

## 2018-12-14 DIAGNOSIS — F17210 Nicotine dependence, cigarettes, uncomplicated: Secondary | ICD-10-CM | POA: Diagnosis not present

## 2018-12-14 LAB — COMPREHENSIVE METABOLIC PANEL
ALT: 17 U/L (ref 0–44)
AST: 21 U/L (ref 15–41)
Albumin: 4.1 g/dL (ref 3.5–5.0)
Alkaline Phosphatase: 76 U/L (ref 38–126)
Anion gap: 8 (ref 5–15)
BUN: 7 mg/dL — ABNORMAL LOW (ref 8–23)
CO2: 27 mmol/L (ref 22–32)
Calcium: 8.9 mg/dL (ref 8.9–10.3)
Chloride: 104 mmol/L (ref 98–111)
Creatinine, Ser: 0.52 mg/dL (ref 0.44–1.00)
GFR calc Af Amer: >60 mL/min (ref >60)
GFR calc non Af Amer: >60 mL/min (ref >60)
Glucose, Bld: 106 mg/dL — ABNORMAL HIGH (ref 70–99)
Potassium: 3.9 mmol/L (ref 3.5–5.1)
Sodium: 139 mmol/L (ref 135–145)
Total Bilirubin: 0.6 mg/dL (ref 0.3–1.2)
Total Protein: 7.4 g/dL (ref 6.5–8.1)

## 2018-12-14 LAB — TROPONIN I (HIGH SENSITIVITY)
Troponin I (High Sensitivity): 4 ng/L (ref <18)
Troponin I (High Sensitivity): 3 ng/L (ref <18)

## 2018-12-14 LAB — CBC
HCT: 47.7 % — ABNORMAL HIGH (ref 36.0–46.0)
Hemoglobin: 15.9 g/dL — ABNORMAL HIGH (ref 12.0–15.0)
MCH: 30.5 pg (ref 26.0–34.0)
MCHC: 33.3 g/dL (ref 30.0–36.0)
MCV: 91.4 fL (ref 80.0–100.0)
Platelets: 248 10*3/uL (ref 150–400)
RBC: 5.22 MIL/uL — ABNORMAL HIGH (ref 3.87–5.11)
RDW: 12.9 % (ref 11.5–15.5)
WBC: 6.6 10*3/uL (ref 4.0–10.5)
nRBC: 0 % (ref 0.0–0.2)

## 2018-12-14 LAB — LIPASE, BLOOD: Lipase: 44 U/L (ref 11–51)

## 2018-12-14 NOTE — ED Triage Notes (Signed)
C?O left chest pain that started this morning while showering.  States pain has since resolved.  AAOx3.  Skin warm and dry. NAD

## 2018-12-14 NOTE — ED Notes (Signed)
Patient transported to X-ray 

## 2018-12-14 NOTE — ED Notes (Signed)
Pt resting w/ significant other at bedside. No known needs at this time.

## 2018-12-14 NOTE — ED Provider Notes (Signed)
[facility] Emergency Department Provider Note  Time seen: 9:04 AM  I have reviewed the triage vital signs and the nursing notes.   HISTORY  Chief Complaint Chest Pain    HPI Abigail Willis is a 73 y.o. female with a past medical history of IBS, hyperlipidemia, presents to the emergency department for chest pain.  According to the patient this morning while getting out of the shower she had sudden onset of left-sided chest pain which she describes as sharp moderate to severe in intensity lasting approximately 15 to 20 minutes before resolving.  Denies any shortness of breath nausea or diaphoresis.  No history of cardiac disease per patient.  No cough or fever.  Patient denies any symptoms at this time.  States it "scared her" more than anything.   Past Medical History:  Diagnosis Date  . Allergy    SINUS  . Cancer ([place])   . Colon polyps 11/14/2013   Tubular adenoma, hyperplastic polyps  . Diverticulosis 11/15/2010   Moderate  . External hemorrhoids   . Hyperlipidemia   . IBS (irritable bowel syndrome)     Patient Active Problem List   Diagnosis Date Noted  . Fatigue 11/22/2018  . Well woman exam 12/01/2016  . Internal and external bleeding hemorrhoids 10/03/2016  . IBS (irritable bowel syndrome) 11/27/2015  . Hyperglycemia 11/21/2013  . Varicose veins of lower extremities with other complications 02/77/4128  . HLD (hyperlipidemia) 03/06/2010  . Allergic rhinitis 03/06/2010    Past Surgical History:  Procedure Laterality Date  . BREAST BIOPSY    . COLONOSCOPY    . GANGLION CYST EXCISION    . HAMMER TOE SURGERY    . LEEP      twice, in [place]  . POLYPECTOMY    . TONSILLECTOMY    . TUBAL LIGATION      Prior to Admission medications   Medication Sig Start Date End Date Taking? Authorizing Provider  acetaminophen (TYLENOL) 500 MG tablet Take 500 mg by mouth as needed.    [provider]  aspirin 81 MG tablet Take 81 mg by mouth  daily.      [provider]  hyoscyamine (LEVBID) 0.375 MG 12 hr tablet Take 1 tablet (0.375 mg total) by mouth every 12 (twelve) hours as needed. 10/02/16   Gatha Mayer, MD  ibuprofen (ADVIL,MOTRIN) 200 MG tablet Take 200 mg by mouth as needed.      [provider]  loratadine (CLARITIN) 10 MG tablet Take 10 mg by mouth daily as needed for allergies.    [provider]  pravastatin (PRAVACHOL) 10 MG tablet Take 1 tablet (10 mg total) by mouth every evening. 11/23/18   Lucille Passy, MD  Probiotic Product (ALIGN) 4 MG CAPS Take 1 capsule by mouth daily.    [provider]  pseudoephedrine-guaifenesin (MUCINEX D) 60-600 MG per tablet Take 1 tablet by mouth every 12 (twelve) hours as needed.      [provider]  sodium chloride (OCEAN) 0.65 % SOLN nasal spray Place 1 spray into both nostrils as needed for congestion.    [provider]    Allergies  Allergen Reactions  . Avelox [Moxifloxacin Hcl In Nacl] Other (See Comments)    Severe stomach cramping and nausea  . Penicillins Rash    Family History  Problem Relation Age of Onset  . Cancer Mother   . Asthma Father   . Brain cancer Sister   . Colon cancer  Cousin   . Breast cancer Neg Hx     Social History Social History   Tobacco Use  . Smoking status: Current Every Day Smoker    Packs/day: 1.00    Years: 52.00    Pack years: 52.00    Types: Cigarettes  . Smokeless tobacco: Never Used  Substance Use Topics  . Alcohol use: Yes    Alcohol/week: 1.0 standard drinks    Types: 1 Glasses of wine per week    Comment: SOCIAL  . Drug use: No    Review of Systems Constitutional: Negative for fever. Cardiovascular: Positive for chest pain. Respiratory: Negative for shortness of breath.  Negative for cough. Gastrointestinal: Negative for abdominal pain, vomiting  Musculoskeletal: Negative for musculoskeletal complaints Skin: Negative for skin complaints  Neurological:  Negative for headache All other ROS negative  ____________________________________________   PHYSICAL EXAM:  VITAL SIGNS: ED Triage Vitals  Enc Vitals Group     BP 12/14/18 0838 (!) 155/62     Pulse Rate 12/14/18 0838 70     Resp 12/14/18 0838 20     Temp 12/14/18 0838 99.1 F (37.3 C)     Temp Source 12/14/18 0838 Oral     SpO2 12/14/18 0838 96 %     Weight 12/14/18 0838 149 lb (67.6 kg)     Height 12/14/18 0838 5\' 1"  (1.549 m)     Head Circumference --      Peak Flow --      Pain Score 12/14/18 0842 0     Pain Loc --      Pain Edu? --      Excl. in [place]? --    Constitutional: Alert and oriented. Well appearing and in no distress. Eyes: Normal exam ENT      Head: Normocephalic and atraumatic.      Mouth/Throat: Mucous membranes are moist. Cardiovascular: Normal rate, regular rhythm. No murmur Respiratory: Normal respiratory effort without tachypnea nor retractions. Breath sounds are clear  Gastrointestinal: Soft and nontender. No distention.  Musculoskeletal: Nontender with normal range of motion in all extremities. Neurologic:  Normal speech and language. No gross focal neurologic deficits Skin:  Skin is warm, dry and intact.  Psychiatric: Mood and affect are normal.   ____________________________________________    EKG  EKG viewed and interpreted by myself shows a normal sinus rhythm at 67 bpm with a narrow QRS, normal axis, normal intervals, no concerning ST changes.  ____________________________________________    RADIOLOGY  Mild emphysema.  No acute abnormality.  ____________________________________________   INITIAL IMPRESSION / ASSESSMENT AND PLAN / ED COURSE  Pertinent labs & imaging results that were available during my care of the patient were reviewed by me and considered in my medical decision making (see chart for details).   Patient presents emergency department for acute onset of chest pain while getting out of the shower this morning.   Differential at this time would include ACS, angina, chest wall pain, pneumonia, pneumothorax, esophageal spasm, intercostal or muscular spasm.  We will check labs including cardiac enzymes likely will require 2 sets of cardiac enzymes, chest x-ray.  Reassuringly patient is EKG appears well.  Patient denies any symptoms at this time.  Repeat troponin remains negative.  Patient remains symptom-free.  Overall appears very well.  We will discharge from the emergency department PCP follow-up.  I discussed my normal chest pain return precautions.  Abigail Willis was evaluated in Emergency Department on 12/14/2018 for the symptoms described in the history  of present illness. She was evaluated in the context of the global COVID-19 pandemic, which necessitated consideration that the patient might be at risk for infection with the SARS-CoV-2 virus that causes COVID-19. Institutional protocols and algorithms that pertain to the evaluation of patients at risk for COVID-19 are in a state of rapid change based on information released by regulatory bodies including the CDC and federal and state organizations. These policies and algorithms were followed during the patient's care in the ED.  ____________________________________________   FINAL CLINICAL IMPRESSION(S) / ED DIAGNOSES  Chest pain   Harvest Dark, MD 12/14/18 1218

## 2018-12-21 ENCOUNTER — Inpatient Hospital Stay: Admission: RE | Admit: 2018-12-21 | Payer: Medicare Other | Source: Ambulatory Visit

## 2018-12-28 ENCOUNTER — Ambulatory Visit (INDEPENDENT_AMBULATORY_CARE_PROVIDER_SITE_OTHER)
Admission: RE | Admit: 2018-12-28 | Discharge: 2018-12-28 | Disposition: A | Discharge status: Home / Self Care | Payer: Medicare Other | Source: Ambulatory Visit | Attending: Acute Care | Admitting: Acute Care

## 2018-12-28 ENCOUNTER — Telehealth: Payer: Self-pay

## 2018-12-28 ENCOUNTER — Other Ambulatory Visit: Payer: Self-pay

## 2018-12-28 DIAGNOSIS — F17210 Nicotine dependence, cigarettes, uncomplicated: Secondary | ICD-10-CM

## 2018-12-28 DIAGNOSIS — Z09 Encounter for follow-up examination after completed treatment for conditions other than malignant neoplasm: Secondary | ICD-10-CM | POA: Insufficient documentation

## 2018-12-28 DIAGNOSIS — Z720 Tobacco use: Secondary | ICD-10-CM | POA: Insufficient documentation

## 2018-12-28 DIAGNOSIS — J439 Emphysema, unspecified: Secondary | ICD-10-CM | POA: Insufficient documentation

## 2018-12-28 DIAGNOSIS — Z87891 Personal history of nicotine dependence: Secondary | ICD-10-CM

## 2018-12-28 DIAGNOSIS — R079 Chest pain, unspecified: Secondary | ICD-10-CM | POA: Insufficient documentation

## 2018-12-28 DIAGNOSIS — I700 Atherosclerosis of aorta: Secondary | ICD-10-CM | POA: Insufficient documentation

## 2018-12-28 DIAGNOSIS — E785 Hyperlipidemia, unspecified: Secondary | ICD-10-CM | POA: Insufficient documentation

## 2018-12-28 DIAGNOSIS — Z122 Encounter for screening for malignant neoplasm of respiratory organs: Secondary | ICD-10-CM

## 2018-12-28 NOTE — Progress Notes (Signed)
Subjective:   Patient ID: Abigail Willis, female    DOB: [birth year], 73 y.o.   MRN: [id number]  Abigail Willis is a pleasant 73 y.o. year old female with past medical history of IBS, hyperlipidemia, tobacco abuse, who presents to clinic today with Hospitalization Follow-up (Pt screened at vehicle. She is here today for a Hospital F/U. She was seen at [facility] on 7.28.20 for CP. Plz see imaging results.)  on 12/29/2018  HPI:  Chest pain- Chart reviewed.  Presented to [facility] on 12/14/18 with chest pain.    According to the ED notes, Abigail Willis had an acute onset of left-sided chest pain which she describes as sharp moderate to severe in intensity lasting approximately 15 to 20 minutes before resolving as she was getting out of the shower on the morning that she presented to the ED. Unsure if worsened by exertion or relieved by rest as it self resolved within 20 minutes.  Denied any shortness of breath nausea or diaphoresis.  No history of cardiac disease per patient.  No cough or fever.  Patient denies any symptoms at this time.  States it "scared her" more than anything.  EKG  EKG  showed a normal sinus rhythm at 67 bpm with a narrow QRS, normal axis, normal intervals, no concerning ST changes.  Cycled troponins were negative.  CXR neg other than emphysema. She was symptom free throughout her ED stay.  CLINICAL DATA:  Left-sided chest pain since this morning.  EXAM: CHEST - 2 VIEW  COMPARISON:  Chest CT 09/30/2017  FINDINGS: The cardiac silhouette, mediastinal and hilar contours are within normal limits and stable. There is mild tortuosity and calcification of the thoracic aorta. Mild stable emphysematous changes and pulmonary scarring. No acute overlying pulmonary process. No pleural effusions. The bony thorax is intact.  IMPRESSION: Mild emphysematous changes and pulmonary scarring but no acute overlying pulmonary process.   Has had no further Chest pain.  Tobacco-  smokes a pack a day for 50 years and has never quit.  Since her ED visit, she did undergo her Lung CA screening chest CT yesterday:   CLINICAL DATA:  73 year old asymptomatic female current smoker with 53 pack-year smoking history.  EXAM: CT CHEST WITHOUT CONTRAST LOW-DOSE FOR LUNG CANCER SCREENING  TECHNIQUE: Multidetector CT imaging of the chest was performed following the standard protocol without IV contrast.  COMPARISON:  09/30/2017 screening chest CT.  FINDINGS: Cardiovascular: Normal heart size. No significant pericardial effusion/thickening. Left anterior descending and right coronary atherosclerosis. Atherosclerotic nonaneurysmal thoracic aorta. Normal caliber pulmonary arteries.  Mediastinum/Nodes: No discrete thyroid nodules. Unremarkable esophagus. No pathologically enlarged axillary, mediastinal or hilar lymph nodes, noting limited sensitivity for the detection of hilar adenopathy on this noncontrast study.  Lungs/Pleura: No pneumothorax. No pleural effusion. Mild centrilobular and paraseptal emphysema. No acute consolidative airspace disease or lung masses. No significant growth of previously visualized scattered bilateral pulmonary nodules. No new significant pulmonary nodules.  Upper abdomen: Generalized thickening of the adrenal glands bilaterally is unchanged, compatible with adrenal hyperplasia.  Musculoskeletal: No aggressive appearing focal osseous lesions. Marked thoracic spondylosis.  IMPRESSION: 1. Lung-RADS 2, benign appearance or behavior. Continue annual screening with low-dose chest CT without contrast in 12 months. 2. Two-vessel coronary atherosclerosis.  Aortic and two vessel coronary Atherosclerosis-  The 10-year ASCVD risk score Abigail Bussing DC Jr., et al., 2013) is: 18.8%   Values used to calculate the score:     Age: 73 years     Sex: Female  Is Non-Hispanic African American: No     Diabetic: No     Tobacco smoker: Yes      Systolic Blood Pressure: 865 mmHg     Is BP treated: No     HDL Cholesterol: 73.8 mg/dL     Total Cholesterol: 189 mg/dL   HLD- she does take Pravachol 10 mg daily along with ASA 81 mg daily.   Lab Results  Component Value Date   CHOL 189 11/22/2018   HDL 73.80 11/22/2018   LDLCALC 95 11/22/2018   LDLDIRECT 116.2 03/14/2011   TRIG 102.0 11/22/2018   CHOLHDL 3 11/22/2018   Current Outpatient Medications on File Prior to Visit  Medication Sig Dispense Refill  . acetaminophen (TYLENOL) 500 MG tablet Take 500 mg by mouth as needed.    Marland Kitchen aspirin 81 MG tablet Take 81 mg by mouth daily.      . hyoscyamine (LEVBID) 0.375 MG 12 hr tablet Take 1 tablet (0.375 mg total) by mouth every 12 (twelve) hours as needed. 60 tablet 2  . ibuprofen (ADVIL,MOTRIN) 200 MG tablet Take 200 mg by mouth as needed.      . pravastatin (PRAVACHOL) 10 MG tablet Take 1 tablet (10 mg total) by mouth every evening. 90 tablet 2  . Probiotic Product (ALIGN) 4 MG CAPS Take 1 capsule by mouth daily.    . pseudoephedrine-guaifenesin (MUCINEX D) 60-600 MG per tablet Take 1 tablet by mouth every 12 (twelve) hours as needed.      . sodium chloride (OCEAN) 0.65 % SOLN nasal spray Place 1 spray into both nostrils as needed for congestion.     No current facility-administered medications on file prior to visit.     Allergies  Allergen Reactions  . Avelox [Moxifloxacin Hcl In Nacl] Other (See Comments)    Severe stomach cramping and nausea  . Penicillins Rash    Past Medical History:  Diagnosis Date  . Allergy    SINUS  . Cancer ([place])   . Colon polyps 11/14/2013   Tubular adenoma, hyperplastic polyps  . Diverticulosis 11/15/2010   Moderate  . External hemorrhoids   . Hyperlipidemia   . IBS (irritable bowel syndrome)     Past Surgical History:  Procedure Laterality Date  . BREAST BIOPSY    . COLONOSCOPY    . GANGLION CYST EXCISION    . HAMMER TOE SURGERY    . LEEP      twice, in [place]  . POLYPECTOMY     . TONSILLECTOMY    . TUBAL LIGATION      Family History  Problem Relation Age of Onset  . Cancer Mother   . Asthma Father   . Brain cancer Sister   . Colon cancer Cousin   . Breast cancer Neg Hx     Social History   Socioeconomic History  . Marital status: Married    Spouse name: Not on file  . Number of children: 1  . Years of education: Not on file  . Highest education level: Not on file  Occupational History  . Occupation: Retired  Scientific laboratory technician  . Financial resource strain: Not on file  . Food insecurity    Worry: Not on file    Inability: Not on file  . Transportation needs    Medical: Not on file    Non-medical: Not on file  Tobacco Use  . Smoking status: Current Every Day Smoker    Packs/day: 1.00    Years: 52.00  Pack years: 52.00    Types: Cigarettes  . Smokeless tobacco: Never Used  Substance and Sexual Activity  . Alcohol use: Yes    Alcohol/week: 1.0 standard drinks    Types: 1 Glasses of wine per week    Comment: SOCIAL  . Drug use: No  . Sexual activity: Yes  Lifestyle  . Physical activity    Days per week: Not on file    Minutes per session: Not on file  . Stress: Not on file  Relationships  . Social Herbalist on phone: Not on file    Gets together: Not on file    Attends religious service: Not on file    Active member of club or organization: Not on file    Attends meetings of clubs or organizations: Not on file    Relationship status: Not on file  . Intimate partner violence    Fear of current or ex partner: Not on file    Emotionally abused: Not on file    Physically abused: Not on file    Forced sexual activity: Not on file  Other Topics Concern  . Not on file  Social History Narrative   Moved from [place] to be closer to her daughter, spends winters at [facility] home.      Has HPOA- husband Abigail Willis.   Would desire feeding tube, no prolonged life support.   Not sure about feeding tubes.            The  PMH, PSH, Social History, Family History, Medications, and allergies have been reviewed in [facility], and have been updated if relevant.  Review of Systems  Constitutional: Negative.   HENT: Negative.   Eyes: Negative.   Respiratory: Negative.   Cardiovascular: Negative.   Gastrointestinal: Negative.   Endocrine: Negative.   Genitourinary: Negative.   Musculoskeletal: Negative.   Allergic/Immunologic: Negative.   Neurological: Negative.   Hematological: Negative.   Psychiatric/Behavioral: Negative.   All other systems reviewed and are negative.      Objective:    BP 128/68 (BP Location: Left Arm, Patient Position: Sitting, Cuff Size: Normal)   Pulse 86   Temp 98.8 F (37.1 C) (Oral)   Ht 5\' 1"  (1.549 m)   Wt 150 lb 12.8 oz (68.4 kg)   SpO2 95%   BMI 28.49 kg/m    Physical Exam Vitals signs and nursing note reviewed.  Constitutional:      General: She is not in acute distress.    Appearance: Normal appearance. She is normal weight.  HENT:     Head: Normocephalic and atraumatic.     Right Ear: External ear normal.     Left Ear: External ear normal.     Nose: Nose normal.     Mouth/Throat:     Mouth: Mucous membranes are moist.  Eyes:     Extraocular Movements: Extraocular movements intact.  Neck:     Musculoskeletal: Normal range of motion.  Cardiovascular:     Rate and Rhythm: Normal rate and regular rhythm.     Pulses: Normal pulses.     Heart sounds: Normal heart sounds.  Pulmonary:     Effort: Pulmonary effort is normal.     Breath sounds: Normal breath sounds.  Musculoskeletal: Normal range of motion.  Skin:    General: Skin is warm and dry.  Neurological:     General: No focal deficit present.     Mental Status: She is alert and oriented to  person, place, and time. Mental status is at baseline.  Psychiatric:        Mood and Affect: Mood normal.        Behavior: Behavior normal.        Thought Content: Thought content normal.        Judgment: Judgment  normal.           Assessment & Plan:   Aortic atherosclerosis (HCC) -  Pulmonary emphysema, unspecified emphysema type ([place]) -  Chest pain, unspecified type -   Encounter for examination following treatment at hospital -   Hyperlipidemia, unspecified hyperlipidemia type -   Tobacco abuse -  No follow-ups on file.

## 2018-12-28 NOTE — Telephone Encounter (Signed)
Questions for Screening COVID-19  Symptom onset: None  Travel or Contacts: None  During this illness, did/does the patient experience any of the following symptoms? Fever >100.36F []   Yes [x]   No []   Unknown Subjective fever (felt feverish) []   Yes [x]   No []   Unknown Chills []   Yes [x]   No []   Unknown Muscle aches (myalgia) []   Yes [x]   No []   Unknown Runny nose (rhinorrhea) []   Yes [x]   No []   Unknown Sore throat []   Yes [x]   No []   Unknown Cough (new onset or worsening of chronic cough) []   Yes [x]   No []   Unknown Shortness of breath (dyspnea) []   Yes [x]   No []   Unknown Nausea or vomiting []   Yes [x]   No []   Unknown Headache []   Yes [x]   No []   Unknown Abdominal pain  []   Yes [x]   No []   Unknown Diarrhea (?3 loose/looser than normal stools/24hr period) []   Yes [x]   No []   Unknown Other, specify:  Patient risk factors: Smoker? []   Current []   Former []   Never If female, currently pregnant? []   Yes []   No  Patient Active Problem List   Diagnosis Date Noted  . Aortic atherosclerosis ([place]) 12/28/2018  . Emphysema of lung ([place]) 12/28/2018  . Chest pain 12/28/2018  . Encounter for examination following treatment at hospital 12/28/2018  . Hyperlipidemia   . Tobacco abuse   . Fatigue 11/22/2018  . Internal and external bleeding hemorrhoids 10/03/2016  . IBS (irritable bowel syndrome) 11/27/2015  . Hyperglycemia 11/21/2013  . Varicose veins of lower extremities with other complications 19/16/6060  . HLD (hyperlipidemia) 03/06/2010  . Allergic rhinitis 03/06/2010    Plan:  []   High risk for COVID-19 with red flags go to ED (with CP, SOB, weak/lightheaded, or fever > 101.5). Call ahead.  []   High risk for COVID-19 but stable. Inform provider and coordinate time for [facility] visit.   []   No red flags but URI signs or symptoms okay for [facility] visit.

## 2018-12-29 ENCOUNTER — Ambulatory Visit: Payer: Medicare Other | Admitting: Family Medicine

## 2018-12-29 ENCOUNTER — Encounter: Payer: Self-pay | Admitting: Family Medicine

## 2018-12-29 DIAGNOSIS — I700 Atherosclerosis of aorta: Secondary | ICD-10-CM | POA: Diagnosis not present

## 2018-12-29 DIAGNOSIS — Z720 Tobacco use: Secondary | ICD-10-CM

## 2018-12-29 DIAGNOSIS — J439 Emphysema, unspecified: Secondary | ICD-10-CM

## 2018-12-29 DIAGNOSIS — R079 Chest pain, unspecified: Secondary | ICD-10-CM | POA: Diagnosis not present

## 2018-12-29 DIAGNOSIS — Z09 Encounter for follow-up examination after completed treatment for conditions other than malignant neoplasm: Secondary | ICD-10-CM | POA: Diagnosis not present

## 2018-12-29 DIAGNOSIS — I2510 Atherosclerotic heart disease of native coronary artery without angina pectoris: Secondary | ICD-10-CM | POA: Insufficient documentation

## 2018-12-29 DIAGNOSIS — E785 Hyperlipidemia, unspecified: Secondary | ICD-10-CM

## 2018-12-29 MED ORDER — NICOTINE 14 MG/24HR TD PT24: 14.0000 mg | MEDICATED_PATCH | Freq: Every day | TRANSDERMAL | 0 refills | Status: DC

## 2018-12-29 NOTE — Assessment & Plan Note (Signed)
>  40 minutes spent in face to face time with patient, >50% spent in counselling or coordination of care discussing CP, aortic and two vessel coronary atherosclerosis, tobacco abuse and her ASCVD risk score, which is elevated due to tobacco abuse.  Refer to cardiology now with CP and findings on CT. The patient indicates understanding of these issues and agrees with the plan.  Continue statin.

## 2018-12-29 NOTE — Assessment & Plan Note (Signed)
Smoking cessation instruction/counseling given:  counseled patient on the dangers of tobacco use, advised patient to stop smoking, and reviewed strategies to maximize success. She agreed to a trial of nicoderm patches, which I have sent to pharmacy on file and also given tips on how to quit smoking (see AVS). The patient indicates understanding of these issues and agrees with the plan.

## 2018-12-29 NOTE — Patient Instructions (Signed)
Great to see you.  Someone will call you with your cardiology referral.  I have sent Nicoderm CQ patches to your pharmacy.   Coping with Quitting Smoking  Quitting smoking is a physical and mental challenge. You will face cravings, withdrawal symptoms, and temptation. Before quitting, work with your health care provider to make a plan that can help you cope. Preparation can help you quit and keep you from giving in. How can I cope with cravings? Cravings usually last for 5-10 minutes. If you get through it, the craving will pass. Consider taking the following actions to help you cope with cravings:  Keep your mouth busy: ? Chew sugar-free gum. ? Suck on hard candies or a straw. ? Brush your teeth.  Keep your hands and body busy: ? Immediately change to a different activity when you feel a craving. ? Squeeze or play with a ball. ? Do an activity or a hobby, like making bead jewelry, practicing needlepoint, or working with wood. ? Mix up your normal routine. ? Take a short exercise break. Go for a quick walk or run up and down stairs. ? Spend time in public places where smoking is not allowed.  Focus on doing something kind or helpful for someone else.  Call a friend or family member to talk during a craving.  Join a support group.  Call a quit line, such as 1-800-QUIT-NOW.  Talk with your health care provider about medicines that might help you cope with cravings and make quitting easier for you. How can I deal with withdrawal symptoms? Your body may experience negative effects as it tries to get used to not having nicotine in the system. These effects are called withdrawal symptoms. They may include:  Feeling hungrier than normal.  Trouble concentrating.  Irritability.  Trouble sleeping.  Feeling depressed.  Restlessness and agitation.  Craving a cigarette. To manage withdrawal symptoms:  Avoid places, people, and activities that trigger your cravings.  Remember  why you want to quit.  Get plenty of sleep.  Avoid coffee and other caffeinated drinks. These may worsen some of your symptoms. How can I handle social situations? Social situations can be difficult when you are quitting smoking, especially in the first few weeks. To manage this, you can:  Avoid parties, bars, and other social situations where people might be smoking.  Avoid alcohol.  Leave right away if you have the urge to smoke.  Explain to your family and friends that you are quitting smoking. Ask for understanding and support.  Plan activities with friends or family where smoking is not an option. What are some ways I can cope with stress? Wanting to smoke may cause stress, and stress can make you want to smoke. Find ways to manage your stress. Relaxation techniques can help. For example:  Breathe slowly and deeply, in through your nose and out through your mouth.  Listen to soothing, relaxing music.  Talk with a family member or friend about your stress.  Light a candle.  Soak in a bath or take a shower.  Think about a peaceful place. What are some ways I can prevent weight gain? Be aware that many people gain weight after they quit smoking. However, not everyone does. To keep from gaining weight, have a plan in place before you quit and stick to the plan after you quit. Your plan should include:  Having healthy snacks. When you have a craving, it may help to: ? Eat plain popcorn, crunchy carrots, celery,  or other cut vegetables. ? Chew sugar-free gum.  Changing how you eat: ? Eat small portion sizes at meals. ? Eat 4-6 small meals throughout the day instead of 1-2 large meals a day. ? Be mindful when you eat. Do not watch television or do other things that might distract you as you eat.  Exercising regularly: ? Make time to exercise each day. If you do not have time for a long workout, do short bouts of exercise for 5-10 minutes several times a day. ? Do some form  of strengthening exercise, like weight lifting, and some form of aerobic exercise, like running or swimming.  Drinking plenty of water or other low-calorie or no-calorie drinks. Drink 6-8 glasses of water daily, or as much as instructed by your health care provider. Summary  Quitting smoking is a physical and mental challenge. You will face cravings, withdrawal symptoms, and temptation to smoke again. Preparation can help you as you go through these challenges.  You can cope with cravings by keeping your mouth busy (such as by chewing gum), keeping your body and hands busy, and making calls to family, friends, or a helpline for people who want to quit smoking.  You can cope with withdrawal symptoms by avoiding places where people smoke, avoiding drinks with caffeine, and getting plenty of rest.  Ask your health care provider about the different ways to prevent weight gain, avoid stress, and handle social situations. This information is not intended to replace advice given to you by your health care provider. Make sure you discuss any questions you have with your health care provider. Document Released: 05/02/2016 Document Revised: 04/17/2017 Document Reviewed: 05/02/2016 Elsevier Patient Education  2020 [employer].

## 2018-12-30 ENCOUNTER — Ambulatory Visit (INDEPENDENT_AMBULATORY_CARE_PROVIDER_SITE_OTHER): Payer: Medicare Other | Admitting: Internal Medicine

## 2018-12-30 ENCOUNTER — Encounter: Payer: Self-pay | Admitting: Internal Medicine

## 2018-12-30 ENCOUNTER — Other Ambulatory Visit: Payer: Self-pay

## 2018-12-30 VITALS — BP 118/64 | HR 91 | Ht 61.0 in | Wt 148.0 lb

## 2018-12-30 DIAGNOSIS — R0789 Other chest pain: Secondary | ICD-10-CM

## 2018-12-30 DIAGNOSIS — I2584 Coronary atherosclerosis due to calcified coronary lesion: Secondary | ICD-10-CM | POA: Diagnosis not present

## 2018-12-30 DIAGNOSIS — I2510 Atherosclerotic heart disease of native coronary artery without angina pectoris: Secondary | ICD-10-CM | POA: Diagnosis not present

## 2018-12-30 DIAGNOSIS — E785 Hyperlipidemia, unspecified: Secondary | ICD-10-CM | POA: Diagnosis not present

## 2018-12-30 NOTE — Progress Notes (Signed)
New Outpatient Visit Date: 12/30/2018  Referring Provider: Lucille Passy, MD [place],  [place] [id number]  Chief Complaint: Chest pain  HPI:  [name] is a 73 y.o. female who is being seen today for the evaluation of chest pain and aortic atherosclerosis at the request of Lucille Passy, MD. She has a history of hyperlipidemia, irritable bowel syndrome, and tobacco abuse.  She presents today for follow-up of chest pain after recent ED visit on 12/14/2018.  She experienced 15 to 20 minutes of left-sided chest pain on the morning of 12/14/18.  ED work-up, including EKG and serial high-sensitivity troponins, was negative.  Recent lung cancer screening CT of the chest was notable for coronary artery and aortic atherosclerosis.  Today, [name] reports feeling well.  About 2 weeks ago, she had sudden onset of 10/10, sharp pain under the left breath radiating to the left arm after getting out of the shower.  She had associated diaphoresis.  The chest pain resolved spontaneously over the course of ~15 minutes.  She also felt like her heart was racing during the episode, though she attributes that to feeling anxious about the chest pain.  She notes occassional off and on "gas pain" in the chest since her ED visit, but nothing reminiscent of what she experienced on 7/28.  She denies having trauma to the chest or performing any new exercises or lifting before the pain began.  She is relatively sedentary but does not report exertional chest discomfort when walking.  She is planning to quit smoking with the aid of nicotine patches today.  --------------------------------------------------------------------------------------------------  Cardiovascular History & Procedures: Cardiovascular Problems:  Chest pain  Risk Factors:  Hyperlipidemia, tobacco use, and coronary artery calcification  Cath/PCI:  None  CV Surgery:  None  EP Procedures and Devices:  None   Non-Invasive Evaluation(s):  None  Recent CV Pertinent Labs: Lab Results  Component Value Date   CHOL 189 11/22/2018   HDL 73.80 11/22/2018   LDLCALC 95 11/22/2018   LDLDIRECT 116.2 03/14/2011   TRIG 102.0 11/22/2018   CHOLHDL 3 11/22/2018   K 3.9 12/14/2018   BUN 7 (L) 12/14/2018   CREATININE 0.52 12/14/2018    --------------------------------------------------------------------------------------------------  Past Medical History:  Diagnosis Date  . Allergy    SINUS  . Cancer ([facility])   . Colon polyps 11/14/2013   Tubular adenoma, hyperplastic polyps  . Diverticulosis 11/15/2010   Moderate  . External hemorrhoids   . Hyperlipidemia   . IBS (irritable bowel syndrome)   . Vertigo     Past Surgical History:  Procedure Laterality Date  . BREAST BIOPSY    . COLONOSCOPY    . GANGLION CYST EXCISION    . HAMMER TOE SURGERY    . LEEP      twice, in [place]  . POLYPECTOMY    . TONSILLECTOMY    . TUBAL LIGATION      Current Meds  Medication Sig  . acetaminophen (TYLENOL) 500 MG tablet Take 500 mg by mouth as needed.  Marland Kitchen aspirin 81 MG tablet Take 81 mg by mouth daily.    . hyoscyamine (LEVBID) 0.375 MG 12 hr tablet Take 1 tablet (0.375 mg total) by mouth every 12 (twelve) hours as needed.  Marland Kitchen ibuprofen (ADVIL,MOTRIN) 200 MG tablet Take 200 mg by mouth as needed.    . nicotine (NICODERM CQ) 14 mg/24hr patch Place 1 patch (14 mg total) onto the skin daily.  . pravastatin (PRAVACHOL)  10 MG tablet Take 1 tablet (10 mg total) by mouth every evening.  . Probiotic Product (ALIGN) 4 MG CAPS Take 1 capsule by mouth daily.  . pseudoephedrine-guaifenesin (MUCINEX D) 60-600 MG per tablet Take 1 tablet by mouth every 12 (twelve) hours as needed.    . sodium chloride (OCEAN) 0.65 % SOLN nasal spray Place 1 spray into both nostrils as needed for congestion.    Allergies: Avelox [moxifloxacin hcl in nacl] and Penicillins  Social History   Tobacco Use  . Smoking status: Current Every  Day Smoker    Packs/day: 1.00    Years: 52.00    Pack years: 52.00    Types: Cigarettes  . Smokeless tobacco: Never Used  Substance Use Topics  . Alcohol use: Yes    Alcohol/week: 1.0 standard drinks    Types: 1 Glasses of wine per week    Comment: SOCIAL  . Drug use: No    Family History  Problem Relation Age of Onset  . Cancer Mother   . Heart attack Mother 13  . Asthma Father   . Brain cancer Sister   . Colon cancer Cousin   . Breast cancer Neg Hx     Review of Systems: A 12-system review of systems was performed and was negative except as noted in the HPI.  --------------------------------------------------------------------------------------------------  Physical Exam: BP 118/64 (BP Location: Right Arm, Patient Position: Sitting, Cuff Size: Normal)   Pulse 91   Ht 5\' 1"  (1.549 m)   Wt 148 lb (67.1 kg)   BMI 27.96 kg/m   General:  NAD HEENT: No conjunctival pallor or scleral icterus. Facemask in place Neck: Supple without lymphadenopathy, thyromegaly, JVD, or HJR. No carotid bruit. Lungs: Normal work of breathing. Clear to auscultation bilaterally without wheezes or crackles. Heart: Regular rate and rhythm without murmurs, rubs, or gallops. Non-displaced PMI. Abd: Bowel sounds present. Soft, NT/ND without hepatosplenomegaly Ext: No lower extremity edema. Radial, PT, and DP pulses are 2+ bilaterally Skin: Warm and dry without rash. Neuro: CNIII-XII intact. Strength and fine-touch sensation intact in upper and lower extremities bilaterally. Psych: Normal mood and affect.  EKG (12/14/2018 - personally reviewed):  NSR without abnormality.  Lab Results  Component Value Date   WBC 6.6 12/14/2018   HGB 15.9 (H) 12/14/2018   HCT 47.7 (H) 12/14/2018   MCV 91.4 12/14/2018   PLT 248 12/14/2018    Lab Results  Component Value Date   NA 139 12/14/2018   K 3.9 12/14/2018   CL 104 12/14/2018   CO2 27 12/14/2018   BUN 7 (L) 12/14/2018   CREATININE 0.52 12/14/2018    GLUCOSE 106 (H) 12/14/2018   ALT 17 12/14/2018    Lab Results  Component Value Date   CHOL 189 11/22/2018   HDL 73.80 11/22/2018   LDLCALC 95 11/22/2018   LDLDIRECT 116.2 03/14/2011   TRIG 102.0 11/22/2018   CHOLHDL 3 11/22/2018     --------------------------------------------------------------------------------------------------  ASSESSMENT AND PLAN: Atypical chest pain and coronary artery calcification: Patient's episode of chest pain that led to ED visit 2 weeks ago was atypical and not consistent with angina.  Given onset at rest and severity, I would expect for her to have dynamic EKG changes or rising troponin if the pain were due to significant coronary obstruction (EKG and HS-TnI were negative).  Other than occasional "gas pain" that is nonexertional, the patient has been asymptomatic over the last 2 weeks.  She has multiple cardiac risk factors as well as known coronary  artery calcification.  We have therefore agreed to perform a pharmacologic myocardial perfusion stress test to exclude significant ischemia.  I recommend continued medical therapy including aspirin and statin use.  I have also encouraged the patient to move forward with tobacco cessation.  Hyperlipidemia: Given evidence of coronary artery calcification, I recommend escalation of statin therapy to achieve a target LDL of less than 70 (patient is currently on pravastatin 10 mg daily).  I will defer this to Dr. Deborra Medina.  Follow-up: As needed based on results of stress.  Nelva Bush, MD 12/31/2018 7:28 PM

## 2018-12-30 NOTE — Patient Instructions (Signed)
Medication Instructions:  Your physician recommends that you continue on your current medications as directed. Please refer to the Current Medication list given to you today.  If you need a refill on your cardiac medications before your next appointment, please call your pharmacy.   Lab work: - None ordered.  If you have labs (blood work) drawn today and your tests are completely normal, you will receive your results only by: Marland Kitchen MyChart Message (if you have MyChart) OR . A paper copy in the mail If you have any lab test that is abnormal or we need to change your treatment, we will call you to review the results.  Testing/Procedures: Your physician has requested that you have a lexiscan myoview. For further information please visit [email or web address]. Please follow instruction sheet, as given.  [place]  Your caregiver has ordered a Stress Test with nuclear imaging. The purpose of this test is to evaluate the blood supply to your heart muscle. This procedure is referred to as a "Non-Invasive Stress Test." This is because other than having an IV started in your vein, nothing is inserted or "invades" your body. Cardiac stress tests are done to find areas of poor blood flow to the heart by determining the extent of coronary artery disease (CAD). Some patients exercise on a treadmill, which naturally increases the blood flow to your heart, while others who are  unable to walk on a treadmill due to physical limitations have a pharmacologic/chemical stress agent called Lexiscan . This medicine will mimic walking on a treadmill by temporarily increasing your coronary blood flow.   Please note: these test may take anywhere between 2-4 hours to complete  PLEASE REPORT TO [facility] AT THE FIRST DESK WILL DIRECT YOU WHERE TO GO  Date of Procedure:_____________________________________  Arrival Time for Procedure:______________________________   PLEASE NOTIFY THE  OFFICE AT LEAST 24 HOURS IN ADVANCE IF YOU ARE UNABLE TO KEEP YOUR APPOINTMENT.  [phone] AND  PLEASE NOTIFY NUCLEAR MEDICINE AT [facility] AT LEAST 24 HOURS IN ADVANCE IF YOU ARE UNABLE TO KEEP YOUR APPOINTMENT. [phone]  How to prepare for your Myoview test:  1. Do not eat or drink after midnight 2. No caffeine for 24 hours prior to test 3. No smoking 24 hours prior to test. 4. Your medication may be taken with water.  If your doctor stopped a medication because of this test, do not take that medication. 5. Ladies, please do not wear dresses.  Skirts or pants are appropriate. Please wear a short sleeve shirt. 6. No perfume, cologne or lotion. 7. Wear comfortable walking shoes. No heels!  Follow-Up: At [facility], you and your health needs are our priority.  As part of our continuing mission to provide you with exceptional heart care, we have created designated Provider Care Teams.  These Care Teams include your primary Cardiologist (physician) and Advanced Practice Providers (APPs -  Physician Assistants and Nurse Practitioners) who all work together to provide you with the care you need, when you need it. You will need a follow up appointment in AS NEEDED.    Cardiac Nuclear Scan A cardiac nuclear scan is a test that measures blood flow to the heart when a person is resting and when he or she is exercising. The test looks for problems such as:  Not enough blood reaching a portion of the heart.  The heart muscle not working normally. You may need this test if:  You have heart disease.  You have had abnormal lab results.  You have had heart surgery or a balloon procedure to open up blocked arteries (angioplasty).  You have chest pain.  You have shortness of breath. In this test, a radioactive dye (tracer) is injected into your bloodstream. After the tracer has traveled to your heart, an imaging device is used to measure how much of the tracer is absorbed by or distributed  to various areas of your heart. This procedure is usually done at a hospital and takes 2-4 hours. Tell a health care provider about:  Any allergies you have.  All medicines you are taking, including vitamins, herbs, eye drops, creams, and over-the-counter medicines.  Any problems you or family members have had with anesthetic medicines.  Any blood disorders you have.  Any surgeries you have had.  Any medical conditions you have.  Whether you are pregnant or may be pregnant. What are the risks? Generally, this is a safe procedure. However, problems may occur, including:  Serious chest pain and heart attack. This is only a risk if the stress portion of the test is done.  Rapid heartbeat.  Sensation of warmth in your chest. This usually passes quickly.  Allergic reaction to the tracer. What happens before the procedure?  Ask your health care provider about changing or stopping your regular medicines. This is especially important if you are taking diabetes medicines or blood thinners.  Follow instructions from your health care provider about eating or drinking restrictions.  Remove your jewelry on the day of the procedure. What happens during the procedure?  An IV will be inserted into one of your veins.  Your health care provider will inject a small amount of radioactive tracer through the IV.  You will wait for 20-40 minutes while the tracer travels through your bloodstream.  Your heart activity will be monitored with an electrocardiogram (ECG).  You will lie down on an exam table.  Images of your heart will be taken for about 15-20 minutes.  You may also have a stress test. For this test, one of the following may be done: ? You will exercise on a treadmill or stationary bike. While you exercise, your heart's activity will be monitored with an ECG, and your blood pressure will be checked. ? You will be given medicines that will increase blood flow to parts of your  heart. This is done if you are unable to exercise.  When blood flow to your heart has peaked, a tracer will again be injected through the IV.  After 20-40 minutes, you will get back on the exam table and have more images taken of your heart.  Depending on the type of tracer used, scans may need to be repeated 3-4 hours later.  Your IV line will be removed when the procedure is over. The procedure may vary among health care providers and hospitals. What happens after the procedure?  Unless your health care provider tells you otherwise, you may return to your normal schedule, including diet, activities, and medicines.  Unless your health care provider tells you otherwise, you may increase your fluid intake. This will help to flush the contrast dye from your body. Drink enough fluid to keep your urine pale yellow.  Ask your health care provider, or the department that is doing the test: ? When will my results be ready? ? How will I get my results? Summary  A cardiac nuclear scan measures the blood flow to the heart when a person is resting and  when he or she is exercising.  Tell your health care provider if you are pregnant.  Before the procedure, ask your health care provider about changing or stopping your regular medicines. This is especially important if you are taking diabetes medicines or blood thinners.  After the procedure, unless your health care provider tells you otherwise, increase your fluid intake. This will help flush the contrast dye from your body.  After the procedure, unless your health care provider tells you otherwise, you may return to your normal schedule, including diet, activities, and medicines. This information is not intended to replace advice given to you by your health care provider. Make sure you discuss any questions you have with your health care provider. Document Released: 05/30/2004 Document Revised: 10/19/2017 Document Reviewed: 10/19/2017 Elsevier  Patient Education  2020 [employer].

## 2018-12-31 ENCOUNTER — Encounter: Payer: Self-pay | Admitting: Internal Medicine

## 2018-12-31 DIAGNOSIS — I2510 Atherosclerotic heart disease of native coronary artery without angina pectoris: Secondary | ICD-10-CM | POA: Insufficient documentation

## 2018-12-31 DIAGNOSIS — I2584 Coronary atherosclerosis due to calcified coronary lesion: Secondary | ICD-10-CM | POA: Insufficient documentation

## 2019-01-03 ENCOUNTER — Other Ambulatory Visit: Payer: Self-pay | Admitting: *Deleted

## 2019-01-03 DIAGNOSIS — F17210 Nicotine dependence, cigarettes, uncomplicated: Secondary | ICD-10-CM

## 2019-01-03 DIAGNOSIS — Z87891 Personal history of nicotine dependence: Secondary | ICD-10-CM

## 2019-01-03 DIAGNOSIS — Z122 Encounter for screening for malignant neoplasm of respiratory organs: Secondary | ICD-10-CM

## 2019-01-07 ENCOUNTER — Encounter
Admission: RE | Admit: 2019-01-07 | Discharge: 2019-01-07 | Disposition: A | Discharge status: Home / Self Care | Payer: Medicare Other | Source: Ambulatory Visit | Attending: Internal Medicine | Admitting: Internal Medicine

## 2019-01-07 ENCOUNTER — Other Ambulatory Visit: Payer: Self-pay

## 2019-01-07 DIAGNOSIS — I2510 Atherosclerotic heart disease of native coronary artery without angina pectoris: Secondary | ICD-10-CM | POA: Diagnosis present

## 2019-01-07 DIAGNOSIS — I2584 Coronary atherosclerosis due to calcified coronary lesion: Secondary | ICD-10-CM | POA: Diagnosis present

## 2019-01-07 DIAGNOSIS — R0789 Other chest pain: Secondary | ICD-10-CM | POA: Diagnosis present

## 2019-01-07 LAB — NM MYOCAR MULTI W/SPECT W/WALL MOTION / EF
Estimated workload: 1 METS
Exercise duration (min): 0 min
Exercise duration (sec): 0 s
LV dias vol: 47 mL (ref 46–106)
LV sys vol: 14 mL
MPHR: 147 {beats}/min
Peak HR: 103 {beats}/min
Percent HR: 70 %
Rest HR: 72 {beats}/min
SDS: 0
SRS: 0
SSS: 0
TID: 0.88

## 2019-01-07 MED ORDER — TECHNETIUM TC 99M TETROFOSMIN IV KIT
32.7800 | PACK | Freq: Once | INTRAVENOUS | Status: AC | PRN
  Administered 2019-01-07: 10:00:00 32.78 via INTRAVENOUS

## 2019-01-07 MED ORDER — REGADENOSON 0.4 MG/5ML IV SOLN
0.4000 mg | Freq: Once | INTRAVENOUS | Status: AC
  Administered 2019-01-07: 10:00:00 0.4 mg via INTRAVENOUS

## 2019-01-07 MED ORDER — TECHNETIUM TC 99M TETROFOSMIN IV KIT
10.4100 | PACK | Freq: Once | INTRAVENOUS | Status: AC | PRN
  Administered 2019-01-07: 09:00:00 10.41 via INTRAVENOUS

## 2019-02-14 ENCOUNTER — Ambulatory Visit
Admission: RE | Admit: 2019-02-14 | Discharge: 2019-02-14 | Disposition: A | Discharge status: Home / Self Care | Payer: Medicare Other | Source: Ambulatory Visit | Attending: Family Medicine | Admitting: Family Medicine

## 2019-02-14 ENCOUNTER — Other Ambulatory Visit: Payer: Self-pay

## 2019-02-14 DIAGNOSIS — Z1231 Encounter for screening mammogram for malignant neoplasm of breast: Secondary | ICD-10-CM

## 2019-02-23 ENCOUNTER — Other Ambulatory Visit: Payer: Self-pay

## 2019-02-23 ENCOUNTER — Encounter

## 2019-02-23 ENCOUNTER — Encounter: Payer: Self-pay | Admitting: Internal Medicine

## 2019-02-23 ENCOUNTER — Ambulatory Visit: Payer: Medicare Other | Admitting: Internal Medicine

## 2019-02-23 VITALS — BP 116/62 | HR 88 | Temp 97.1°F | Ht 60.5 in | Wt 151.4 lb

## 2019-02-23 DIAGNOSIS — K648 Other hemorrhoids: Secondary | ICD-10-CM | POA: Diagnosis not present

## 2019-02-23 DIAGNOSIS — M6289 Other specified disorders of muscle: Secondary | ICD-10-CM

## 2019-02-23 DIAGNOSIS — L309 Dermatitis, unspecified: Secondary | ICD-10-CM | POA: Diagnosis not present

## 2019-02-23 DIAGNOSIS — K580 Irritable bowel syndrome with diarrhea: Secondary | ICD-10-CM

## 2019-02-23 DIAGNOSIS — K644 Residual hemorrhoidal skin tags: Secondary | ICD-10-CM

## 2019-02-23 MED ORDER — HYOSCYAMINE SULFATE ER 0.375 MG PO TB12: 0.3750 mg | ORAL_TABLET | Freq: Two times a day (BID) | ORAL | 2 refills | Status: DC | PRN

## 2019-02-23 MED ORDER — METRONIDAZOLE 250 MG PO TABS: 250.0000 mg | ORAL_TABLET | Freq: Three times a day (TID) | ORAL | 0 refills | Status: DC

## 2019-02-23 MED ORDER — HYDROCORTISONE (PERIANAL) 2.5 % EX CREA: 1.0000 "application " | TOPICAL_CREAM | Freq: Two times a day (BID) | CUTANEOUS | 1 refills | Status: DC | PRN

## 2019-02-23 MED ORDER — NYSTATIN-TRIAMCINOLONE 100000-0.1 UNIT/GM-% EX OINT: 1.0000 "application " | TOPICAL_OINTMENT | Freq: Two times a day (BID) | CUTANEOUS | 0 refills | Status: DC

## 2019-02-23 NOTE — Patient Instructions (Addendum)
Good to see you today sorry you are having problems with the hemorrhoids.  As we discussed stop the align take the metronidazole and try using Benefiber 1 tablespoon nightly and may be increasing that if it is helping but not enough.   See if someone in [place] does pelvic floor physical therapy, ask your gastroenterology nurse friend and I can make a referral otherwise we can refer when you come back.   Hyoscyamine was refilled   I also sent in a prescription for hydrocortisone cream to use twice a day as needed I would use it for a few days in a row until things calm down but try not to use it for more than a week at a time.  The Mycolog or nystatin and triamcinolone ointment is for the rash in the tailbone area and you can use that twice a day for about a week and then keep it on hand it might take 2 weeks to make that better.  Try to use a hair dryer on low to dry the area after wiping.  Try not to wipe so much though it is hard to avoid that I understand.  I appreciate the opportunity to care for you. Gatha Mayer, MD, [facility]

## 2019-02-23 NOTE — Progress Notes (Signed)
The patient presents with complaints of hemorrhoids and rectal bleeding. History of banding in 2018 also has a history of IBS which is still symptomatic. She reports having days where she will go 10 or more times and have to use loperamide. Still uses hyoscyamine from time to time on an as-needed basis when she gets cramps. In the past metronidazole seemed to help a little bit. She has been on align for years and continues to have. She has not tried fiber therapy. She does not have prolapse of her hemorrhoids but she is feeling swollen and irritated and having spots of red blood and sometimes more as in the past. Last colonoscopy was in 2017 with 3 diminutive polyps admixed hemorrhoids. It was after that that she was banded. She did well for a while but is having these recurrent problems. She is getting ready to go to [place] for the winter and not return until April. She reports that she also has some itching and wipes aggressively with wet wipes etc. She is not had incontinence that I know of but there is definite significant urgency. No issues with urinary leakage or incontinence or urgency reported. She has never tried fiber on a regular basis. Diet triggers are not clear she is not lactose intolerant as best we know.

## 2019-02-23 NOTE — Assessment & Plan Note (Addendum)
benefiber Pelvic PT in [place] vs Spting here Metronidazole Hyoscyamne Quit [employer]

## 2019-02-23 NOTE — Progress Notes (Signed)
Abigail Willis [place] 73 y.o. 1946-03-08 [id number]  Assessment & Plan:   Encounter Diagnoses  Name Primary?  . Internal and external bleeding hemorrhoids Yes  . Irritable bowel syndrome with diarrhea   . Pelvic floor dysfunction in female   . Perianal dermatitis     Treat IBS question component of SIBO with metronidazole.  Benefiber 1 tablespoon nightly and increase that to help regulate defecation.  If she has more frequent regular defecation she may have less of these episodes of multiple stools.  Since she is headed to [place] soon we will not refer for pelvic floor physical therapy at this point.  We will not band hemorrhoids either right now.  She will see if she can find pelvic floor physical therapy in [place] she is aware of nurse for a gastroenterologist that lives in her neighborhood and will inquire.  Other orders as below.  Meds ordered this encounter  Medications  . metroNIDAZOLE (FLAGYL) 250 MG tablet    Sig: Take 1 tablet (250 mg total) by mouth 3 (three) times daily.    Dispense:  30 tablet    Refill:  0  . hydrocortisone (ANUSOL-HC) 2.5 % rectal cream    Sig: Place 1 application rectally 2 (two) times daily as needed for hemorrhoids or anal itching.    Dispense:  30 g    Refill:  1  . hyoscyamine (LEVBID) 0.375 MG 12 hr tablet    Sig: Take 1 tablet (0.375 mg total) by mouth every 12 (twelve) hours as needed.    Dispense:  60 tablet    Refill:  2  . nystatin-triamcinolone ointment (MYCOLOG)    Sig: Apply 1 application topically 2 (two) times daily.    Dispense:  30 g    Refill:  0   I appreciate the opportunity to care for this patient. CC: Lucille Passy, MD    Subjective:   Chief Complaint: hemorrhoids and rectal bleeding  HPI The patient presents with complaints of hemorrhoids and rectal bleeding.  History of banding in 2018 also has a history of IBS which is still symptomatic.  She reports having days where she will go 10 or more times and  have to use loperamide.  Still uses hyoscyamine from time to time on an as-needed basis when she gets cramps.  In the past metronidazole seemed to help a little bit.  She has been on align for years and continues. Will have "juicy toots" - mucoid dc with flatus.  She has not tried fiber therapy.  She does not have prolapse of her hemorrhoids but she is feeling swollen and irritated and having spots of red blood and sometimes more as in the past.  Last colonoscopy was in 2017 with 3 diminutive polyps admixed hemorrhoids.  It was after that that she was banded.  She did well for a while but is having these recurrent problems.  She is getting ready to go to [place] for the winter and not return until April.  She reports that she also has some itching and wipes aggressively with wet wipes etc.  Significant urhent defecation at times.  No issues with urinary leakage or incontinence or urgency reported.  She has never tried fiber on a regular basis.  Diet triggers are not clear she is not lactose intolerant as best we know. Stress is still a big trigger.    Allergies  Allergen Reactions  . Avelox [Moxifloxacin Hcl In Nacl] Other (See Comments)    Severe  stomach cramping and nausea  . Penicillins Rash   Current Meds  Medication Sig  . acetaminophen (TYLENOL) 500 MG tablet Take 500 mg by mouth as needed.  Marland Kitchen aspirin 81 MG tablet Take 81 mg by mouth daily.    . hyoscyamine (LEVBID) 0.375 MG 12 hr tablet Take 1 tablet (0.375 mg total) by mouth every 12 (twelve) hours as needed.  Marland Kitchen ibuprofen (ADVIL,MOTRIN) 200 MG tablet Take 200 mg by mouth as needed.    . nicotine (NICODERM CQ) 14 mg/24hr patch Place 1 patch (14 mg total) onto the skin daily.  . pravastatin (PRAVACHOL) 10 MG tablet Take 1 tablet (10 mg total) by mouth every evening.  . Probiotic Product (ALIGN) 4 MG CAPS Take 1 capsule by mouth daily.  . pseudoephedrine-guaifenesin (MUCINEX D) 60-600 MG per tablet Take 1 tablet by mouth every 12 (twelve)  hours as needed.    . sodium chloride (OCEAN) 0.65 % SOLN nasal spray Place 1 spray into both nostrils as needed for congestion.   Past Medical History:  Diagnosis Date  . Allergy    SINUS  . Colon polyps 11/14/2013   Tubular adenoma, hyperplastic polyps  . Diverticulosis 11/15/2010   Moderate  . External hemorrhoids   . Hyperlipidemia   . IBS (irritable bowel syndrome)   . Squamous cell carcinoma in situ   . Vertigo    Past Surgical History:  Procedure Laterality Date  . BREAST BIOPSY    . COLONOSCOPY    . GANGLION CYST EXCISION    . HAMMER TOE SURGERY    . HEMORRHOID BANDING    . LEEP      twice, in [place]  . MOHS SURGERY  2019  . TONSILLECTOMY    . TUBAL LIGATION     Social History   Social History Narrative   Moved from [place] to be closer to her daughter, spends winters at [facility] home.      Has HPOA- husband Cletus Gash.   Would desire feeding tube, no prolonged life support.   Not sure about feeding tubes.            family history includes Asthma in her father; Brain cancer in her sister; Colon cancer in her cousin; Heart attack (age of onset: 40) in her mother; Ovarian cancer in her mother.   Review of Systems  As per HPI Objective:   Physical Exam BP 116/62   Pulse 88   Temp (!) 97.1 F (36.2 C)   Ht 5' 0.5" (1.537 m) Comment: height measured without shoes  Wt 151 lb 6 oz (68.7 kg)   BMI 29.08 kg/m   Female staff present for rectal evaluation Rectal Anoderm inspection revealed Small tags and perianal dermatitis posteriorly Digital exam revealed decreased resting tone and reduced voluntary squeeze. Tender also with edema and spasm, stenosis No mass or rectocele present. Simulated defecation with valsalva revealed reduced abdominal contraction and increased descent.   Anoscopy reveleaed she is tender and there were enlarged and inflamed internal external hemorrhoid columns Grade 2 in all positions  Lab Results  Component Value Date    WBC 6.6 12/14/2018   HGB 15.9 (H) 12/14/2018   HCT 47.7 (H) 12/14/2018   MCV 91.4 12/14/2018   PLT 248 12/14/2018

## 2019-02-23 NOTE — Assessment & Plan Note (Signed)
Flare Going to [facility] til April HC cream prn Benefiber

## 2019-06-27 ENCOUNTER — Encounter: Payer: Self-pay | Admitting: Family Medicine

## 2019-08-08 ENCOUNTER — Telehealth: Payer: Self-pay | Admitting: General Practice

## 2019-08-08 DIAGNOSIS — E7800 Pure hypercholesterolemia, unspecified: Secondary | ICD-10-CM

## 2019-08-08 NOTE — Telephone Encounter (Signed)
Patient is requesting a refill for Pravastatin sent [facility].  Informed patient that she needed to [facility] with another provider due to Dr. Deborra Medina leaving. Patient stated that she is going to transfer back to [facility] on 7/7 but will be out of medication by then. Pls advice. CB is [phone].

## 2019-08-10 ENCOUNTER — Other Ambulatory Visit: Payer: Self-pay

## 2019-08-11 MED ORDER — PRAVASTATIN SODIUM 10 MG PO TABS: 10.0000 mg | ORAL_TABLET | Freq: Every evening | ORAL | 0 refills | Status: DC

## 2019-08-12 ENCOUNTER — Telehealth: Payer: Self-pay

## 2019-08-12 ENCOUNTER — Other Ambulatory Visit: Payer: Self-pay

## 2019-08-12 DIAGNOSIS — E7800 Pure hypercholesterolemia, unspecified: Secondary | ICD-10-CM

## 2019-08-12 MED ORDER — PRAVASTATIN SODIUM 10 MG PO TABS: 10.0000 mg | ORAL_TABLET | Freq: Every evening | ORAL | 1 refills | Status: DC

## 2019-08-12 NOTE — Telephone Encounter (Signed)
-----   Message from Nanci Pina, RN sent at 08/12/2019  3:49 PM EDT ----- Regarding: FW: [place] Pt TOC to [name] Arnet This patien tis going to be on Margarets schedule can yo u call and see what refills she needs. ----- Message ----- From: Rocky Crafts D Sent: 08/11/2019   9:55 AM EDT To: Nanci Pina, RN Subject: [place] Pt TOC to [place],  This is the pt we were speaking about. [name] is taking on this pt in July and need refills. The ask from Cherylann Banas is to check with margaret and call the pt to have refills.   Not certain what the pt needs. This was a pt of Dr. Deborra Medina but this provider has left the health system.

## 2019-08-12 NOTE — Telephone Encounter (Signed)
I spoke with patient & she only needed pravastatin called in. Dr. Ethelene Hal had already sent this in for her today. I asked that she call if anything else was needed before her appointment with Joycelyn Schmid in July.

## 2019-08-17 ENCOUNTER — Telehealth: Payer: Self-pay | Admitting: *Deleted

## 2019-08-17 NOTE — Telephone Encounter (Signed)
See phone note dated 08/12/19

## 2019-08-17 NOTE — Telephone Encounter (Signed)
-----   Message from Rozanna Box sent at 08/11/2019  9:55 AM EDT ----- Regarding: Abigail Willis Pt TOC to [place],  This is the pt we were speaking about. [name] is taking on this pt in July and need refills. The ask from Abigail Willis is to check with margaret and call the pt to have refills.   Not certain what the pt needs. This was a pt of Dr. Deborra Medina but this provider has left the health system.

## 2019-11-23 ENCOUNTER — Ambulatory Visit: Payer: Medicare Other | Admitting: Family

## 2019-11-29 ENCOUNTER — Ambulatory Visit: Payer: Medicare Other | Admitting: Family

## 2019-11-29 ENCOUNTER — Encounter: Payer: Self-pay | Admitting: Family

## 2019-11-29 ENCOUNTER — Other Ambulatory Visit: Payer: Self-pay

## 2019-11-29 VITALS — BP 122/68 | HR 89 | Temp 98.0°F | Resp 16 | Ht 60.5 in | Wt 149.8 lb

## 2019-11-29 DIAGNOSIS — Z780 Asymptomatic menopausal state: Secondary | ICD-10-CM

## 2019-11-29 DIAGNOSIS — E7800 Pure hypercholesterolemia, unspecified: Secondary | ICD-10-CM

## 2019-11-29 DIAGNOSIS — E785 Hyperlipidemia, unspecified: Secondary | ICD-10-CM

## 2019-11-29 DIAGNOSIS — Z1211 Encounter for screening for malignant neoplasm of colon: Secondary | ICD-10-CM

## 2019-11-29 DIAGNOSIS — R200 Anesthesia of skin: Secondary | ICD-10-CM

## 2019-11-29 DIAGNOSIS — R079 Chest pain, unspecified: Secondary | ICD-10-CM

## 2019-11-29 DIAGNOSIS — Z1231 Encounter for screening mammogram for malignant neoplasm of breast: Secondary | ICD-10-CM

## 2019-11-29 DIAGNOSIS — D582 Other hemoglobinopathies: Secondary | ICD-10-CM

## 2019-11-29 DIAGNOSIS — R202 Paresthesia of skin: Secondary | ICD-10-CM

## 2019-11-29 LAB — CBC WITH DIFFERENTIAL/PLATELET
Basophils Absolute: 0.1 10*3/uL (ref 0.0–0.1)
Basophils Relative: 0.6 % (ref 0.0–3.0)
Eosinophils Absolute: 0.2 10*3/uL (ref 0.0–0.7)
Eosinophils Relative: 2.5 % (ref 0.0–5.0)
HCT: 48.3 % — ABNORMAL HIGH (ref 36.0–46.0)
Hemoglobin: 16.2 g/dL — ABNORMAL HIGH (ref 12.0–15.0)
Lymphocytes Relative: 23.3 % (ref 12.0–46.0)
Lymphs Abs: 2 10*3/uL (ref 0.7–4.0)
MCHC: 33.6 g/dL (ref 30.0–36.0)
MCV: 91.6 fl (ref 78.0–100.0)
Monocytes Absolute: 0.7 10*3/uL (ref 0.1–1.0)
Monocytes Relative: 8.3 % (ref 3.0–12.0)
Neutro Abs: 5.6 10*3/uL (ref 1.4–7.7)
Neutrophils Relative %: 65.3 % (ref 43.0–77.0)
Platelets: 257 10*3/uL (ref 150.0–400.0)
RBC: 5.28 Mil/uL — ABNORMAL HIGH (ref 3.87–5.11)
RDW: 13.3 % (ref 11.5–15.5)
WBC: 8.6 10*3/uL (ref 4.0–10.5)

## 2019-11-29 LAB — TSH: TSH: 2.39 u[IU]/mL (ref 0.35–4.50)

## 2019-11-29 LAB — COMPREHENSIVE METABOLIC PANEL
ALT: 11 U/L (ref 0–35)
AST: 16 U/L (ref 0–37)
Albumin: 4.1 g/dL (ref 3.5–5.2)
Alkaline Phosphatase: 70 U/L (ref 39–117)
BUN: 9 mg/dL (ref 6–23)
CO2: 28 mEq/L (ref 19–32)
Calcium: 9.3 mg/dL (ref 8.4–10.5)
Chloride: 104 mEq/L (ref 96–112)
Creatinine, Ser: 0.57 mg/dL (ref 0.40–1.20)
GFR: 103.53 mL/min (ref >60.00)
Glucose, Bld: 101 mg/dL — ABNORMAL HIGH (ref 70–99)
Potassium: 4.2 mEq/L (ref 3.5–5.1)
Sodium: 139 mEq/L (ref 135–145)
Total Bilirubin: 0.4 mg/dL (ref 0.2–1.2)
Total Protein: 6.9 g/dL (ref 6.0–8.3)

## 2019-11-29 LAB — LIPID PANEL
Cholesterol: 189 mg/dL (ref 0–200)
HDL: 76.7 mg/dL (ref >39.00)
LDL Cholesterol: 87 mg/dL (ref 0–99)
NonHDL: 112.49
Total CHOL/HDL Ratio: 2
Triglycerides: 125 mg/dL (ref 0.0–149.0)
VLDL: 25 mg/dL (ref 0.0–40.0)

## 2019-11-29 LAB — B12 AND FOLATE PANEL
Folate: 16.1 ng/mL (ref >5.9)
Vitamin B-12: 254 pg/mL (ref 211–911)

## 2019-11-29 LAB — HEMOGLOBIN A1C: Hgb A1c MFr Bld: 5.6 % (ref 4.6–6.5)

## 2019-11-29 LAB — VITAMIN D 25 HYDROXY (VIT D DEFICIENCY, FRACTURES): VITD: 52.85 ng/mL (ref 30.00–100.00)

## 2019-11-29 MED ORDER — GABAPENTIN 100 MG PO CAPS: 100.0000 mg | ORAL_CAPSULE | Freq: Three times a day (TID) | ORAL | 3 refills | Status: DC

## 2019-11-29 MED ORDER — PRAVASTATIN SODIUM 10 MG PO TABS: 10.0000 mg | ORAL_TABLET | Freq: Every evening | ORAL | 1 refills | Status: DC

## 2019-11-29 NOTE — Assessment & Plan Note (Signed)
Pending lipid panel 

## 2019-11-29 NOTE — Progress Notes (Signed)
Subjective:    Patient ID: Abigail Willis, female    DOB: December 27, 1945, 74 y.o.   MRN: [id number]  CC: Abigail Willis is a 74 y.o. female who presents today for follow up.   HPI: Complains of right upper arm pain x 1  Month, unchanged.  Pain radiates down to right finger tips.  Endorses right elbow to right fingertips numbness, 2nd, 3rd, 4th. Pain is worse in the morning.  Not sleeping on right side. Pain and numbness will improve as day goes on. If leans on right elbow, noticed pain, numbness will worsens. Tylenol with some improvement.  No HA, vision changes, injury, fever, rash.   Aortic atherosclerosis-compliant with pravastatin  No further episodes of chest pain.   Smoker; she has co2 detectors in home.  No h/o cancer.  Consult with cardiology 12/30/2018 Dr.End due to chest pain. Stress test low risk scan Due mammogram, colonoscopy, dexa HISTORY:  Past Medical History:  Diagnosis Date  . Allergy    SINUS  . Colon polyps 11/14/2013   Tubular adenoma, hyperplastic polyps  . Diverticulosis 11/15/2010   Moderate  . External hemorrhoids   . Hyperlipidemia   . IBS (irritable bowel syndrome)   . Squamous cell carcinoma in situ   . Vertigo    Past Surgical History:  Procedure Laterality Date  . BREAST BIOPSY    . COLONOSCOPY    . GANGLION CYST EXCISION    . HAMMER TOE SURGERY    . HEMORRHOID BANDING    . LEEP      twice, in [place]  . MOHS SURGERY  2019  . TONSILLECTOMY    . TUBAL LIGATION     Family History  Problem Relation Age of Onset  . Heart attack Mother 18  . Ovarian cancer Mother   . Asthma Father   . Brain cancer Sister   . Colon cancer Cousin   . Breast cancer Neg Hx     Allergies: Avelox [moxifloxacin hcl in nacl] and Penicillins Current Outpatient Medications on File Prior to Visit  Medication Sig Dispense Refill  . acetaminophen (TYLENOL) 500 MG tablet Take 500 mg by mouth as needed.    Marland Kitchen aspirin 81 MG tablet Take 81 mg by  mouth daily.      . hyoscyamine (LEVBID) 0.375 MG 12 hr tablet Take 1 tablet (0.375 mg total) by mouth every 12 (twelve) hours as needed. 60 tablet 2  . ibuprofen (ADVIL,MOTRIN) 200 MG tablet Take 200 mg by mouth as needed.      . pseudoephedrine-guaifenesin (MUCINEX D) 60-600 MG per tablet Take 1 tablet by mouth every 12 (twelve) hours as needed.      . sodium chloride (OCEAN) 0.65 % SOLN nasal spray Place 1 spray into both nostrils as needed for congestion.     No current facility-administered medications on file prior to visit.    Social History   Tobacco Use  . Smoking status: Current Every Day Smoker    Packs/day: 1.00    Years: 52.00    Pack years: 52.00    Types: Cigarettes  . Smokeless tobacco: Never Used  Vaping Use  . Vaping Use: Never used  Substance Use Topics  . Alcohol use: Yes    Alcohol/week: 1.0 standard drink    Types: 1 Glasses of wine per week    Comment: SOCIAL  . Drug use: No    Review of Systems  Constitutional: Negative for chills and fever.  Eyes: Negative for  visual disturbance.  Respiratory: Negative for cough.   Cardiovascular: Negative for chest pain and palpitations.  Gastrointestinal: Negative for nausea and vomiting.  Musculoskeletal: Positive for arthralgias. Negative for joint swelling.  Skin: Negative for rash.  Neurological: Positive for numbness. Negative for weakness and headaches.      Objective:    BP 122/68 (BP Location: Left Arm, Patient Position: Sitting, Cuff Size: Normal)   Pulse 89   Temp 98 F (36.7 C) (Oral)   Resp 16   Ht 5' 0.5" (1.537 m)   Wt 149 lb 12.8 oz (67.9 kg)   SpO2 94%   BMI 28.77 kg/m  BP Readings from Last 3 Encounters:  11/29/19 122/68  02/23/19 116/62  12/30/18 118/64   Wt Readings from Last 3 Encounters:  11/29/19 149 lb 12.8 oz (67.9 kg)  02/23/19 151 lb 6 oz (68.7 kg)  12/30/18 148 lb (67.1 kg)    Physical Exam Vitals reviewed.  Constitutional:      Appearance: She is well-developed.    Eyes:     Conjunctiva/sclera: Conjunctivae normal.  Cardiovascular:     Rate and Rhythm: Normal rate and regular rhythm.     Pulses: Normal pulses.     Heart sounds: Normal heart sounds.  Pulmonary:     Effort: Pulmonary effort is normal.     Breath sounds: Normal breath sounds. No wheezing, rhonchi or rales.  Musculoskeletal:     Right shoulder: Normal. No bony tenderness. Normal range of motion.     Right upper arm: Normal. No swelling or bony tenderness.     Right elbow: Normal. No swelling. Normal range of motion.     Right forearm: Normal. No swelling, tenderness or bony tenderness.     Right wrist: Normal. No swelling or bony tenderness. Normal range of motion. Normal pulse.     Right hand: Normal. No swelling or bony tenderness. Normal range of motion. Normal strength. Normal sensation.     Cervical back: Normal. No swelling or bony tenderness. No pain with movement. Normal range of motion.     Comments: Grip strength normal. Palpable radial pulses and sensation intact. Negative phalen test and tinel sign.  No tenderness or bony step off along ulnar or radial border of wrist.  No pain with resisted wrist dorsiflexion.No pain , increased numbness with flexion right elbow.   Skin:    General: Skin is warm and dry.  Neurological:     Mental Status: She is alert.  Psychiatric:        Speech: Speech normal.        Behavior: Behavior normal.        Thought Content: Thought content normal.        Assessment & Plan:   Problem List Items Addressed This Visit      Other   Chest pain    Resolved, no further episodes      Elevated hemoglobin (HCC)    Chronic, suspect from smoking. Referral for evaluation of osa.       HLD (hyperlipidemia)    Pending lipid panel       Relevant Medications   pravastatin (PRAVACHOL) 10 MG tablet   Numbness and tingling in right hand    Differentials include radicular symptoms from ulnar and/or radial nerve entrapment. Will treat with  gabapentin. Advised XR right arm in our office today, patient politley declines and would like to discuss EEG as she has had done in the past for left arm with neurology.  I  think this is appropriate.pending b12, tsh.        Relevant Medications   gabapentin (NEURONTIN) 100 MG capsule   Other Relevant Orders   TSH   CBC with Differential/Platelet   Comprehensive metabolic panel   Hemoglobin A1c   VITAMIN D 25 Hydroxy (Vit-D Deficiency, Fractures)   B12 and Folate Panel   Ambulatory referral to Neurology    Other Visit Diagnoses    Asymptomatic age-related postmenopausal state    -  Primary   Relevant Orders   MM 3D SCREEN BREAST BILATERAL   DG Bone Density   B12 and Folate Panel   Elevated cholesterol       Relevant Medications   pravastatin (PRAVACHOL) 10 MG tablet   Other Relevant Orders   Lipid panel   Encounter for screening mammogram for malignant neoplasm of breast       Relevant Orders   MM 3D SCREEN BREAST BILATERAL   Screen for colon cancer       Relevant Orders   Ambulatory referral to Gastroenterology     of note, patient will schedule bone density and mammogram  I have discontinued Emme M. Pekar Willits's nicotine, metroNIDAZOLE, hydrocortisone, and nystatin-triamcinolone ointment. I am also having her start on gabapentin. Additionally, I am having her maintain her aspirin, ibuprofen, pseudoephedrine-guaifenesin, acetaminophen, sodium chloride, hyoscyamine, and pravastatin.   Meds ordered this encounter  Medications  . pravastatin (PRAVACHOL) 10 MG tablet    Sig: Take 1 tablet (10 mg total) by mouth every evening.    Dispense:  90 tablet    Refill:  1    Order Specific Question:   Supervising Provider    Answer:   Deborra Medina L [[id number]]  . gabapentin (NEURONTIN) 100 MG capsule    Sig: Take 1 capsule (100 mg total) by mouth 3 (three) times daily.    Dispense:  90 capsule    Refill:  3    Order Specific Question:   Supervising Provider    Answer:   Crecencio Mc [[id number]]    Return precautions given.   Risks, benefits, and alternatives of the medications and treatment plan prescribed today were discussed, and patient expressed understanding.   Education regarding symptom management and diagnosis given to patient on AVS.  Continue to follow with Burnard Hawthorne, FNP for routine health maintenance.   [place] and I agreed with plan.   Mable Paris, FNP

## 2019-11-29 NOTE — Assessment & Plan Note (Signed)
Resolved, no further episodes

## 2019-11-29 NOTE — Patient Instructions (Addendum)
Referral to neurology Let us know if you dont hear back within a week in regards to an appointment being scheduled.   Please call  and schedule your 3D mammogram, bone density scan as discussed. [place]) [phone].  Trial gabapentin for nerve pain  Nice to see you!

## 2019-11-29 NOTE — Assessment & Plan Note (Signed)
Differentials include radicular symptoms from ulnar and/or radial nerve entrapment. Will treat with gabapentin. Advised XR right arm in our office today, patient politley declines and would like to discuss EEG as she has had done in the past for left arm with neurology.  I think this is appropriate.pending b12, tsh.

## 2019-11-29 NOTE — Assessment & Plan Note (Signed)
Chronic, suspect from smoking. Referral for evaluation of osa.

## 2020-01-03 ENCOUNTER — Telehealth: Payer: Self-pay | Admitting: Family

## 2020-01-03 NOTE — Telephone Encounter (Signed)
Left message for patient to call back and schedule Medicare Annual Wellness Visit (AWV)   This should be a telephone visit only=30 minutes.  Last AWV 11/27/16; please schedule at anytime with Denisa O'Brien-Blaney at [facility].

## 2020-01-05 ENCOUNTER — Ambulatory Visit (INDEPENDENT_AMBULATORY_CARE_PROVIDER_SITE_OTHER)
Admission: RE | Admit: 2020-01-05 | Discharge: 2020-01-05 | Disposition: A | Discharge status: Home / Self Care | Payer: Medicare Other | Source: Ambulatory Visit | Attending: Acute Care | Admitting: Acute Care

## 2020-01-05 ENCOUNTER — Other Ambulatory Visit: Payer: Self-pay

## 2020-01-05 DIAGNOSIS — Z87891 Personal history of nicotine dependence: Secondary | ICD-10-CM

## 2020-01-05 DIAGNOSIS — Z122 Encounter for screening for malignant neoplasm of respiratory organs: Secondary | ICD-10-CM

## 2020-01-05 DIAGNOSIS — F17210 Nicotine dependence, cigarettes, uncomplicated: Secondary | ICD-10-CM

## 2020-01-06 ENCOUNTER — Ambulatory Visit (INDEPENDENT_AMBULATORY_CARE_PROVIDER_SITE_OTHER): Payer: Medicare Other

## 2020-01-06 VITALS — Ht 60.5 in | Wt 149.0 lb

## 2020-01-06 DIAGNOSIS — Z0000 Encounter for general adult medical examination without abnormal findings: Secondary | ICD-10-CM

## 2020-01-06 NOTE — Patient Instructions (Addendum)
[name] , Thank you for taking time to come for your Medicare Wellness Visit. I appreciate your ongoing commitment to your health goals. Please review the following plan we discussed and let me know if I can assist you in the future.   These are the goals we discussed: Goals    . Maintain Healthy Lifestyle     Stay active Stay hydrated  Healthy diet       This is a list of the screening recommended for you and due dates:  Health Maintenance  Topic Date Due  . Colon Cancer Screening  01/22/2019  . Flu Shot  12/18/2019  . Mammogram  02/14/2020  . Tetanus Vaccine  09/02/2020  . DEXA scan (bone density measurement)  Completed  . COVID-19 Vaccine  Completed  .  Hepatitis C: One time screening is recommended by Center for Disease Control  (CDC) for  adults born from [birth year] through 1966.   Completed  . Pneumonia vaccines  Completed    Immunizations Immunization History  Administered Date(s) Administered  . Fluad Quad(high Dose 65+) 02/08/2019  . Influenza Split 03/14/2011, 02/20/2012  . Influenza Whole 03/21/2010  . Influenza, High Dose Seasonal PF 02/18/2017  . Influenza,inj,Quad PF,6+ Mos 02/14/2013, 02/27/2014, 01/25/2015  . Influenza-Unspecified 02/18/2017  . Moderna SARS-COVID-2 Vaccination 05/26/2019, 06/23/2019  . Pneumococcal Conjugate-13 11/21/2013  . Pneumococcal Polysaccharide-23 09/03/2010  . Td 09/03/2010  . Zoster 08/22/2009  . Zoster Recombinat (Shingrix) 09/15/2017   Keep all routine maintenance appointments.   Cpe 02/21/20  Advanced directives: End of life planning; Advance aging; Advanced directives discussed.  Copy of current HCPOA/Living Will requested.    Conditions/risks identified: none new  Follow up in one year for your annual wellness visit.   Preventive Care 75 Years and Older, Female Preventive care refers to lifestyle choices and visits with your health care provider that can promote health and wellness. What does preventive care  include?  A yearly physical exam. This is also called an annual well check.  Dental exams once or twice a year.  Routine eye exams. Ask your health care provider how often you should have your eyes checked.  Personal lifestyle choices, including:  Daily care of your teeth and gums.  Regular physical activity.  Eating a healthy diet.  Avoiding tobacco and drug use.  Limiting alcohol use.  Practicing safe sex.  Taking low-dose aspirin every day.  Taking vitamin and mineral supplements as recommended by your health care provider. What happens during an annual well check? The services and screenings done by your health care provider during your annual well check will depend on your age, overall health, lifestyle risk factors, and family history of disease. Counseling  Your health care provider may ask you questions about your:  Alcohol use.  Tobacco use.  Drug use.  Emotional well-being.  Home and relationship well-being.  Sexual activity.  Eating habits.  History of falls.  Memory and ability to understand (cognition).  Work and work Statistician.  Reproductive health. Screening  You may have the following tests or measurements:  Height, weight, and BMI.  Blood pressure.  Lipid and cholesterol levels. These may be checked every 5 years, or more frequently if you are over 12 years old.  Skin check.  Lung cancer screening. You may have this screening every year starting at age 39 if you have a 30-pack-year history of smoking and currently smoke or have quit within the past 15 years.  Fecal occult blood test (FOBT) of the  stool. You may have this test every year starting at age 39.  Flexible sigmoidoscopy or colonoscopy. You may have a sigmoidoscopy every 5 years or a colonoscopy every 10 years starting at age 60.  Hepatitis C blood test.  Hepatitis B blood test.  Sexually transmitted disease (STD) testing.  Diabetes screening. This is done by  checking your blood sugar (glucose) after you have not eaten for a while (fasting). You may have this done every 1-3 years.  Bone density scan. This is done to screen for osteoporosis. You may have this done starting at age 21.  Mammogram. This may be done every 1-2 years. Talk to your health care provider about how often you should have regular mammograms. Talk with your health care provider about your test results, treatment options, and if necessary, the need for more tests. Vaccines  Your health care provider may recommend certain vaccines, such as:  Influenza vaccine. This is recommended every year.  Tetanus, diphtheria, and acellular pertussis (Tdap, Td) vaccine. You may need a Td booster every 10 years.  Zoster vaccine. You may need this after age 36.  Pneumococcal 13-valent conjugate (PCV13) vaccine. One dose is recommended after age 49.  Pneumococcal polysaccharide (PPSV23) vaccine. One dose is recommended after age 68. Talk to your health care provider about which screenings and vaccines you need and how often you need them. This information is not intended to replace advice given to you by your health care provider. Make sure you discuss any questions you have with your health care provider. Document Released: 06/01/2015 Document Revised: 01/23/2016 Document Reviewed: 03/06/2015 Elsevier Interactive Patient Education  2017 [place] Prevention in the Home Falls can cause injuries. They can happen to people of all ages. There are many things you can do to make your home safe and to help prevent falls. What can I do on the outside of my home?  Regularly fix the edges of walkways and driveways and fix any cracks.  Remove anything that might make you trip as you walk through a door, such as a raised step or threshold.  Trim any bushes or trees on the path to your home.  Use bright outdoor lighting.  Clear any walking paths of anything that might make someone trip,  such as rocks or tools.  Regularly check to see if handrails are loose or broken. Make sure that both sides of any steps have handrails.  Any raised decks and porches should have guardrails on the edges.  Have any leaves, snow, or ice cleared regularly.  Use sand or salt on walking paths during winter.  Clean up any spills in your garage right away. This includes oil or grease spills. What can I do in the bathroom?  Use night lights.  Install grab bars by the toilet and in the tub and shower. Do not use towel bars as grab bars.  Use non-skid mats or decals in the tub or shower.  If you need to sit down in the shower, use a plastic, non-slip stool.  Keep the floor dry. Clean up any water that spills on the floor as soon as it happens.  Remove soap buildup in the tub or shower regularly.  Attach bath mats securely with double-sided non-slip rug tape.  Do not have throw rugs and other things on the floor that can make you trip. What can I do in the bedroom?  Use night lights.  Make sure that you have a light by your bed  that is easy to reach.  Do not use any sheets or blankets that are too big for your bed. They should not hang down onto the floor.  Have a firm chair that has side arms. You can use this for support while you get dressed.  Do not have throw rugs and other things on the floor that can make you trip. What can I do in the kitchen?  Clean up any spills right away.  Avoid walking on wet floors.  Keep items that you use a lot in easy-to-reach places.  If you need to reach something above you, use a strong step stool that has a grab bar.  Keep electrical cords out of the way.  Do not use floor polish or wax that makes floors slippery. If you must use wax, use non-skid floor wax.  Do not have throw rugs and other things on the floor that can make you trip. What can I do with my stairs?  Do not leave any items on the stairs.  Make sure that there are  handrails on both sides of the stairs and use them. Fix handrails that are broken or loose. Make sure that handrails are as long as the stairways.  Check any carpeting to make sure that it is firmly attached to the stairs. Fix any carpet that is loose or worn.  Avoid having throw rugs at the top or bottom of the stairs. If you do have throw rugs, attach them to the floor with carpet tape.  Make sure that you have a light switch at the top of the stairs and the bottom of the stairs. If you do not have them, ask someone to add them for you. What else can I do to help prevent falls?  Wear shoes that:  Do not have high heels.  Have rubber bottoms.  Are comfortable and fit you well.  Are closed at the toe. Do not wear sandals.  If you use a stepladder:  Make sure that it is fully opened. Do not climb a closed stepladder.  Make sure that both sides of the stepladder are locked into place.  Ask someone to hold it for you, if possible.  Clearly mark and make sure that you can see:  Any grab bars or handrails.  First and last steps.  Where the edge of each step is.  Use tools that help you move around (mobility aids) if they are needed. These include:  Canes.  Walkers.  Scooters.  Crutches.  Turn on the lights when you go into a dark area. Replace any light bulbs as soon as they burn out.  Set up your furniture so you have a clear path. Avoid moving your furniture around.  If any of your floors are uneven, fix them.  If there are any pets around you, be aware of where they are.  Review your medicines with your doctor. Some medicines can make you feel dizzy. This can increase your chance of falling. Ask your doctor what other things that you can do to help prevent falls. This information is not intended to replace advice given to you by your health care provider. Make sure you discuss any questions you have with your health care provider. Document Released: 03/01/2009  Document Revised: 10/11/2015 Document Reviewed: 06/09/2014 Elsevier Interactive Patient Education  2017 [employer].

## 2020-01-06 NOTE — Progress Notes (Signed)
Please call patient and let them  know their  low dose Ct was read as a Lung RADS 2: nodules that are benign in appearance and behavior with a very low likelihood of becoming a clinically active cancer due to size or lack of growth. Recommendation per radiology is for a repeat LDCT in 12 months. .Please let them  know we will order and schedule their  annual screening scan for 12/2020. Please let them  know there was notation of CAD on their  scan.  Please remind the patient  that this is a non-gated exam therefore degree or severity of disease  cannot be determined. Please have them  follow up with their PCP regarding potential risk factor modification, dietary therapy or pharmacologic therapy if clinically indicated. Pt.  is  currently on statin therapy. Please place order for annual  screening scan for  12/2020 and fax results to PCP. Thanks so much. 

## 2020-01-06 NOTE — Progress Notes (Addendum)
Subjective:   Abigail Willis is a 74 y.o. female who presents for Medicare Annual (Subsequent) preventive examination.  Review of Systems    No ROS.  Medicare Wellness Virtual Visit.   Cardiac Risk Factors include: advanced age (>54men, >69 women);smoking/ tobacco exposure     Objective:    Today's Vitals   01/06/20 1137  Weight: 149 lb (67.6 kg)  Height: 5' 0.5" (1.537 m)   Body mass index is 28.62 kg/m.  Advanced Directives 01/06/2020 12/14/2018 11/27/2016 01/22/2016  Does Patient Have a Medical Advance Directive? Yes No Yes Yes  Type of Paramedic of [place];Living will - [place];Living will [place];Living will  Does patient want to make changes to medical advance directive? No - Patient declined - - -  Copy of [place] in Chart? No - copy requested - No - copy requested -  Would patient like information on creating a medical advance directive? - No - Patient declined - -    Current Medications (verified) Outpatient Encounter Medications as of 01/06/2020  Medication Sig   acetaminophen (TYLENOL) 500 MG tablet Take 500 mg by mouth as needed.   aspirin 81 MG tablet Take 81 mg by mouth daily.     gabapentin (NEURONTIN) 100 MG capsule Take 1 capsule (100 mg total) by mouth 3 (three) times daily.   hyoscyamine (LEVBID) 0.375 MG 12 hr tablet Take 1 tablet (0.375 mg total) by mouth every 12 (twelve) hours as needed.   ibuprofen (ADVIL,MOTRIN) 200 MG tablet Take 200 mg by mouth as needed.     pravastatin (PRAVACHOL) 10 MG tablet Take 1 tablet (10 mg total) by mouth every evening.   pseudoephedrine-guaifenesin (MUCINEX D) 60-600 MG per tablet Take 1 tablet by mouth every 12 (twelve) hours as needed.     sodium chloride (OCEAN) 0.65 % SOLN nasal spray Place 1 spray into both nostrils as needed for congestion.   No facility-administered encounter medications on file as of 01/06/2020.      Allergies (verified) Avelox [moxifloxacin hcl in nacl] and Penicillins   History: Past Medical History:  Diagnosis Date   Allergy    SINUS   Colon polyps 11/14/2013   Tubular adenoma, hyperplastic polyps   Diverticulosis 11/15/2010   Moderate   External hemorrhoids    Hyperlipidemia    IBS (irritable bowel syndrome)    Squamous cell carcinoma in situ    Vertigo    Past Surgical History:  Procedure Laterality Date   BREAST BIOPSY     COLONOSCOPY     GANGLION CYST EXCISION     HAMMER TOE SURGERY     HEMORRHOID BANDING     LEEP      twice, in [place]  2019   TONSILLECTOMY     TUBAL LIGATION     Family History  Problem Relation Age of Onset   Heart attack Mother 28   Ovarian cancer Mother    Asthma Father    Brain cancer Sister    Colon cancer Cousin    Breast cancer Neg Hx    Social History   Socioeconomic History   Marital status: Married    Spouse name: Not on file   Number of children: 1   Years of education: Not on file   Highest education level: Not on file  Occupational History   Occupation: Retired  Tobacco Use   Smoking status: Current Every Day Smoker  Packs/day: 1.00    Years: 52.00    Pack years: 52.00    Types: Cigarettes   Smokeless tobacco: Never Used  Vaping Use   Vaping Use: Never used  Substance and Sexual Activity   Alcohol use: Yes    Alcohol/week: 1.0 standard drink    Types: 1 Glasses of wine per week    Comment: SOCIAL   Drug use: No   Sexual activity: Yes  Other Topics Concern   Not on file  Social History Narrative   Moved from [place] to be closer to her daughter, spends winters at [facility] home.      Has HPOA- husband Cletus Gash.   Would desire feeding tube, no prolonged life support.   Not sure about feeding tubes.            Social Determinants of Health   Financial Resource Strain: Low Risk    Difficulty of Paying Living Expenses: Not hard at all   Food Insecurity: No Food Insecurity   Worried About Charity fundraiser in the Last Year: Never true   [place] in the Last Year: Never true  Transportation Needs: No Transportation Needs   Lack of Transportation (Medical): No   Lack of Transportation (Non-Medical): No  Physical Activity:    Days of Exercise per Week: Not on file   Minutes of Exercise per Session: Not on file  Stress: No Stress Concern Present   Feeling of Stress : Not at all  Social Connections: Unknown   Frequency of Communication with Friends and Family: More than three times a week   Frequency of Social Gatherings with Friends and Family: More than three times a week   Attends Religious Services: Not on Electrical engineer or Organizations: Yes   Attends Music therapist: More than 4 times per year   Marital Status: Married    Tobacco Counseling Ready to quit: Not Answered Counseling given: Not Answered   Clinical Intake:  Pre-visit preparation completed: Yes        Diabetes: No  How often do you need to have someone help you when you read instructions, pamphlets, or other written materials from your doctor or pharmacy?: 1 - Never Interpreter Needed?: No      Activities of Daily Living In your present state of health, do you have any difficulty performing the following activities: 01/06/2020  Hearing? N  Vision? N  Difficulty concentrating or making decisions? N  Walking or climbing stairs? N  Dressing or bathing? N  Doing errands, shopping? N  Preparing Food and eating ? N  Using the Toilet? N  In the past six months, have you accidently leaked urine? N  Do you have problems with loss of bowel control? N  Managing your Medications? N  Managing your Finances? N  Housekeeping or managing your Housekeeping? N  Some recent data might be hidden    Patient Care Team: Burnard Hawthorne, FNP as PCP - General (Family Medicine) Milus Banister, MD  (Gastroenterology) Leandrew Koyanagi, MD (Ophthalmology) Angelique Blonder, DO (Dermatology)  Indicate any recent Medical Services you may have received from other than [facility] providers in the past year (date may be approximate).     Assessment:   This is a routine wellness examination for Abigail Willis.  I connected with Abigail Willis today by telephone and verified that I am speaking with the correct person using two identifiers. Location patient: home Location provider: work Persons  participating in the virtual visit: patient, nurse.    I discussed the limitations, risks, security and privacy concerns of performing an evaluation and management service by telephone and the availability of in person appointments. The patient expressed understanding and verbally consented to this telephonic visit.    Interactive audio and video telecommunications were attempted between this provider and patient, however failed, due to patient having technical difficulties OR patient did not have access to video capability.  We continued and completed visit with audio only.  Some vital signs may be absent or patient reported.   Hearing/Vision screen  Hearing Screening   125Hz  250Hz  500Hz  1000Hz  2000Hz  3000Hz  4000Hz  6000Hz  8000Hz   Right ear:           Left ear:           Comments: Patient is able to hear conversational tones without difficulty.  No issues reported.  Vision Screening Comments: Followed by [facility] Wears corrective lenses Visual acuity not assessed, virtual visit.  They have seen their ophthalmologist in the last 12 months.    Dietary issues and exercise activities discussed:  Healthy diet Good water  Goals     Maintain Healthy Lifestyle     Stay active Stay hydrated  Healthy diet      Depression Screen PHQ 2/9 Scores 01/06/2020 12/29/2018 09/15/2017 12/01/2016 11/27/2016 11/27/2015 11/23/2014  PHQ - 2 Score 0 0 0 0 0 0 0    Fall Risk Fall Risk  01/06/2020 11/29/2019 12/29/2018 09/15/2017  11/27/2016  Falls in the past year? 0 0 0 No No  Number falls in past yr: 0 - - - -  Injury with Fall? - - - - -  Follow up - Falls evaluation completed Falls evaluation completed - -   Handrails in use when climbing stairs? Yes  Home free of loose throw rugs in walkways, pet beds, electrical cords, etc? Yes  Adequate lighting in your home to reduce risk of falls? Yes   TIMED UP AND GO:  Was the test performed? No . Virtual visit.   Cognitive Function: MMSE - Mini Mental State Exam 11/27/2016  Orientation to time 5  Orientation to Place 5  Registration 3  Attention/ Calculation 0  Recall 3  Language- name 2 objects 0  Language- repeat 1  Language- follow 3 step command 3  Language- read & follow direction 0  Write a sentence 0  Copy design 0  Total score 20        Immunizations Immunization History  Administered Date(s) Administered   Fluad Quad(high Dose 65+) 02/08/2019   Influenza Split 03/14/2011, 02/20/2012   Influenza Whole 03/21/2010   Influenza, High Dose Seasonal PF 02/18/2017   Influenza,inj,Quad PF,6+ Mos 02/14/2013, 02/27/2014, 01/25/2015   Influenza-Unspecified 02/18/2017   Moderna SARS-COVID-2 Vaccination 05/26/2019, 06/23/2019   Pneumococcal Conjugate-13 11/21/2013   Pneumococcal Polysaccharide-23 09/03/2010   Td 09/03/2010   Zoster 08/22/2009   Zoster Recombinat (Shingrix) 09/15/2017    Health Maintenance  Health Maintenance  Topic Date Due   COLONOSCOPY  01/22/2019   INFLUENZA VACCINE  12/18/2019   MAMMOGRAM  02/14/2020   TETANUS/TDAP  09/02/2020   DEXA SCAN  Completed   COVID-19 Vaccine  Completed   Hepatitis C Screening  Completed   PNA vac Low Risk Adult  Completed    Dental Screening: Recommended annual dental exams for proper oral hygiene. Visits every 4 months.  Community Resource Referral / Chronic Care Management: CRR required this visit?  No   CCM  required this visit?  No      Plan:   Keep all routine  maintenance appointments.   Cpe 02/21/20  I have personally reviewed and noted the following in the patients chart:    Medical and social history  Use of alcohol, tobacco or illicit drugs   Current medications and supplements  Functional ability and status  Nutritional status  Physical activity  Advanced directives  List of other physicians  Hospitalizations, surgeries, and ER visits in previous 12 months  Vitals  Screenings to include cognitive, depression, and falls  Referrals and appointments  In addition, I have reviewed and discussed with patient certain preventive protocols, quality metrics, and best practice recommendations. A written personalized care plan for preventive services as well as general preventive health recommendations were provided to patient via mychart.     Varney Biles, LPN   [id number]     Agree with plan. Mable Paris, NP

## 2020-01-09 ENCOUNTER — Other Ambulatory Visit: Payer: Self-pay | Admitting: *Deleted

## 2020-01-09 DIAGNOSIS — F17210 Nicotine dependence, cigarettes, uncomplicated: Secondary | ICD-10-CM

## 2020-01-11 ENCOUNTER — Telehealth: Payer: Self-pay | Admitting: Family

## 2020-01-11 NOTE — Telephone Encounter (Signed)
Mail letter   Abigail Willis,   [place] you are doing well.   I received results from your annual CT lung scan from [facility] which showed benign findings of lungs. You will need do this again next year so please ensure you are contacted in regards to this and certainly call our office if you are not contacted for another test.   It was noted again on the exam that you have coronary artery atherosclerosis, or often referred to as hardening of the arteries.   Please continue taking the pravastatin.  Look forward to seeing you!   Best,   Mable Paris, NP

## 2020-01-11 NOTE — Telephone Encounter (Signed)
Printed and mailed

## 2020-01-17 ENCOUNTER — Other Ambulatory Visit: Payer: Self-pay

## 2020-01-17 ENCOUNTER — Ambulatory Visit: Payer: Medicare Other | Admitting: Diagnostic Neuroimaging

## 2020-01-17 ENCOUNTER — Encounter: Payer: Self-pay | Admitting: Diagnostic Neuroimaging

## 2020-01-17 VITALS — BP 122/68 | HR 82 | Ht 60.5 in | Wt 152.0 lb

## 2020-01-17 DIAGNOSIS — R202 Paresthesia of skin: Secondary | ICD-10-CM

## 2020-01-17 DIAGNOSIS — R200 Anesthesia of skin: Secondary | ICD-10-CM

## 2020-01-17 NOTE — Progress Notes (Signed)
[facility]  PATIENT: Abigail Willis [place] DOB: 05/02/1946  REFERRING CLINICIAN: Burnard Hawthorne, FNP HISTORY FROM: patient  REASON FOR VISIT: new consult    HISTORICAL  CHIEF COMPLAINT:  Chief Complaint  Patient presents with  . New Patient (Initial Visit)    pt alone, rm 6. pt describes having numbness in fingers on right hand and feels like someone is snapping a rubberband on her palm. in june she developed this and it was really bad until 2 weeks ago. no imaging completed.    HISTORY OF PRESENT ILLNESS:   74 year old female here for evaluation of right hand numbness.  Symptoms started in June 2021.  She had pain and numbness in her right upper extremity from her elbow up to her shoulder, then into her right hand.  Symptoms spontaneously improved over past few weeks.  Now has some numbness in her right hand digits 1-3.  Patient had similar numbness and pain in left upper extremity 10 to 15 years ago, had EMG nerve conduction study, managed conservatively.   REVIEW OF SYSTEMS: Full 14 system review of systems performed and negative with exception of: As per HPI.  ALLERGIES: Allergies  Allergen Reactions  . Avelox [Moxifloxacin Hcl In Nacl] Other (See Comments)    Severe stomach cramping and nausea  . Penicillins Rash    HOME MEDICATIONS: Outpatient Medications Prior to Visit  Medication Sig Dispense Refill  . acetaminophen (TYLENOL) 500 MG tablet Take 500 mg by mouth as needed.    Marland Kitchen aspirin 81 MG tablet Take 81 mg by mouth daily.      Marland Kitchen gabapentin (NEURONTIN) 100 MG capsule Take 1 capsule (100 mg total) by mouth 3 (three) times daily. 90 capsule 3  . hyoscyamine (LEVBID) 0.375 MG 12 hr tablet Take 1 tablet (0.375 mg total) by mouth every 12 (twelve) hours as needed. 60 tablet 2  . ibuprofen (ADVIL,MOTRIN) 200 MG tablet Take 200 mg by mouth as needed.      . pravastatin (PRAVACHOL) 10 MG tablet Take 1 tablet (10 mg total) by mouth every  evening. 90 tablet 1  . pseudoephedrine-guaifenesin (MUCINEX D) 60-600 MG per tablet Take 1 tablet by mouth every 12 (twelve) hours as needed.      . sodium chloride (OCEAN) 0.65 % SOLN nasal spray Place 1 spray into both nostrils as needed for congestion.     No facility-administered medications prior to visit.    PAST MEDICAL HISTORY: Past Medical History:  Diagnosis Date  . Allergy    SINUS  . Colon polyps 11/14/2013   Tubular adenoma, hyperplastic polyps  . Diverticulosis 11/15/2010   Moderate  . External hemorrhoids   . Hyperlipidemia   . IBS (irritable bowel syndrome)   . Squamous cell carcinoma in situ   . Vertigo     PAST SURGICAL HISTORY: Past Surgical History:  Procedure Laterality Date  . BREAST BIOPSY    . COLONOSCOPY    . GANGLION CYST EXCISION    . HAMMER TOE SURGERY    . HEMORRHOID BANDING    . LEEP      twice, in [place]  . MOHS SURGERY  2019  . TONSILLECTOMY    . TUBAL LIGATION      FAMILY HISTORY: Family History  Problem Relation Age of Onset  . Heart attack Mother 69  . Ovarian cancer Mother   . Asthma Father   . Brain cancer Sister   . Colon cancer Cousin   . Breast cancer  Neg Hx     SOCIAL HISTORY: Social History   Socioeconomic History  . Marital status: Married    Spouse name: Not on file  . Number of children: 1  . Years of education: Not on file  . Highest education level: Not on file  Occupational History  . Occupation: Retired  Tobacco Use  . Smoking status: Current Every Day Smoker    Packs/day: 1.00    Years: 52.00    Pack years: 52.00    Types: Cigarettes  . Smokeless tobacco: Never Used  Vaping Use  . Vaping Use: Never used  Substance and Sexual Activity  . Alcohol use: Yes    Alcohol/week: 1.0 standard drink    Types: 1 Glasses of wine per week    Comment: SOCIAL  . Drug use: No  . Sexual activity: Yes  Other Topics Concern  . Not on file  Social History Narrative   Moved from [place] to be closer to her  daughter, spends winters at [facility] home.      Has HPOA- husband Cletus Gash.   Would desire feeding tube, no prolonged life support.   Not sure about feeding tubes.            Social Determinants of Health   Financial Resource Strain: Low Risk   . Difficulty of Paying Living Expenses: Not hard at all  Food Insecurity: No Food Insecurity  . Worried About Charity fundraiser in the Last Year: Never true  . Ran Out of Food in the Last Year: Never true  Transportation Needs: No Transportation Needs  . Lack of Transportation (Medical): No  . Lack of Transportation (Non-Medical): No  Physical Activity:   . Days of Exercise per Week: Not on file  . Minutes of Exercise per Session: Not on file  Stress: No Stress Concern Present  . Feeling of Stress : Not at all  Social Connections: Unknown  . Frequency of Communication with Friends and Family: More than three times a week  . Frequency of Social Gatherings with Friends and Family: More than three times a week  . Attends Religious Services: Not on file  . Active Member of Clubs or Organizations: Yes  . Attends Archivist Meetings: More than 4 times per year  . Marital Status: Married  Human resources officer Violence: Not At Risk  . Fear of Current or Ex-Partner: No  . Emotionally Abused: No  . Physically Abused: No  . Sexually Abused: No     PHYSICAL EXAM  GENERAL EXAM/CONSTITUTIONAL: Vitals:  Vitals:   01/17/20 1540  BP: 122/68  Pulse: 82  Weight: 152 lb (68.9 kg)  Height: 5' 0.5" (1.537 m)     Body mass index is 29.2 kg/m. Wt Readings from Last 3 Encounters:  01/17/20 152 lb (68.9 kg)  01/06/20 149 lb (67.6 kg)  11/29/19 149 lb 12.8 oz (67.9 kg)     Patient is in no distress; well developed, nourished and groomed; neck is supple  CARDIOVASCULAR:  Examination of carotid arteries is normal; no carotid bruits  Regular rate and rhythm, no murmurs  Examination of peripheral vascular system by  observation and palpation is normal  EYES:  Ophthalmoscopic exam of optic discs and posterior segments is normal; no papilledema or hemorrhages  No exam data present  MUSCULOSKELETAL:  Gait, strength, tone, movements noted in Neurologic exam below  NEUROLOGIC: MENTAL STATUS:  MMSE - Mini Mental State Exam 11/27/2016  Orientation to time 5  Orientation  to Place 5  Registration 3  Attention/ Calculation 0  Recall 3  Language- name 2 objects 0  Language- repeat 1  Language- follow 3 step command 3  Language- read & follow direction 0  Write a sentence 0  Copy design 0  Total score 20    awake, alert, oriented to person, place and time  recent and remote memory intact  normal attention and concentration  language fluent, comprehension intact, naming intact  fund of knowledge appropriate  CRANIAL NERVE:   2nd - no papilledema on fundoscopic exam  2nd, 3rd, 4th, 6th - pupils equal and reactive to light, visual fields full to confrontation, extraocular muscles intact, no nystagmus  5th - facial sensation symmetric  7th - facial strength symmetric  8th - hearing intact  9th - palate elevates symmetrically, uvula midline  11th - shoulder shrug symmetric  12th - tongue protrusion midline  MOTOR:   normal bulk and tone, full strength in the BUE, BLE; EXCEPT MILD ATROPHY AND WEAKNESS OF RIGHT APB  SENSORY:   normal and symmetric to light touch, temperature, vibration; EXCEPT DECR IN RIGHT HAND DIGITS 1-3 TO PP; NEG PHALENS  COORDINATION:   finger-nose-finger, fine finger movements normal  REFLEXES:   deep tendon reflexes TRACE and symmetric  GAIT/STATION:   narrow based gait     DIAGNOSTIC DATA (LABS, IMAGING, TESTING) - I reviewed patient records, labs, notes, testing and imaging myself where available.  Lab Results  Component Value Date   WBC 8.6 11/29/2019   HGB 16.2 (H) 11/29/2019   HCT 48.3 (H) 11/29/2019   MCV 91.6 11/29/2019   PLT  257.0 11/29/2019      Component Value Date/Time   NA 139 11/29/2019 0945   K 4.2 11/29/2019 0945   CL 104 11/29/2019 0945   CO2 28 11/29/2019 0945   GLUCOSE 101 (H) 11/29/2019 0945   BUN 9 11/29/2019 0945   CREATININE 0.57 11/29/2019 0945   CALCIUM 9.3 11/29/2019 0945   PROT 6.9 11/29/2019 0945   ALBUMIN 4.1 11/29/2019 0945   AST 16 11/29/2019 0945   ALT 11 11/29/2019 0945   ALKPHOS 70 11/29/2019 0945   BILITOT 0.4 11/29/2019 0945   GFRNONAA >60 12/14/2018 0903   GFRAA >60 12/14/2018 0903   Lab Results  Component Value Date   CHOL 189 11/29/2019   HDL 76.70 11/29/2019   LDLCALC 87 11/29/2019   LDLDIRECT 116.2 03/14/2011   TRIG 125.0 11/29/2019   CHOLHDL 2 11/29/2019   Lab Results  Component Value Date   HGBA1C 5.6 11/29/2019   Lab Results  Component Value Date   VITAMINB12 254 11/29/2019   Lab Results  Component Value Date   TSH 2.39 11/29/2019       ASSESSMENT AND PLAN  74 y.o. year old female here with right upper extremity numbness and tingling, likely carpal tunnel syndrome.  Dx:  1. Numbness and tingling in right hand      PLAN:  Right hand numbness - check EMG/NCS - wrist splint at bedtime  Orders Placed This Encounter  Procedures  . NCV with EMG(electromyography)   Return for for NCV/EMG.    Penni Bombard, MD 12/26/1749, 0:25 PM Certified in Neurology, Neurophysiology and Neuroimaging  [facility] [address], [place], [place] [address] [phone]

## 2020-01-17 NOTE — Patient Instructions (Signed)
Right hand numbness - check EMG/NCS - wrist splint at bedtime

## 2020-02-09 ENCOUNTER — Other Ambulatory Visit: Payer: Self-pay

## 2020-02-09 ENCOUNTER — Encounter: Payer: Medicare Other | Admitting: Diagnostic Neuroimaging

## 2020-02-09 ENCOUNTER — Ambulatory Visit (INDEPENDENT_AMBULATORY_CARE_PROVIDER_SITE_OTHER): Payer: Medicare Other | Admitting: Diagnostic Neuroimaging

## 2020-02-09 DIAGNOSIS — Z0289 Encounter for other administrative examinations: Secondary | ICD-10-CM

## 2020-02-09 DIAGNOSIS — R202 Paresthesia of skin: Secondary | ICD-10-CM | POA: Diagnosis not present

## 2020-02-09 DIAGNOSIS — R200 Anesthesia of skin: Secondary | ICD-10-CM | POA: Diagnosis not present

## 2020-02-09 NOTE — Procedures (Signed)
[facility]  NCS (NERVE CONDUCTION STUDY) WITH EMG (ELECTROMYOGRAPHY) REPORT   STUDY DATE: 02/09/20 PATIENT NAME: Abigail Willis DOB: 1946/02/16 MRN: [id number]  ORDERING CLINICIAN: Andrey Spearman, MD   TECHNOLOGIST: Sherre Scarlet ELECTROMYOGRAPHER: Earlean Polka. Valencia Kassa, MD  CLINICAL INFORMATION: 74 year old female with right hand numbness and pain.  FINDINGS: NERVE CONDUCTION STUDY:  Right median motor responses prolonged distal latency, decreased amplitude and normal conduction velocity.  Martin-Gruber anastomosis also noted.  Left median and right ulnar motor responses are normal.  Right median sensory response could not be obtained.  Left median sensory response has prolonged peak latency.  Right ulnar sensory response is normal.  Right ulnar F-wave latency is normal.   NEEDLE ELECTROMYOGRAPHY:  Needle examination of right upper extremity is normal.   IMPRESSION:   Abnormal study demonstrating: -Moderate to severe right median neuropathy at the right wrist consistent with right carpal tunnel syndrome. -Mild asymptomatic left median neuropathy at the left wrist.    INTERPRETING PHYSICIAN:  Penni Bombard, MD Certified in Neurology, Neurophysiology and Neuroimaging  [facility] [address], [place], [place] [address] [phone]   Oval    Nerve / Sites Muscle Latency Ref. Amplitude Ref. Rel Amp Segments Distance Velocity Ref. Area    ms ms mV mV %  cm m/s m/s mVms  R Median - APB     Wrist APB 8.1 ?4.4 0.4 ?4.0 100 Wrist - APB 7   2.1     Upper arm APB 11.2  2.0  519 Upper arm - Wrist 20 63 ?49 7.6     Ulnar Wrist APB 3.9  4.1  208 Ulnar Wrist - APB    18.2  L Median - APB     Wrist APB 4.4 ?4.4 4.7 ?4.0 100 Wrist - APB 7   19.6     Upper arm APB 8.4  3.7  77.1 Upper arm - Wrist 19 49 ?49 15.0  R Ulnar - ADM     Wrist ADM 2.7 ?3.3 9.7 ?6.0 100 Wrist - ADM 7   37.5     B.Elbow ADM 5.8   8.9  91.9 B.Elbow - Wrist 19 61 ?49 35.0     A.Elbow ADM 7.5  6.7  74.5 A.Elbow - B.Elbow 10 58 ?49 26.9           SNC    Nerve / Sites Rec. Site Peak Lat Ref.  Amp Ref. Segments Distance    ms ms V V  cm  R Median - Orthodromic (Dig II, Mid palm)     Dig II Wrist NR ?3.4 NR ?10 Dig II - Wrist 13  L Median - Orthodromic (Dig II, Mid palm)     Dig II Wrist 3.6 ?3.4 16 ?10 Dig II - Wrist 13  R Ulnar - Orthodromic, (Dig V, Mid palm)     Dig V Wrist 2.6 ?3.1 8 ?5 Dig V - Wrist 15           F  Wave    Nerve F Lat Ref.   ms ms  R Ulnar - ADM 26.8 ?32.0       EMG Summary Table    Spontaneous MUAP Recruitment  Muscle IA Fib PSW Fasc Other Amp Dur. Poly Pattern  R. Deltoid Normal None None None _______ Normal Normal Normal Normal  R. Biceps brachii Normal None None None _______ Normal Normal Normal Normal  R. Triceps brachii Normal None None None  _______ Normal Normal Normal Normal  R. Flexor carpi radialis Normal None None None _______ Normal Normal Normal Normal  R. First dorsal interosseous Normal None None None _______ Normal Normal Normal Normal

## 2020-02-15 ENCOUNTER — Ambulatory Visit
Admission: RE | Admit: 2020-02-15 | Discharge: 2020-02-15 | Disposition: A | Discharge status: Home / Self Care | Payer: Medicare Other | Source: Ambulatory Visit | Attending: Family | Admitting: Family

## 2020-02-15 ENCOUNTER — Other Ambulatory Visit: Payer: Self-pay

## 2020-02-15 DIAGNOSIS — Z780 Asymptomatic menopausal state: Secondary | ICD-10-CM

## 2020-02-15 DIAGNOSIS — Z1231 Encounter for screening mammogram for malignant neoplasm of breast: Secondary | ICD-10-CM

## 2020-02-21 ENCOUNTER — Encounter: Payer: Medicare Other | Admitting: Family

## 2020-02-21 ENCOUNTER — Telehealth: Payer: Self-pay | Admitting: Family

## 2020-02-21 ENCOUNTER — Ambulatory Visit (INDEPENDENT_AMBULATORY_CARE_PROVIDER_SITE_OTHER): Payer: Medicare Other | Admitting: Family

## 2020-02-21 ENCOUNTER — Other Ambulatory Visit: Payer: Self-pay

## 2020-02-21 ENCOUNTER — Encounter: Payer: Self-pay | Admitting: Family

## 2020-02-21 VITALS — BP 132/62 | HR 87 | Temp 98.1°F | Ht 60.51 in | Wt 151.8 lb

## 2020-02-21 DIAGNOSIS — Q891 Congenital malformations of adrenal gland: Secondary | ICD-10-CM

## 2020-02-21 DIAGNOSIS — E785 Hyperlipidemia, unspecified: Secondary | ICD-10-CM | POA: Diagnosis not present

## 2020-02-21 DIAGNOSIS — Z0000 Encounter for general adult medical examination without abnormal findings: Secondary | ICD-10-CM | POA: Diagnosis not present

## 2020-02-21 DIAGNOSIS — Z136 Encounter for screening for cardiovascular disorders: Secondary | ICD-10-CM | POA: Diagnosis not present

## 2020-02-21 DIAGNOSIS — D582 Other hemoglobinopathies: Secondary | ICD-10-CM | POA: Diagnosis not present

## 2020-02-21 DIAGNOSIS — M810 Age-related osteoporosis without current pathological fracture: Secondary | ICD-10-CM

## 2020-02-21 DIAGNOSIS — Z7982 Long term (current) use of aspirin: Secondary | ICD-10-CM | POA: Insufficient documentation

## 2020-02-21 DIAGNOSIS — I700 Atherosclerosis of aorta: Secondary | ICD-10-CM

## 2020-02-21 NOTE — Progress Notes (Signed)
Subjective:    Patient ID: Abigail Willis, female    DOB: 1946/04/05, 74 y.o.   MRN: [id number]  CC: Abigail Willis is a 74 y.o. female who presents today for physical exam and follow up.    HPI: Feels well today No complaints Leaves for [place] for winter in a couple of days   HLD - compliant with pravastatin  Atherosclerosis, adenomatous hypertrophy, empysema. seen on CT chest.   Osteoporosis seen on recent DEXA. She has never been on medications  For osteoporosis. not taking calcium or vitamin d. No upcoming dental procedures.     Ca 9.3 11/2019, Crt 0.57 Elevated RBCs she is following with neurology however has not sleep study No ha, fatigue.   No bleeding from ASA.     Colorectal Cancer Screening: UTD, she spoke with Dr Ardis Hughs office and repeat in 5 years, due 2022 Breast Cancer Screening: Mammogram UTD Cervical Cancer Screening: No longer screening for cervical cancer. No vaginal bleeding. No abdominal swelling, distention.     Lung Cancer Screening: done 12/2019  Meets AAA screen criteria.         Tetanus - utd        Pneumococcal - Complete  Labs: Screening labs done prior Exercise: Gets regular exercise.   Alcohol use:  occassionally Smoking/tobacco use: smoker.   H/o SCC- follows with dermatology   HISTORY:  Past Medical History:  Diagnosis Date  . Allergy    SINUS  . Colon polyps 11/14/2013   Tubular adenoma, hyperplastic polyps  . Diverticulosis 11/15/2010   Moderate  . External hemorrhoids   . Hyperlipidemia   . IBS (irritable bowel syndrome)   . Squamous cell carcinoma in situ   . Vertigo     Past Surgical History:  Procedure Laterality Date  . BREAST BIOPSY    . COLONOSCOPY    . GANGLION CYST EXCISION    . HAMMER TOE SURGERY    . HEMORRHOID BANDING    . LEEP      twice, in [place]  . MOHS SURGERY  2019  . TONSILLECTOMY    . TUBAL LIGATION     Family History  Problem Relation Age of Onset  . Heart attack  Mother 75  . Ovarian cancer Mother   . Asthma Father   . Brain cancer Sister   . Colon cancer Cousin   . Breast cancer Neg Hx       ALLERGIES: Avelox [moxifloxacin hcl in nacl] and Penicillins  Current Outpatient Medications on File Prior to Visit  Medication Sig Dispense Refill  . acetaminophen (TYLENOL) 500 MG tablet Take 500 mg by mouth as needed.    Marland Kitchen aspirin 81 MG tablet Take 81 mg by mouth daily.      . hyoscyamine (LEVBID) 0.375 MG 12 hr tablet Take 1 tablet (0.375 mg total) by mouth every 12 (twelve) hours as needed. 60 tablet 2  . ibuprofen (ADVIL,MOTRIN) 200 MG tablet Take 200 mg by mouth as needed.      . pravastatin (PRAVACHOL) 10 MG tablet Take 1 tablet (10 mg total) by mouth every evening. 90 tablet 1  . pseudoephedrine-guaifenesin (MUCINEX D) 60-600 MG per tablet Take 1 tablet by mouth every 12 (twelve) hours as needed.      . sodium chloride (OCEAN) 0.65 % SOLN nasal spray Place 1 spray into both nostrils as needed for congestion.     No current facility-administered medications on file prior to visit.    Social  History   Tobacco Use  . Smoking status: Current Every Day Smoker    Packs/day: 1.00    Years: 52.00    Pack years: 52.00    Types: Cigarettes  . Smokeless tobacco: Never Used  Vaping Use  . Vaping Use: Never used  Substance Use Topics  . Alcohol use: Yes    Alcohol/week: 1.0 standard drink    Types: 1 Glasses of wine per week    Comment: SOCIAL  . Drug use: No    Review of Systems  Constitutional: Negative for chills, fever and unexpected weight change.  HENT: Negative for congestion.   Respiratory: Negative for cough.   Cardiovascular: Negative for chest pain, palpitations and leg swelling.  Gastrointestinal: Negative for nausea and vomiting.  Musculoskeletal: Negative for arthralgias and myalgias.  Skin: Negative for rash.  Neurological: Negative for headaches.  Hematological: Negative for adenopathy. Does not bruise/bleed easily.   Psychiatric/Behavioral: Negative for confusion.      Objective:    BP 132/62 (BP Location: Left Arm, Patient Position: Sitting)   Pulse 87   Temp 98.1 F (36.7 C)   Ht 5' 0.51" (1.537 m)   Wt 151 lb 12.8 oz (68.9 kg)   SpO2 95%   BMI 29.15 kg/m   BP Readings from Last 3 Encounters:  02/21/20 132/62  01/17/20 122/68  11/29/19 122/68   Wt Readings from Last 3 Encounters:  02/21/20 151 lb 12.8 oz (68.9 kg)  01/17/20 152 lb (68.9 kg)  01/06/20 149 lb (67.6 kg)    Physical Exam Vitals reviewed.  Constitutional:      Appearance: She is well-developed.  Eyes:     Conjunctiva/sclera: Conjunctivae normal.  Neck:     Thyroid: No thyroid mass or thyromegaly.  Cardiovascular:     Rate and Rhythm: Normal rate and regular rhythm.     Pulses: Normal pulses.     Heart sounds: Normal heart sounds.  Pulmonary:     Effort: Pulmonary effort is normal.     Breath sounds: Normal breath sounds. No wheezing, rhonchi or rales.  Lymphadenopathy:     Head:     Right side of head: No submental, submandibular, tonsillar, preauricular, posterior auricular or occipital adenopathy.     Left side of head: No submental, submandibular, tonsillar, preauricular, posterior auricular or occipital adenopathy.     Cervical: No cervical adenopathy.  Skin:    General: Skin is warm and dry.  Neurological:     Mental Status: She is alert.  Psychiatric:        Speech: Speech normal.        Behavior: Behavior normal.        Thought Content: Thought content normal.        Assessment & Plan:   Problem List Items Addressed This Visit      Cardiovascular and Mediastinum   Aortic atherosclerosis ([place])    Stable, continue pravachol 10mg . Declines consult for risk stratification with cardiology.        Endocrine   Adrenal gland anomaly    adenomatous hypertrophy seen on CT lung cancer screen; I have sent a message to Dr Loanne Drilling, endocrine in regards to if further work up , surveillance required.  Patient will call the office if she has not heard back in regards to this.         Musculoskeletal and Integument   Osteoporosis    Candidate for fosamax, discussed risks of this medication including atypical long bone fracture, esophagitis, and jaw necrosis.  She would like to optimize calcium, vitamin D first and read information in regards fosamax. We will revisit at follow up.         Other   Elevated hemoglobin (HCC)    Suspect from long standing. Advised consult with  Hematology. She declines as leaving for [place], she will call the office when ready for this referral. She declines sleep study to evaluate for osa.      Encounter for long-term (current) aspirin use     discussed ASA use at length today. Elevated CVD risk however she is > 66 years old which increases risk of fatal gi bleed.patient is more concerned with risk of MI, ischemic stroke. She is not having falls. She would like to continue low dose aspirin and understands risks of fatal GI bleed or hemorrhagic stroke.  We will continue to discuss at future visits.        HLD (hyperlipidemia)    Stable, continue pravastatin.      Routine physical examination - Primary    CBE performed. Encouraged continued exercise and smoking cessation. She will continue CT lung cancer screen.       Screening for AAA (abdominal aortic aneurysm)   Relevant Orders   US AORTA       I have discontinued Abigail Willis's gabapentin. I am also having her maintain her aspirin, ibuprofen, pseudoephedrine-guaifenesin, acetaminophen, sodium chloride, hyoscyamine, and pravastatin.   No orders of the defined types were placed in this encounter.   Return precautions given.   Risks, benefits, and alternatives of the medications and treatment plan prescribed today were discussed, and patient expressed understanding.   Education regarding symptom management and diagnosis given to patient on AVS.   Continue to follow with Burnard Hawthorne, FNP for routine health maintenance.   [place] and I agreed with plan.   Abigail Paris, FNP  I have spent 18 minutes with a patient including precharting,  reviewing medical records, and discussion plan of care in particular with CT lung cancer screen and discussion of asa use and treatment options of osteoporosis.

## 2020-02-21 NOTE — Telephone Encounter (Signed)
-----   Message from Renato Shin, MD sent at 02/21/2020 10:07 AM EDT ----- Yes, we should see this patient--thanks. ----- Message ----- From: Burnard Hawthorne, FNP Sent: 02/21/2020  10:02 AM EDT To: Renato Shin, MD  Dr Loanne Drilling,   Sorry to bother you.  I didn't know if this patient warranted an endocrine work up for adenomatous hypertrophy seen incidentally on CT lung cancer screen.    I would certainly appreciate your advice and will refer her to you if you think needed.   Joycelyn Schmid

## 2020-02-21 NOTE — Assessment & Plan Note (Addendum)
Suspect from long standing. Advised consult with  Hematology. She declines as leaving for [place], she will call the office when ready for this referral. She declines sleep study to evaluate for osa.

## 2020-02-21 NOTE — Assessment & Plan Note (Signed)
Candidate for fosamax, discussed risks of this medication including atypical long bone fracture, esophagitis, and jaw necrosis. She would like to optimize calcium, vitamin D first and read information in regards fosamax. We will revisit at follow up.

## 2020-02-21 NOTE — Assessment & Plan Note (Addendum)
Stable, continue pravachol 10mg . Declines consult for risk stratification with cardiology.

## 2020-02-21 NOTE — Telephone Encounter (Signed)
I spoke with pt about appt time date and location. Pt was advised npo eat or drink after midnight pt understood.

## 2020-02-21 NOTE — Assessment & Plan Note (Signed)
discussed ASA use at length today. Elevated CVD risk however she is > 75 years old which increases risk of fatal gi bleed.patient is more concerned with risk of MI, ischemic stroke. She is not having falls. She would like to continue low dose aspirin and understands risks of fatal GI bleed or hemorrhagic stroke.  We will continue to discuss at future visits.

## 2020-02-21 NOTE — Patient Instructions (Addendum)
Screen for aneurysm Let us know if you dont hear back within a week in regards to an appointment being scheduled.    As discussed, for now we will continue aspirin 75m with close attention to benefit versus risk.  Benefit being reduced risk of ischemic stroke, non fatal heart attack and colorectal cancer ( if used for 10 years). However these benefits are most apparent in adults aged 7515to 667years with a ?10% 10-year cardiovasular risk . Persons who are not at increased risk for bleeding, have a life expectancy of at least 10 years, and are willing to take low-dose aspirin daily for at least 10 years are more likely to benefit. Persons who place a higher value on the potential benefits than the potential harms may choose to initiate low-dose aspirin.  At 75 years and above the risk of gastrointestinal bleeding, falls,  hemorrhagic stroke increases therefore becoming a very personal discussion in regards to whether you continue aspirin 81 mg.   For now , we have opted to continue however please bring to my attention at follow so we can continue to have this discussion.     Please call [place] when you are [place] can refer you to hematology  We discussed  Fosamax ( alendronate) today for osteoporosis.   This medication is a biphosphonate to help preserve/maintain bone loss. It reduces bone turnover.    Fosamax can be associated with atypical hip fracture and jaw necrosis. If you have ANY dental work , oral surguries in the new future, we cannot start fosamax .    Please read the below:   For post menopausal women, guidelines recommend a diet with 1200 mg of Calcium per day. If you are eating calcium rich foods, you do not need a calcium supplement. The body better absorbs the calcium that you eat over supplementation. If you do supplement, I recommend not supplementing the full 1200 mg/ day as this can lead to increased risk of cardiovascular disease. I recommend Calcium Citrate  over the counter, and you may take a total of 600 to 800 mg per day in divided doses with meals for best absorption.   For bone health, you need adequate vitamin D, and I recommend you supplement as it is harder to do so with diet alone. I recommend cholecalciferol 800 units daily.  Also, please ensure you are following a diet high in calcium -- research shows better outcomes with dietary sources including kale, yogurt, broccolii, cheese, okra, almonds- to name a few.    Also remember that exercise is a great medicine for maintain and preserve bone health. Advise moderate exercise for 30 minutes , 3 times per week.   Fosamax (alendronate) weekly tablets What is this medicine? ALENDRONATE (a LEN droe nate) slows calcium loss from bones. It helps to make healthy bone and to slow bone loss in people with osteoporosis. It may be used to treat Paget's disease. This medicine may be used for other purposes; ask your health care provider or pharmacist if you have questions. COMMON BRAND NAME(S): Fosamax What should I tell my health care provider before I take this medicine? They need to know if you have any of these conditions:  esophagus, stomach, or intestine problems, like acid-reflux or GERD  dental disease  kidney disease  low blood calcium  low vitamin D  problems swallowing  problems sitting or standing for 30 minutes  an unusual or allergic reaction to alendronate, other medicines, foods, dyes,  or preservatives  pregnant or trying to get pregnant  breast-feeding How should I use this medicine? You must take this medicine exactly as directed or you will lower the amount of medicine you absorb into your body or you may cause yourself harm. Take your dose by mouth first thing in the morning, after you are up for the day. Do not eat or drink anything before you take this medicine. Swallow your medicine with a full glass (6 to 8 fluid ounces) of plain water. Do not take this tablet with  any other drink. Do not chew or crush the tablet. After taking this medicine, do not eat breakfast, drink, or take any medicines or vitamins for at least 30 minutes. Stand or sit up for at least 30 minutes after you take this medicine; do not lie down. Take this medicine on the same day every week. Do not take your medicine more often than directed. Talk to your pediatrician regarding the use of this medicine in children. Special care may be needed. Overdosage: If you think you have taken too much of this medicine contact a poison control center or emergency room at once. NOTE: This medicine is only for you. Do not share this medicine with others. What if I miss a dose? If you miss a dose, take the dose on the morning after you remember. Then take your next dose on your regular day of the week. Never take 2 tablets on the same day. Do not take double or extra doses. What may interact with this medicine?  aluminum hydroxide  antacids  aspirin  calcium supplements  drugs for inflammation like ibuprofen, naproxen, and others  iron supplements  magnesium supplements  vitamins with minerals This list may not describe all possible interactions. Give your health care provider a list of all the medicines, herbs, non-prescription drugs, or dietary supplements you use. Also tell them if you smoke, drink alcohol, or use illegal drugs. Some items may interact with your medicine. What should I watch for while using this medicine? Visit your doctor or health care professional for regular checks ups. It may be some time before you see benefit from this medicine. Do not stop taking your medication except on your doctor's advice. Your doctor or health care professional may order blood tests and other tests to see how you are doing. You should make sure you get enough calcium and vitamin D while you are taking this medicine, unless your doctor tells you not to. Discuss the foods you eat and the vitamins you  take with your health care professional. Some people who take this medicine have severe bone, joint, and/or muscle pain. This medicine may also increase your risk for a broken thigh bone. Tell your doctor right away if you have pain in your upper leg or groin. Tell your doctor if you have any pain that does not go away or that gets worse. This medicine can make you more sensitive to the sun. If you get a rash while taking this medicine, sunlight may cause the rash to get worse. Keep out of the sun. If you cannot avoid being in the sun, wear protective clothing and use sunscreen. Do not use sun lamps or tanning beds/booths. What side effects may I notice from receiving this medicine? Side effects that you should report to your doctor or health care professional as soon as possible:  allergic reactions like skin rash, itching or hives, swelling of the face, lips, or tongue  black or  tarry stools  bone, muscle or joint pain  changes in vision  chest pain  heartburn or stomach pain  jaw pain, especially after dental work  pain or trouble when swallowing  redness, blistering, peeling or loosening of the skin, including inside the mouth Side effects that usually do not require medical attention (report to your doctor or health care professional if they continue or are bothersome):  changes in taste  diarrhea or constipation  eye pain or itching  headache  nausea or vomiting  stomach gas or fullness This list may not describe all possible side effects. Call your doctor for medical advice about side effects. You may report side effects to FDA at 1-800-FDA-1088. Where should I keep my medicine? Keep out of the reach of children. Store at room temperature of 15 and 30 degrees C (59 and 86 degrees F). Throw away any unused medicine after the expiration date. NOTE: This sheet is a summary. It may not cover all possible information. If you have questions about this medicine, talk to your  doctor, pharmacist, or health care provider.  2020 Elsevier/Gold Standard (2011-03-20 09:02:42)    Osteoporosis   Osteoporosis happens when your bones get thin and weak. This can cause your bones to break (fracture) more easily. You can do things at home to make your bones stronger. Follow these instructions at home:   Activity 1. Exercise as told by your doctor. Ask your doctor what activities are safe for you. You should do: ? Exercises that make your muscles work to hold your body weight up (weight-bearing exercises). These include tai chi, yoga, and walking. ? Exercises to make your muscles stronger. One example is lifting weights. Lifestyle  Limit alcohol intake to no more than 1 drink a day for nonpregnant women and 2 drinks a day for men. One drink equals 12 oz of beer, 5 oz of wine, or 1 oz of hard liquor.  Do not use any products that have nicotine or tobacco in them. These include cigarettes and e-cigarettes. If you need help quitting, ask your doctor. Preventing falls 1. Use tools to help you move around (mobility aids) as needed. These include canes, walkers, scooters, and crutches. 2. Keep rooms well-lit and free of clutter. 3. Put away things that could make you trip. These include cords and rugs. 4. Install safety rails on stairs. Install grab bars in bathrooms. 5. Use rubber mats in slippery areas, like bathrooms. 6. Wear shoes that: ? Fit you well. ? Support your feet. ? Have closed toes. ? Have rubber soles or low heels. 7. Tell your doctor about all of the medicines you are taking. Some medicines can make you more likely to fall. General instructions 1. Eat plenty of calcium and vitamin D. These nutrients are good for your bones. Good sources of calcium and vitamin D include: ? Some fatty fish, such as salmon and tuna. ? Foods that have calcium and vitamin D added to them (fortified foods). For example, some breakfast cereals are fortified with calcium and  vitamin D. ? Egg yolks. ? Cheese. ? Liver. 2. Take over-the-counter and prescription medicines only as told by your doctor. 3. Keep all follow-up visits as told by your doctor. This is important. Contact a doctor if: 1. You have not been tested (screened) for osteoporosis and you are: ? A woman who is age 37 or older. ? A man who is age 87 or older. Get help right away if:  You fall.  You get  hurt. Summary  Osteoporosis happens when your bones get thin and weak.  Weak bones can break (fracture) more easily.  Eat plenty of calcium and vitamin D. These nutrients are good for your bones.  Tell your doctor about all of the medicines that you take. This information is not intended to replace advice given to you by your health care provider. Make sure you discuss any questions you have with your health care provider. Document Revised: 04/17/2017 Document Reviewed: 02/27/2017 Elsevier Patient Education  2020 [place] Maintenance for Postmenopausal Women Menopause is a normal process in which your ability to get pregnant comes to an end. This process happens slowly over many months or years, usually between the ages of 11 and 49. Menopause is complete when you have missed your menstrual periods for 12 months. It is important to talk with your health care provider about some of the most common conditions that affect women after menopause (postmenopausal women). These include heart disease, cancer, and bone loss (osteoporosis). Adopting a healthy lifestyle and getting preventive care can help to promote your health and wellness. The actions you take can also lower your chances of developing some of these common conditions. What should I know about menopause? During menopause, you may get a number of symptoms, such as:  Hot flashes. These can be moderate or severe.  Night sweats.  Decrease in sex drive.  Mood  swings.  Headaches.  Tiredness.  Irritability.  Memory problems.  Insomnia. Choosing to treat or not to treat these symptoms is a decision that you make with your health care provider. Do I need hormone replacement therapy?  Hormone replacement therapy is effective in treating symptoms that are caused by menopause, such as hot flashes and night sweats.  Hormone replacement carries certain risks, especially as you become older. If you are thinking about using estrogen or estrogen with progestin, discuss the benefits and risks with your health care provider. What is my risk for heart disease and stroke? The risk of heart disease, heart attack, and stroke increases as you age. One of the causes may be a change in the body's hormones during menopause. This can affect how your body uses dietary fats, triglycerides, and cholesterol. Heart attack and stroke are medical emergencies. There are many things that you can do to help prevent heart disease and stroke. Watch your blood pressure  High blood pressure causes heart disease and increases the risk of stroke. This is more likely to develop in people who have high blood pressure readings, are of African descent, or are overweight.  Have your blood pressure checked: ? Every 3-5 years if you are 34-48 years of age. ? Every year if you are 4 years old or older. Eat a healthy diet   Eat a diet that includes plenty of vegetables, fruits, low-fat dairy products, and lean protein.  Do not eat a lot of foods that are high in solid fats, added sugars, or sodium. Get regular exercise Get regular exercise. This is one of the most important things you can do for your health. Most adults should:  Try to exercise for at least 150 minutes each week. The exercise should increase your heart rate and make you sweat (moderate-intensity exercise).  Try to do strengthening exercises at least twice each week. Do these in addition to the moderate-intensity  exercise.  Spend less time sitting. Even light physical activity can be beneficial. Other tips  Work with your health care provider to  achieve or maintain a healthy weight.  Do not use any products that contain nicotine or tobacco, such as cigarettes, e-cigarettes, and chewing tobacco. If you need help quitting, ask your health care provider.  Know your numbers. Ask your health care provider to check your cholesterol and your blood sugar (glucose). Continue to have your blood tested as directed by your health care provider. Do I need screening for cancer? Depending on your health history and family history, you may need to have cancer screening at different stages of your life. This may include screening for:  Breast cancer.  Cervical cancer.  Lung cancer.  Colorectal cancer. What is my risk for osteoporosis? After menopause, you may be at increased risk for osteoporosis. Osteoporosis is a condition in which bone destruction happens more quickly than new bone creation. To help prevent osteoporosis or the bone fractures that can happen because of osteoporosis, you may take the following actions:  If you are 36-45 years old, get at least 1,000 mg of calcium and at least 600 mg of vitamin D per day.  If you are older than age 51 but younger than age 69, get at least 1,200 mg of calcium and at least 600 mg of vitamin D per day.  If you are older than age 20, get at least 1,200 mg of calcium and at least 800 mg of vitamin D per day. Smoking and drinking excessive alcohol increase the risk of osteoporosis. Eat foods that are rich in calcium and vitamin D, and do weight-bearing exercises several times each week as directed by your health care provider. How does menopause affect my mental health? Depression may occur at any age, but it is more common as you become older. Common symptoms of depression include:  Low or sad mood.  Changes in sleep patterns.  Changes in appetite or eating  patterns.  Feeling an overall lack of motivation or enjoyment of activities that you previously enjoyed.  Frequent crying spells. Talk with your health care provider if you think that you are experiencing depression. General instructions See your health care provider for regular wellness exams and vaccines. This may include:  Scheduling regular health, dental, and eye exams.  Getting and maintaining your vaccines. These include: ? Influenza vaccine. Get this vaccine each year before the flu season begins. ? Pneumonia vaccine. ? Shingles vaccine. ? Tetanus, diphtheria, and pertussis (Tdap) booster vaccine. Your health care provider may also recommend other immunizations. Tell your health care provider if you have ever been abused or do not feel safe at home. Summary  Menopause is a normal process in which your ability to get pregnant comes to an end.  This condition causes hot flashes, night sweats, decreased interest in sex, mood swings, headaches, or lack of sleep.  Treatment for this condition may include hormone replacement therapy.  Take actions to keep yourself healthy, including exercising regularly, eating a healthy diet, watching your weight, and checking your blood pressure and blood sugar levels.  Get screened for cancer and depression. Make sure that you are up to date with all your vaccines. This information is not intended to replace advice given to you by your health care provider. Make sure you discuss any questions you have with your health care provider. Document Revised: 04/28/2018 Document Reviewed: 04/28/2018 Elsevier Patient Education  2020 [employer].

## 2020-02-21 NOTE — Assessment & Plan Note (Signed)
CBE performed. Encouraged continued exercise and smoking cessation. She will continue CT lung cancer screen.

## 2020-02-21 NOTE — Assessment & Plan Note (Signed)
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 Patient will call the office if she has not heard back in regards to this.

## 2020-02-21 NOTE — Telephone Encounter (Signed)
Call pt I consulted with endocrine dr Loanne Drilling in regards to adrenal gland hypertrophy seen on CT lung cancer screen and he advised work up with endocrine I have placed referral for this and advise of importance on delaying seeing endocrine with upcoming trip to [place] for winter.  Let us know if you dont hear back within a week in regards to an appointment being scheduled.

## 2020-02-21 NOTE — Assessment & Plan Note (Signed)
Stable, continue pravastatin

## 2020-02-22 NOTE — Telephone Encounter (Signed)
I let patient know that endocrine referral was advised & placed. I asked that she be on the look out for their call. She said that she does leave for [place] this Sunday & doesn't know how she will be seen before she leaves for the winter.

## 2020-02-22 NOTE — Telephone Encounter (Signed)
noted 

## 2020-02-23 ENCOUNTER — Encounter: Payer: Self-pay | Admitting: Family

## 2020-02-23 ENCOUNTER — Other Ambulatory Visit: Payer: Self-pay | Admitting: Family

## 2020-02-23 DIAGNOSIS — Z136 Encounter for screening for cardiovascular disorders: Secondary | ICD-10-CM

## 2020-02-23 DIAGNOSIS — R1900 Intra-abdominal and pelvic swelling, mass and lump, unspecified site: Secondary | ICD-10-CM

## 2020-02-24 ENCOUNTER — Ambulatory Visit
Admission: RE | Admit: 2020-02-24 | Discharge: 2020-02-24 | Disposition: A | Discharge status: Home / Self Care | Payer: Medicare Other | Source: Ambulatory Visit | Attending: Family | Admitting: Family

## 2020-02-24 ENCOUNTER — Other Ambulatory Visit: Payer: Self-pay

## 2020-02-24 DIAGNOSIS — R1900 Intra-abdominal and pelvic swelling, mass and lump, unspecified site: Secondary | ICD-10-CM | POA: Diagnosis present

## 2020-02-24 DIAGNOSIS — Z136 Encounter for screening for cardiovascular disorders: Secondary | ICD-10-CM | POA: Insufficient documentation

## 2020-05-22 ENCOUNTER — Other Ambulatory Visit: Payer: Self-pay

## 2020-05-22 ENCOUNTER — Encounter: Payer: Self-pay | Admitting: Family

## 2020-05-22 DIAGNOSIS — E7800 Pure hypercholesterolemia, unspecified: Secondary | ICD-10-CM

## 2020-05-22 MED ORDER — PRAVASTATIN SODIUM 10 MG PO TABS: 10.0000 mg | ORAL_TABLET | Freq: Every evening | ORAL | 0 refills | Status: DC

## 2020-09-12 ENCOUNTER — Other Ambulatory Visit: Payer: Self-pay

## 2020-09-12 ENCOUNTER — Encounter: Payer: Self-pay | Admitting: Family

## 2020-09-12 ENCOUNTER — Ambulatory Visit: Payer: Medicare Other | Admitting: Family

## 2020-09-12 VITALS — BP 128/72 | HR 79 | Temp 97.6°F | Ht 60.5 in | Wt 151.0 lb

## 2020-09-12 DIAGNOSIS — M810 Age-related osteoporosis without current pathological fracture: Secondary | ICD-10-CM

## 2020-09-12 DIAGNOSIS — E785 Hyperlipidemia, unspecified: Secondary | ICD-10-CM

## 2020-09-12 DIAGNOSIS — R600 Localized edema: Secondary | ICD-10-CM | POA: Diagnosis not present

## 2020-09-12 DIAGNOSIS — E7800 Pure hypercholesterolemia, unspecified: Secondary | ICD-10-CM

## 2020-09-12 LAB — CBC WITH DIFFERENTIAL/PLATELET
Basophils Absolute: 0 10*3/uL (ref 0.0–0.1)
Basophils Relative: 0.7 % (ref 0.0–3.0)
Eosinophils Absolute: 0.2 10*3/uL (ref 0.0–0.7)
Eosinophils Relative: 3 % (ref 0.0–5.0)
HCT: 48.2 % — ABNORMAL HIGH (ref 36.0–46.0)
Hemoglobin: 16.1 g/dL — ABNORMAL HIGH (ref 12.0–15.0)
Lymphocytes Relative: 30.2 % (ref 12.0–46.0)
Lymphs Abs: 1.8 10*3/uL (ref 0.7–4.0)
MCHC: 33.5 g/dL (ref 30.0–36.0)
MCV: 90.9 fl (ref 78.0–100.0)
Monocytes Absolute: 0.6 10*3/uL (ref 0.1–1.0)
Monocytes Relative: 9.8 % (ref 3.0–12.0)
Neutro Abs: 3.3 10*3/uL (ref 1.4–7.7)
Neutrophils Relative %: 56.3 % (ref 43.0–77.0)
Platelets: 262 10*3/uL (ref 150.0–400.0)
RBC: 5.31 Mil/uL — ABNORMAL HIGH (ref 3.87–5.11)
RDW: 13.4 % (ref 11.5–15.5)
WBC: 5.9 10*3/uL (ref 4.0–10.5)

## 2020-09-12 LAB — IBC + FERRITIN
Ferritin: 66.1 ng/mL (ref 10.0–291.0)
Iron: 84 ug/dL (ref 42–145)
Saturation Ratios: 22.3 % (ref 20.0–50.0)
Transferrin: 269 mg/dL (ref 212.0–360.0)

## 2020-09-12 LAB — LIPID PANEL
Cholesterol: 196 mg/dL (ref 0–200)
HDL: 79.4 mg/dL (ref >39.00)
LDL Cholesterol: 87 mg/dL (ref 0–99)
NonHDL: 116.74
Total CHOL/HDL Ratio: 2
Triglycerides: 150 mg/dL — ABNORMAL HIGH (ref 0.0–149.0)
VLDL: 30 mg/dL (ref 0.0–40.0)

## 2020-09-12 LAB — B12 AND FOLATE PANEL
Folate: 13.6 ng/mL (ref >5.9)
Vitamin B-12: 266 pg/mL (ref 211–911)

## 2020-09-12 MED ORDER — PRAVASTATIN SODIUM 10 MG PO TABS: 10.0000 mg | ORAL_TABLET | Freq: Every evening | ORAL | 3 refills | Status: DC

## 2020-09-12 NOTE — Progress Notes (Signed)
Subjective:    Patient ID: Abigail Willis, female    DOB: 1946-04-10, 75 y.o.   MRN: [id number]  CC: Abigail Willis is a 75 y.o. female who presents today for follow up.   HPI: Complains of BLE edema intermittent for 2 -3 months. Worse at end of day. None today.  No orthopnea, sob, calf pain. Extremities are not cold. No numbness or pain with walking. She notes varicosities posterior calves.   HLD- compliant with pravastatin  Osteoporosis- she is not taking vit D; she is unable to tolerate calcium PO.    Adrenal hyperplasia- she has an appointment with Dr Loanne Drilling  Erthrocytosis- declines OSA evaluation      HISTORY:  Past Medical History:  Diagnosis Date  . Allergy    SINUS  . Colon polyps 11/14/2013   Tubular adenoma, hyperplastic polyps  . Diverticulosis 11/15/2010   Moderate  . External hemorrhoids   . Hyperlipidemia   . IBS (irritable bowel syndrome)   . Squamous cell carcinoma in situ   . Vertigo    Past Surgical History:  Procedure Laterality Date  . BREAST BIOPSY    . COLONOSCOPY    . GANGLION CYST EXCISION    . HAMMER TOE SURGERY    . HEMORRHOID BANDING    . LEEP      twice, in [place]  . MOHS SURGERY  2019  . TONSILLECTOMY    . TUBAL LIGATION     Family History  Problem Relation Age of Onset  . Heart attack Mother 71  . Ovarian cancer Mother   . Asthma Father   . Brain cancer Sister   . Colon cancer Cousin   . Breast cancer Neg Hx     Allergies: Avelox [moxifloxacin hcl in nacl] and Penicillins Current Outpatient Medications on File Prior to Visit  Medication Sig Dispense Refill  . acetaminophen (TYLENOL) 500 MG tablet Take 500 mg by mouth as needed.    Marland Kitchen aspirin 81 MG tablet Take 81 mg by mouth daily.    . hyoscyamine (LEVBID) 0.375 MG 12 hr tablet Take 1 tablet (0.375 mg total) by mouth every 12 (twelve) hours as needed. 60 tablet 2  . ibuprofen (ADVIL,MOTRIN) 200 MG tablet Take 200 mg by mouth as needed.    .  pseudoephedrine-guaifenesin (MUCINEX D) 60-600 MG per tablet Take 1 tablet by mouth every 12 (twelve) hours as needed.    . sodium chloride (OCEAN) 0.65 % SOLN nasal spray Place 1 spray into both nostrils as needed for congestion.     No current facility-administered medications on file prior to visit.    Social History   Tobacco Use  . Smoking status: Current Every Day Smoker    Packs/day: 1.00    Years: 52.00    Pack years: 52.00    Types: Cigarettes  . Smokeless tobacco: Never Used  Vaping Use  . Vaping Use: Never used  Substance Use Topics  . Alcohol use: Yes    Alcohol/week: 1.0 standard drink    Types: 1 Glasses of wine per week    Comment: SOCIAL  . Drug use: No    Review of Systems  Constitutional: Negative for chills and fever.  Respiratory: Negative for cough.   Cardiovascular: Positive for leg swelling. Negative for chest pain and palpitations.  Gastrointestinal: Negative for nausea and vomiting.      Objective:    BP 128/72   Pulse 79   Temp 97.6 F (36.4 C) (Oral)  Ht 5' 0.5" (1.537 m)   Wt 151 lb (68.5 kg)   SpO2 97%   BMI 29.00 kg/m  BP Readings from Last 3 Encounters:  09/12/20 128/72  02/21/20 132/62  01/17/20 122/68   Wt Readings from Last 3 Encounters:  09/12/20 151 lb (68.5 kg)  02/21/20 151 lb 12.8 oz (68.9 kg)  01/17/20 152 lb (68.9 kg)    Physical Exam Vitals reviewed.  Constitutional:      Appearance: She is well-developed.  Eyes:     Conjunctiva/sclera: Conjunctivae normal.  Cardiovascular:     Rate and Rhythm: Normal rate and regular rhythm.     Pulses: Normal pulses.     Heart sounds: Normal heart sounds.     Comments: No LE edema, palpable cords or masses. No erythema or increased warmth. No asymmetry in calf size when compared bilaterally LE hair growth symmetric and present. nontender varicosities noted. LE warm and palpable pedal pulses.  Pulmonary:     Effort: Pulmonary effort is normal.     Breath sounds: Normal  breath sounds. No wheezing, rhonchi or rales.  Skin:    General: Skin is warm and dry.  Neurological:     Mental Status: She is alert.  Psychiatric:        Speech: Speech normal.        Behavior: Behavior normal.        Thought Content: Thought content normal.        Assessment & Plan:   Problem List Items Addressed This Visit      Musculoskeletal and Integument   Osteoporosis    Declines fosamax. Encouraged optimization and focus in regards to food sources of vit d and calcium since she is not on supplement.         Other   Bilateral leg edema    No edema today. Discussed low salt, compression stockings. She will let me know if worsens.       HLD (hyperlipidemia) - Primary    Presume controlled. Pending lipid panel. Continue pravastatin 10mg       Relevant Orders   B12 and Folate Panel   IBC + Ferritin   CBC with Differential/Platelet   Lipid panel       I am having Abigail Willis maintain her aspirin, ibuprofen, pseudoephedrine-guaifenesin, acetaminophen, sodium chloride, and hyoscyamine.   No orders of the defined types were placed in this encounter.   Return precautions given.   Risks, benefits, and alternatives of the medications and treatment plan prescribed today were discussed, and patient expressed understanding.   Education regarding symptom management and diagnosis given to patient on AVS.  Continue to follow with Burnard Hawthorne, FNP for routine health maintenance.   [place] and I agreed with plan.   Mable Paris, FNP

## 2020-09-12 NOTE — Assessment & Plan Note (Signed)
Declines fosamax. Encouraged optimization and focus in regards to food sources of vit d and calcium since she is not on supplement.

## 2020-09-12 NOTE — Assessment & Plan Note (Addendum)
Presume controlled. Pending lipid panel. Continue pravastatin 10mg 

## 2020-09-12 NOTE — Assessment & Plan Note (Signed)
No edema today. Discussed low salt, compression stockings. She will let me know if worsens.

## 2020-09-13 ENCOUNTER — Encounter: Payer: Self-pay | Admitting: Endocrinology

## 2020-09-13 ENCOUNTER — Ambulatory Visit: Payer: Medicare Other | Admitting: Endocrinology

## 2020-09-13 DIAGNOSIS — E278 Other specified disorders of adrenal gland: Secondary | ICD-10-CM | POA: Diagnosis not present

## 2020-09-13 LAB — CORTISOL
Cortisol, Plasma: 11.9 ug/dL
Cortisol, Plasma: 38.7 ug/dL

## 2020-09-13 MED ORDER — COSYNTROPIN 0.25 MG IJ SOLR
0.2500 mg | Freq: Once | INTRAMUSCULAR | Status: AC
  Administered 2020-09-13: 0.25 mg via INTRAVENOUS

## 2020-09-13 MED ORDER — DEXAMETHASONE 1 MG PO TABS: 1.0000 mg | ORAL_TABLET | ORAL | 0 refills | Status: DC

## 2020-09-13 NOTE — Progress Notes (Signed)
Pt was administered a Cosyntropin injection in the Lt deltoid

## 2020-09-13 NOTE — Progress Notes (Signed)
Subjective:    Patient ID: Abigail Willis, female    DOB: 1945-08-02, 75 y.o.   MRN: [id number]  HPI Pt is referred by Mable Paris, NP, for adrenal enlargement.  Pt has no h/o adrenal problem.  No h/o thyroid problems, seizures, hypoglycemia, amyloidosis, tuberculosis, or diabetes.  No recent steroids.  No h/o ketoconazole, rifampin, or dilantin.  Main sxs are nasal congest and chronic cough.   Past Medical History:  Diagnosis Date  . Allergy    SINUS  . Colon polyps 11/14/2013   Tubular adenoma, hyperplastic polyps  . Diverticulosis 11/15/2010   Moderate  . External hemorrhoids   . Hyperlipidemia   . IBS (irritable bowel syndrome)   . Squamous cell carcinoma in situ   . Vertigo     Past Surgical History:  Procedure Laterality Date  . BREAST BIOPSY    . COLONOSCOPY    . GANGLION CYST EXCISION    . HAMMER TOE SURGERY    . HEMORRHOID BANDING    . LEEP      twice, in [place]  . MOHS SURGERY  2019  . TONSILLECTOMY    . TUBAL LIGATION      Social History   Socioeconomic History  . Marital status: Married    Spouse name: Not on file  . Number of children: 1  . Years of education: Not on file  . Highest education level: Not on file  Occupational History  . Occupation: Retired  Tobacco Use  . Smoking status: Current Every Day Smoker    Packs/day: 1.00    Years: 52.00    Pack years: 52.00    Types: Cigarettes  . Smokeless tobacco: Never Used  Vaping Use  . Vaping Use: Never used  Substance and Sexual Activity  . Alcohol use: Yes    Alcohol/week: 1.0 standard drink    Types: 1 Glasses of wine per week    Comment: SOCIAL  . Drug use: No  . Sexual activity: Yes  Other Topics Concern  . Not on file  Social History Narrative   Moved from [place] to be closer to her daughter, spends winters at [facility] home.      Has HPOA- husband Cletus Gash.   Would desire feeding tube, no prolonged life support.   Not sure about feeding tubes.             Social Determinants of Health   Financial Resource Strain: Low Risk   . Difficulty of Paying Living Expenses: Not hard at all  Food Insecurity: No Food Insecurity  . Worried About Charity fundraiser in the Last Year: Never true  . Ran Out of Food in the Last Year: Never true  Transportation Needs: No Transportation Needs  . Lack of Transportation (Medical): No  . Lack of Transportation (Non-Medical): No  Physical Activity: Not on file  Stress: No Stress Concern Present  . Feeling of Stress : Not at all  Social Connections: Unknown  . Frequency of Communication with Friends and Family: More than three times a week  . Frequency of Social Gatherings with Friends and Family: More than three times a week  . Attends Religious Services: Not on file  . Active Member of Clubs or Organizations: Yes  . Attends Archivist Meetings: More than 4 times per year  . Marital Status: Married  Human resources officer Violence: Not At Risk  . Fear of Current or Ex-Partner: No  . Emotionally Abused: No  .  Physically Abused: No  . Sexually Abused: No    Current Outpatient Medications on File Prior to Visit  Medication Sig Dispense Refill  . acetaminophen (TYLENOL) 500 MG tablet Take 500 mg by mouth as needed.    Marland Kitchen aspirin 81 MG tablet Take 81 mg by mouth daily.    . hyoscyamine (LEVBID) 0.375 MG 12 hr tablet Take 1 tablet (0.375 mg total) by mouth every 12 (twelve) hours as needed. 60 tablet 2  . ibuprofen (ADVIL,MOTRIN) 200 MG tablet Take 200 mg by mouth as needed.    . pravastatin (PRAVACHOL) 10 MG tablet Take 1 tablet (10 mg total) by mouth every evening. 90 tablet 3  . pseudoephedrine-guaifenesin (MUCINEX D) 60-600 MG per tablet Take 1 tablet by mouth every 12 (twelve) hours as needed.    . sodium chloride (OCEAN) 0.65 % SOLN nasal spray Place 1 spray into both nostrils as needed for congestion.     No current facility-administered medications on file prior to visit.    Allergies   Allergen Reactions  . Avelox [Moxifloxacin Hcl In Nacl] Other (See Comments)    Severe stomach cramping and nausea  . Penicillins Rash    Family History  Problem Relation Age of Onset  . Heart attack Mother 63  . Ovarian cancer Mother   . Asthma Father   . Brain cancer Sister   . Colon cancer Cousin   . Breast cancer Neg Hx   . Adrenal disorder Neg Hx     BP 130/74 (BP Location: Right Arm, Patient Position: Sitting, Cuff Size: Large)   Pulse 89   Ht 5' 0.5" (1.537 m)   Wt 151 lb (68.5 kg)   SpO2 94%   BMI 29.00 kg/m    Review of Systems Denies weight loss, fatigue, dizziness, n/v, cold intolerance, vitiligo, and change in skin tone.      Objective:   Physical Exam VS: see vs page GEN: no distress HEAD: head: no deformity eyes: no periorbital swelling, no proptosis external nose and ears are normal NECK: supple, thyroid is not enlarged CHEST WALL: no deformity LUNGS: clear to auscultation CV: reg rate and rhythm, no murmur.  MUSCULOSKELETAL: gait is normal and steady EXTEMITIES: no deformity.  no leg edema NEURO:  readily moves all 4's.  sensation is intact to touch on all 4's.   SKIN:  Normal texture and temperature.  No rash or suspicious lesion is visible.   NODES:  None palpable at the neck PSYCH: alert, well-oriented.  Does not appear anxious nor depressed.     CT: Bilateral low-attenuation enlargement of the adrenal glands compatible with underlying adenomatous hypertrophy.  I have reviewed outside records, and summarized: Pt underwent lung cancer screening CT, and adrenal enlargement was incidentally noted.  She was referred here.  ACTH stimulation test is done: baseline cortisol level=12 then Cosyntropin 250 mcg is given im 45 minutes later, cortisol level=38 (normal response)     Assessment & Plan:  Adrenal enlargement, new to me, uncertain etiology and prognosis.  In view of the fact that it is bilateral, adrenal insuff and excess should both  be excluded.    Patient Instructions  Blood tests are requested for you today.  We'll let you know about the results.  Today's results will probably tell us we should do a 24-HR urine collection for cortisol.  We would then do a "dexamethasone suppression test."  For this, you would take dexamethasone 1 mg at 10 pm (I have sent a prescription to  your pharmacy), then come in for a "cortisol" blood test the next morning before 9 am.  You do not need to be fasting for this test.  Please come back for a follow-up appointment in 4 months.

## 2020-09-13 NOTE — Patient Instructions (Addendum)
Blood tests are requested for you today.  We'll let you know about the results.  Today's results will probably tell us we should do a 24-HR urine collection for cortisol.  We would then do a "dexamethasone suppression test."  For this, you would take dexamethasone 1 mg at 10 pm (I have sent a prescription to your pharmacy), then come in for a "cortisol" blood test the next morning before 9 am.  You do not need to be fasting for this test.  Please come back for a follow-up appointment in 4 months.

## 2020-09-17 ENCOUNTER — Encounter: Payer: Self-pay | Admitting: Endocrinology

## 2020-09-18 ENCOUNTER — Other Ambulatory Visit: Payer: Medicare Other

## 2020-09-18 ENCOUNTER — Encounter: Payer: Self-pay | Admitting: Family

## 2020-09-18 ENCOUNTER — Other Ambulatory Visit: Payer: Self-pay

## 2020-09-18 ENCOUNTER — Other Ambulatory Visit: Payer: Self-pay | Admitting: Family

## 2020-09-18 DIAGNOSIS — E538 Deficiency of other specified B group vitamins: Secondary | ICD-10-CM

## 2020-09-18 DIAGNOSIS — E278 Other specified disorders of adrenal gland: Secondary | ICD-10-CM

## 2020-09-18 DIAGNOSIS — R899 Unspecified abnormal finding in specimens from other organs, systems and tissues: Secondary | ICD-10-CM

## 2020-09-18 LAB — ACTH: C206 ACTH: <5 pg/mL — ABNORMAL LOW (ref 6–50)

## 2020-09-18 NOTE — Addendum Note (Signed)
Addended by: Leeanne Rio on: 09/18/2020 10:52 AM   Modules accepted: Orders

## 2020-09-18 NOTE — Addendum Note (Signed)
Addended by: Neta Ehlers on: 09/18/2020 10:42 AM   Modules accepted: Orders

## 2020-09-19 ENCOUNTER — Ambulatory Visit (INDEPENDENT_AMBULATORY_CARE_PROVIDER_SITE_OTHER): Payer: Medicare Other

## 2020-09-19 ENCOUNTER — Other Ambulatory Visit (INDEPENDENT_AMBULATORY_CARE_PROVIDER_SITE_OTHER): Payer: Medicare Other

## 2020-09-19 DIAGNOSIS — E538 Deficiency of other specified B group vitamins: Secondary | ICD-10-CM | POA: Diagnosis not present

## 2020-09-19 LAB — B12 AND FOLATE PANEL
Folate: 14.1 ng/mL (ref >5.9)
Vitamin B-12: 251 pg/mL (ref 211–911)

## 2020-09-19 MED ORDER — CYANOCOBALAMIN 1000 MCG/ML IJ SOLN
1000.0000 ug | Freq: Once | INTRAMUSCULAR | Status: AC
  Administered 2020-09-19: 1000 ug via INTRAMUSCULAR

## 2020-09-19 NOTE — Progress Notes (Signed)
Patient presented for B 12 injection to left deltoid, patient voiced no concerns nor showed any signs of distress during injection. 

## 2020-09-24 LAB — ANTI-PARIETAL ANTIBODY: PARIETAL CELL AB SCREEN: NEGATIVE

## 2020-09-24 LAB — CORTISOL, URINE, 24 HOUR
24 Hour urine volume (VMAHVA): 1300 mL
CREATININE, URINE: 0.47 g/(24.h) — ABNORMAL LOW (ref 0.50–2.15)
Cortisol (Ur), Free: 15.9 mcg/24 h (ref 4.0–50.0)

## 2020-09-24 LAB — HOMOCYSTEINE: Homocysteine: 9.9 umol/L (ref <10.4)

## 2020-09-24 LAB — METHYLMALONIC ACID, SERUM: Methylmalonic Acid, Quant: 173 nmol/L (ref 87–318)

## 2020-09-24 LAB — INTRINSIC FACTOR ANTIBODIES: Intrinsic Factor: NEGATIVE

## 2020-09-25 ENCOUNTER — Inpatient Hospital Stay: Payer: Medicare Other

## 2020-09-25 ENCOUNTER — Inpatient Hospital Stay: Payer: Medicare Other | Attending: Oncology | Admitting: Oncology

## 2020-09-25 ENCOUNTER — Other Ambulatory Visit: Payer: Self-pay

## 2020-09-25 ENCOUNTER — Encounter: Payer: Self-pay | Admitting: Oncology

## 2020-09-25 VITALS — BP 125/79 | HR 90 | Temp 98.7°F | Resp 16 | Ht 60.6 in | Wt 152.4 lb

## 2020-09-25 DIAGNOSIS — K589 Irritable bowel syndrome without diarrhea: Secondary | ICD-10-CM | POA: Insufficient documentation

## 2020-09-25 DIAGNOSIS — Z86010 Personal history of colonic polyps: Secondary | ICD-10-CM | POA: Diagnosis not present

## 2020-09-25 DIAGNOSIS — E785 Hyperlipidemia, unspecified: Secondary | ICD-10-CM | POA: Insufficient documentation

## 2020-09-25 DIAGNOSIS — Z8719 Personal history of other diseases of the digestive system: Secondary | ICD-10-CM | POA: Insufficient documentation

## 2020-09-25 DIAGNOSIS — F17210 Nicotine dependence, cigarettes, uncomplicated: Secondary | ICD-10-CM | POA: Insufficient documentation

## 2020-09-25 DIAGNOSIS — I2510 Atherosclerotic heart disease of native coronary artery without angina pectoris: Secondary | ICD-10-CM | POA: Insufficient documentation

## 2020-09-25 DIAGNOSIS — D751 Secondary polycythemia: Secondary | ICD-10-CM

## 2020-09-25 DIAGNOSIS — I700 Atherosclerosis of aorta: Secondary | ICD-10-CM | POA: Diagnosis not present

## 2020-09-25 DIAGNOSIS — M810 Age-related osteoporosis without current pathological fracture: Secondary | ICD-10-CM | POA: Diagnosis not present

## 2020-09-25 DIAGNOSIS — Z7982 Long term (current) use of aspirin: Secondary | ICD-10-CM | POA: Insufficient documentation

## 2020-09-25 DIAGNOSIS — J439 Emphysema, unspecified: Secondary | ICD-10-CM | POA: Diagnosis not present

## 2020-09-25 LAB — URINALYSIS, COMPLETE (UACMP) WITH MICROSCOPIC
Bacteria, UA: NONE SEEN
Bilirubin Urine: NEGATIVE
Glucose, UA: NEGATIVE mg/dL
Ketones, ur: NEGATIVE mg/dL
Leukocytes,Ua: NEGATIVE
Nitrite: NEGATIVE
Protein, ur: NEGATIVE mg/dL
Specific Gravity, Urine: 1.008 (ref 1.005–1.030)
pH: 6 (ref 5.0–8.0)

## 2020-09-25 LAB — CBC WITH DIFFERENTIAL/PLATELET
Abs Immature Granulocytes: 0.03 10*3/uL (ref 0.00–0.07)
Basophils Absolute: 0 10*3/uL (ref 0.0–0.1)
Basophils Relative: 1 %
Eosinophils Absolute: 0.1 10*3/uL (ref 0.0–0.5)
Eosinophils Relative: 1 %
HCT: 48.9 % — ABNORMAL HIGH (ref 36.0–46.0)
Hemoglobin: 16.1 g/dL — ABNORMAL HIGH (ref 12.0–15.0)
Immature Granulocytes: 0 %
Lymphocytes Relative: 25 %
Lymphs Abs: 2.2 10*3/uL (ref 0.7–4.0)
MCH: 30.4 pg (ref 26.0–34.0)
MCHC: 32.9 g/dL (ref 30.0–36.0)
MCV: 92.4 fL (ref 80.0–100.0)
Monocytes Absolute: 0.7 10*3/uL (ref 0.1–1.0)
Monocytes Relative: 9 %
Neutro Abs: 5.6 10*3/uL (ref 1.7–7.7)
Neutrophils Relative %: 64 %
Platelets: 264 10*3/uL (ref 150–400)
RBC: 5.29 MIL/uL — ABNORMAL HIGH (ref 3.87–5.11)
RDW: 13.1 % (ref 11.5–15.5)
WBC: 8.6 10*3/uL (ref 4.0–10.5)
nRBC: 0 % (ref 0.0–0.2)

## 2020-09-25 NOTE — Progress Notes (Signed)
Hematology/Oncology Consult note [facility] Telephone:([phone] [phone]  Patient Care Team: Burnard Hawthorne, FNP as PCP - General (Family Medicine) Milus Banister, MD (Gastroenterology) Leandrew Koyanagi, MD (Ophthalmology) Angelique Blonder, DO (Dermatology)   Name of the patient: Abigail Willis  [id number]  1946/04/09    Reason for referral-polycythemia   Referring [place], FNP  Date of visit: 09/25/20   History of presenting illness-patient is a 75 year old female with a past medical history significant for CAD hyperlipidemia and smoking referred for polycythemia.  Her most recent CBC from4/27/2022 which showed H&H of 16.1/48.2 with a normal white count and platelet count.  Of note her hemoglobin has been between 15-16 at least for the last 3 years.  No significant upward trend during this time.  Patient reports ongoing fatigue.  Denies any unintentional weight loss denies any blood in her urine  ECOG PS- 1  Pain scale- 0   Review of systems- Review of Systems  Constitutional: Positive for malaise/fatigue. Negative for chills, fever and weight loss.  HENT: Negative for congestion, ear discharge and nosebleeds.   Eyes: Negative for blurred vision.  Respiratory: Negative for cough, hemoptysis, sputum production, shortness of breath and wheezing.   Cardiovascular: Negative for chest pain, palpitations, orthopnea and claudication.  Gastrointestinal: Negative for abdominal pain, blood in stool, constipation, diarrhea, heartburn, melena, nausea and vomiting.  Genitourinary: Negative for dysuria, flank pain, frequency, hematuria and urgency.  Musculoskeletal: Negative for back pain, joint pain and myalgias.  Skin: Negative for rash.  Neurological: Negative for dizziness, tingling, focal weakness, seizures, weakness and headaches.  Endo/Heme/Allergies: Does not bruise/bleed easily.  Psychiatric/Behavioral: Negative  for depression and suicidal ideas. The patient does not have insomnia.     Allergies  Allergen Reactions  . Avelox [Moxifloxacin Hcl In Nacl] Other (See Comments)    Severe stomach cramping and nausea  . Penicillins Rash    Patient Active Problem List   Diagnosis Date Noted  . Adrenal hyperplasia ([place]) 09/13/2020  . Screening for AAA (abdominal aortic aneurysm) 02/21/2020  . Routine physical examination 02/21/2020  . Encounter for long-term (current) aspirin use 02/21/2020  . 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 Osteoporosis 02/21/2020  . Numbness and tingling in right hand 11/29/2019  . Elevated hemoglobin ([place]) 11/29/2019  . Pelvic floor dysfunction in female 02/23/2019  . Coronary artery calcification 12/31/2018  . Coronary atherosclerosis 12/29/2018  . Aortic atherosclerosis ([place]) 12/28/2018  . Emphysema of lung ([place]) 12/28/2018  . Chest pain 12/28/2018  . Encounter for examination following treatment at hospital 12/28/2018  . Hyperlipidemia LDL goal <70   . Tobacco abuse   . Fatigue 11/22/2018  . Internal and external bleeding hemorrhoids 10/03/2016  . Perianal dermatitis 10/03/2016  . IBS (irritable bowel syndrome) 11/27/2015  . Hyperglycemia 11/21/2013  . Bilateral leg edema 02/14/2013  . HLD (hyperlipidemia) 03/06/2010  . Allergic rhinitis 03/06/2010     Past Medical History:  Diagnosis Date  . Allergy    SINUS  . Colon polyps 11/14/2013   Tubular adenoma, hyperplastic polyps  . Diverticulosis 11/15/2010   Moderate  . External hemorrhoids   . Hyperlipidemia   . IBS (irritable bowel syndrome)   . Squamous cell carcinoma in situ   . Vertigo      Past Surgical History:  Procedure Laterality Date  . BREAST BIOPSY    . COLONOSCOPY    . GANGLION CYST EXCISION    . HAMMER TOE SURGERY    . HEMORRHOID BANDING    .  LEEP      twice, in [place]  . MOHS SURGERY  2019  . TONSILLECTOMY    . TUBAL LIGATION      Social History   Socioeconomic History  .  Marital status: Married    Spouse name: Not on file  . Number of children: 1  . Years of education: Not on file  . Highest education level: Not on file  Occupational History  . Occupation: Retired  Tobacco Use  . Smoking status: Current Every Day Smoker    Packs/day: 1.00    Years: 52.00    Pack years: 52.00    Types: Cigarettes  . Smokeless tobacco: Never Used  Vaping Use  . Vaping Use: Never used  Substance and Sexual Activity  . Alcohol use: Yes    Alcohol/week: 7.0 standard drinks    Types: 7 Glasses of wine per week    Comment: SOCIAL  . Drug use: No  . Sexual activity: Yes  Other Topics Concern  . Not on file  Social History Narrative   Moved from [place] to be closer to her daughter, spends winters at [facility] home.      Has HPOA- husband Cletus Gash.   Would desire feeding tube, no prolonged life support.   Not sure about feeding tubes.            Social Determinants of Health   Financial Resource Strain: Low Risk   . Difficulty of Paying Living Expenses: Not hard at all  Food Insecurity: No Food Insecurity  . Worried About Charity fundraiser in the Last Year: Never true  . Ran Out of Food in the Last Year: Never true  Transportation Needs: No Transportation Needs  . Lack of Transportation (Medical): No  . Lack of Transportation (Non-Medical): No  Physical Activity: Not on file  Stress: No Stress Concern Present  . Feeling of Stress : Not at all  Social Connections: Unknown  . Frequency of Communication with Friends and Family: More than three times a week  . Frequency of Social Gatherings with Friends and Family: More than three times a week  . Attends Religious Services: Not on file  . Active Member of Clubs or Organizations: Yes  . Attends Archivist Meetings: More than 4 times per year  . Marital Status: Married  Human resources officer Violence: Not At Risk  . Fear of Current or Ex-Partner: No  . Emotionally Abused: No  . Physically  Abused: No  . Sexually Abused: No     Family History  Problem Relation Age of Onset  . Heart attack Mother 65  . Ovarian cancer Mother   . Asthma Father   . Brain cancer Sister   . Colon cancer Cousin   . Breast cancer Neg Hx   . Adrenal disorder Neg Hx      Current Outpatient Medications:  .  acetaminophen (TYLENOL) 500 MG tablet, Take 500 mg by mouth as needed., Disp: , Rfl:  .  aspirin 81 MG tablet, Take 81 mg by mouth daily., Disp: , Rfl:  .  cyanocobalamin (,VITAMIN B-12,) 1000 MCG/ML injection, Inject 1,000 mcg into the muscle once., Disp: , Rfl:  .  hyoscyamine (LEVBID) 0.375 MG 12 hr tablet, Take 1 tablet (0.375 mg total) by mouth every 12 (twelve) hours as needed., Disp: 60 tablet, Rfl: 2 .  ibuprofen (ADVIL,MOTRIN) 200 MG tablet, Take 200 mg by mouth as needed., Disp: , Rfl:  .  pravastatin (PRAVACHOL) 10 MG  tablet, Take 1 tablet (10 mg total) by mouth every evening., Disp: 90 tablet, Rfl: 3 .  pseudoephedrine-guaifenesin (MUCINEX D) 60-600 MG per tablet, Take 1 tablet by mouth every 12 (twelve) hours as needed., Disp: , Rfl:  .  sodium chloride (OCEAN) 0.65 % SOLN nasal spray, Place 1 spray into both nostrils as needed for congestion., Disp: , Rfl:    Physical exam:  Vitals:   09/25/20 1132  BP: 125/79  Pulse: 90  Resp: 16  Temp: 98.7 F (37.1 C)  TempSrc: Oral  Weight: 152 lb 6.4 oz (69.1 kg)  Height: 5' 0.6" (1.539 m)   Physical Exam Constitutional:      General: She is not in acute distress. Cardiovascular:     Rate and Rhythm: Normal rate and regular rhythm.     Heart sounds: Normal heart sounds.  Pulmonary:     Effort: Pulmonary effort is normal.     Breath sounds: Normal breath sounds.  Abdominal:     General: Bowel sounds are normal.     Palpations: Abdomen is soft.     Comments: No palpable hepatosplenomegaly  Skin:    General: Skin is warm and dry.  Neurological:     Mental Status: She is alert and oriented to person, place, and time.         CMP Latest Ref Rng & Units 11/29/2019  Glucose 70 - 99 mg/dL 101(H)  BUN 6 - 23 mg/dL 9  Creatinine 0.40 - 1.20 mg/dL 0.57  Sodium 135 - 145 mEq/L 139  Potassium 3.5 - 5.1 mEq/L 4.2  Chloride 96 - 112 mEq/L 104  CO2 19 - 32 mEq/L 28  Calcium 8.4 - 10.5 mg/dL 9.3  Total Protein 6.0 - 8.3 g/dL 6.9  Total Bilirubin 0.2 - 1.2 mg/dL 0.4  Alkaline Phos 39 - 117 U/L 70  AST 0 - 37 U/L 16  ALT 0 - 35 U/L 11   CBC Latest Ref Rng & Units 09/25/2020  WBC 4.0 - 10.5 K/uL 8.6  Hemoglobin 12.0 - 15.0 g/dL 16.1(H)  Hematocrit 36.0 - 46.0 % 48.9(H)  Platelets 150 - 400 K/uL 264    Assessment and plan- Patient is a 75 y.o. female referred for polycythemia likely secondary to smoking  Discussed differences between polycythemia vera and secondary polycythemia.  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  Her hemoglobin has been stable between 15-16 for the last 3 years without a clear uptrend.  We will check a CBC with differential, JAK2 mutation, EPO levels and Urinalysis.  I will see her back in 2 weeks for a video visit to discuss the results of her blood work.  Discussed the results of thromboembolic episodes associated with polycythemia vera.  The risk of thromboembolism with secondary polycythemia is not typically high and phlebotomy only needs to be considered if her hematocrit is greater than 55   Thank you for this kind referral and the opportunity to participate in the care of this patient   Visit Diagnosis 1. Polycythemia     Dr. Randa Evens, MD, MPH [facility] [phone] 09/25/2020

## 2020-09-25 NOTE — Progress Notes (Signed)
Pt here for abnormal lab from PCP. Does not feel like she has symptoms. But her PCP did start her on b12 inj. Weekly x 4 and then once monthly

## 2020-09-26 ENCOUNTER — Ambulatory Visit (INDEPENDENT_AMBULATORY_CARE_PROVIDER_SITE_OTHER): Payer: Medicare Other

## 2020-09-26 ENCOUNTER — Other Ambulatory Visit: Payer: Self-pay

## 2020-09-26 DIAGNOSIS — E538 Deficiency of other specified B group vitamins: Secondary | ICD-10-CM | POA: Diagnosis not present

## 2020-09-26 LAB — ERYTHROPOIETIN: Erythropoietin: 6.1 m[IU]/mL (ref 2.6–18.5)

## 2020-09-26 MED ORDER — CYANOCOBALAMIN 1000 MCG/ML IJ SOLN
1000.0000 ug | Freq: Once | INTRAMUSCULAR | Status: AC
  Administered 2020-09-26: 1000 ug via INTRAMUSCULAR

## 2020-09-26 NOTE — Progress Notes (Signed)
Patient presented for B 12 injection to left deltoid, patient voiced no concerns nor showed any signs of distress during injection. 

## 2020-09-28 ENCOUNTER — Other Ambulatory Visit: Payer: Self-pay | Admitting: Endocrinology

## 2020-09-28 DIAGNOSIS — E278 Other specified disorders of adrenal gland: Secondary | ICD-10-CM

## 2020-09-28 MED ORDER — DEXAMETHASONE 1 MG PO TABS
1.0000 mg | ORAL_TABLET | ORAL | 0 refills | Status: DC
Start: 2020-09-28 — End: 2020-10-10

## 2020-10-01 ENCOUNTER — Encounter: Payer: Self-pay | Admitting: Family

## 2020-10-03 ENCOUNTER — Ambulatory Visit: Payer: Medicare Other

## 2020-10-04 LAB — JAK2 GENOTYPR

## 2020-10-08 ENCOUNTER — Ambulatory Visit (INDEPENDENT_AMBULATORY_CARE_PROVIDER_SITE_OTHER): Payer: Medicare Other

## 2020-10-08 ENCOUNTER — Other Ambulatory Visit: Payer: Self-pay

## 2020-10-08 ENCOUNTER — Encounter: Payer: Self-pay | Admitting: Family

## 2020-10-08 ENCOUNTER — Other Ambulatory Visit (INDEPENDENT_AMBULATORY_CARE_PROVIDER_SITE_OTHER): Payer: Medicare Other

## 2020-10-08 DIAGNOSIS — E538 Deficiency of other specified B group vitamins: Secondary | ICD-10-CM | POA: Diagnosis not present

## 2020-10-08 DIAGNOSIS — E278 Other specified disorders of adrenal gland: Secondary | ICD-10-CM | POA: Diagnosis not present

## 2020-10-08 LAB — CORTISOL: Cortisol, Plasma: 3.1 ug/dL

## 2020-10-08 MED ORDER — CYANOCOBALAMIN 1000 MCG/ML IJ SOLN
1000.0000 ug | Freq: Once | INTRAMUSCULAR | Status: AC
  Administered 2020-10-08: 1000 ug via INTRAMUSCULAR

## 2020-10-08 MED ORDER — CYANOCOBALAMIN 1000 MCG/ML IJ SOLN: 1000.0000 ug | Freq: Once | INTRAMUSCULAR | Status: DC

## 2020-10-08 NOTE — Progress Notes (Signed)
Patient presented for B 12 injection to left deltoid, patient voiced no concerns nor showed any signs of distress during injection. 

## 2020-10-10 ENCOUNTER — Other Ambulatory Visit: Payer: Self-pay

## 2020-10-10 ENCOUNTER — Encounter: Payer: Self-pay | Admitting: Gastroenterology

## 2020-10-10 ENCOUNTER — Ambulatory Visit: Payer: Medicare Other | Admitting: Gastroenterology

## 2020-10-10 ENCOUNTER — Ambulatory Visit: Payer: Medicare Other

## 2020-10-10 ENCOUNTER — Inpatient Hospital Stay (HOSPITAL_BASED_OUTPATIENT_CLINIC_OR_DEPARTMENT_OTHER): Payer: Medicare Other | Admitting: Oncology

## 2020-10-10 VITALS — Wt 152.0 lb

## 2020-10-10 DIAGNOSIS — Z86010 Personal history of colonic polyps: Secondary | ICD-10-CM

## 2020-10-10 DIAGNOSIS — K625 Hemorrhage of anus and rectum: Secondary | ICD-10-CM

## 2020-10-10 DIAGNOSIS — D751 Secondary polycythemia: Secondary | ICD-10-CM | POA: Diagnosis not present

## 2020-10-10 MED ORDER — LOPERAMIDE HCL 2 MG PO TABS: 2.0000 mg | ORAL_TABLET | Freq: Every morning | ORAL | 0 refills | Status: AC

## 2020-10-10 NOTE — Progress Notes (Signed)
Review of pertinent gastrointestinal problems: 1. Adenomatous polyps:  Colonoscopy 10/2010 Dr. Ardis Hughs (for previous adenomatous polyps in [facility] including a 1cm pedunculated polyp in 1998) 4 subCM polyps removed, one was adenomatous, She was recommended to have repeat colonoscopy at 5 year interval.    Colonoscopy September 2017 Dr. Ardis Hughs found 3 subcentimeter polyps.  Also internal and external hemorrhoids.  2 of these polyps were sessile serrated adenomas, the other was hyperplastic. 2. IBS: with abd cramping; probiotics and PRN levbid has helped. 3.  Internal and external hemorrhoids: Internal Hemorrhoid banding procedures May 2018.   HPI: This is a very pleasant 75 year old woman whom I last saw almost 5 years ago at the time of colonoscopy.  See those results summarized above  She has rectal bleeding on a daily basis.  This is a small amount of blood on tissue every time she moves her bowels which is about 5-6 times per day.  Her stools are usually soft to keep formed.  She will go 4 times before she even has breakfast in the morning.  This has been her pattern for many many years.  She has tried Benefiber in the past and that did not help her bowel habits.  She has tried align for several years and that did not help either.  A maternal cousin had colon cancer  Her weight has been overall stable    Old Data Reviewed:  Blood work May 2022 hemoglobin 16.1, normal platelets   Review of systems: Pertinent positive and negative review of systems were noted in the above HPI section. All other review negative.   Past Medical History:  Diagnosis Date  . Allergy    SINUS  . Colon polyps 11/14/2013   Tubular adenoma, hyperplastic polyps  . Diverticulosis 11/15/2010   Moderate  . External hemorrhoids   . Hyperlipidemia   . IBS (irritable bowel syndrome)   . Squamous cell carcinoma in situ   . Vertigo     Past Surgical History:  Procedure Laterality Date  . BREAST BIOPSY     . COLONOSCOPY    . GANGLION CYST EXCISION    . HAMMER TOE SURGERY    . HEMORRHOID BANDING    . LEEP      twice, in [place]  . MOHS SURGERY  2019  . TONSILLECTOMY    . TUBAL LIGATION      Current Outpatient Medications  Medication Sig Dispense Refill  . acetaminophen (TYLENOL) 500 MG tablet Take 500 mg by mouth as needed.    Marland Kitchen aspirin 81 MG tablet Take 81 mg by mouth daily.    . cyanocobalamin (,VITAMIN B-12,) 1000 MCG/ML injection Inject 1,000 mcg into the muscle once.    . hyoscyamine (LEVBID) 0.375 MG 12 hr tablet Take 1 tablet (0.375 mg total) by mouth every 12 (twelve) hours as needed. 60 tablet 2  . ibuprofen (ADVIL,MOTRIN) 200 MG tablet Take 200 mg by mouth as needed.    . pravastatin (PRAVACHOL) 10 MG tablet Take 1 tablet (10 mg total) by mouth every evening. 90 tablet 3  . pseudoephedrine-guaifenesin (MUCINEX D) 60-600 MG per tablet Take 1 tablet by mouth every 12 (twelve) hours as needed.    . sodium chloride (OCEAN) 0.65 % SOLN nasal spray Place 1 spray into both nostrils as needed for congestion.     No current facility-administered medications for this visit.    Allergies as of 10/10/2020 - Review Complete 10/10/2020  Allergen Reaction Noted  . Avelox [moxifloxacin hcl in nacl]  Other (See Comments) 03/16/2012  . Penicillins Rash 03/06/2010    Family History  Problem Relation Age of Onset  . Heart attack Mother 53  . Ovarian cancer Mother   . Asthma Father   . Brain cancer Sister   . Colon cancer Cousin   . Breast cancer Neg Hx   . Adrenal disorder Neg Hx     Social History   Socioeconomic History  . Marital status: Married    Spouse name: Not on file  . Number of children: 1  . Years of education: Not on file  . Highest education level: Not on file  Occupational History  . Occupation: Retired  Tobacco Use  . Smoking status: Current Every Day Smoker    Packs/day: 1.00    Years: 52.00    Pack years: 52.00    Types: Cigarettes  . Smokeless tobacco:  Never Used  Vaping Use  . Vaping Use: Never used  Substance and Sexual Activity  . Alcohol use: Yes    Alcohol/week: 7.0 standard drinks    Types: 7 Glasses of wine per week    Comment: SOCIAL  . Drug use: No  . Sexual activity: Yes  Other Topics Concern  . Not on file  Social History Narrative   Moved from [place] to be closer to her daughter, spends winters at [facility] home.      Has HPOA- husband Cletus Gash.   Would desire feeding tube, no prolonged life support.   Not sure about feeding tubes.            Social Determinants of Health   Financial Resource Strain: Low Risk   . Difficulty of Paying Living Expenses: Not hard at all  Food Insecurity: No Food Insecurity  . Worried About Charity fundraiser in the Last Year: Never true  . Ran Out of Food in the Last Year: Never true  Transportation Needs: No Transportation Needs  . Lack of Transportation (Medical): No  . Lack of Transportation (Non-Medical): No  Physical Activity: Not on file  Stress: No Stress Concern Present  . Feeling of Stress : Not at all  Social Connections: Unknown  . Frequency of Communication with Friends and Family: More than three times a week  . Frequency of Social Gatherings with Friends and Family: More than three times a week  . Attends Religious Services: Not on file  . Active Member of Clubs or Organizations: Yes  . Attends Archivist Meetings: More than 4 times per year  . Marital Status: Married  Human resources officer Violence: Not At Risk  . Fear of Current or Ex-Partner: No  . Emotionally Abused: No  . Physically Abused: No  . Sexually Abused: No     Physical Exam: BP 130/68   Pulse 79   Ht 5\' 5"  (1.651 m)   Wt 152 lb 12.8 oz (69.3 kg)   BMI 25.43 kg/m  Constitutional: generally well-appearing Psychiatric: alert and oriented x3 Eyes: extraocular movements intact Mouth: oral pharynx moist, no lesions Neck: supple no lymphadenopathy Cardiovascular: heart regular  rate and rhythm Lungs: clear to auscultation bilaterally Abdomen: soft, nontender, nondistended, no obvious ascites, no peritoneal signs, normal bowel sounds Extremities: no lower extremity edema bilaterally Skin: no lesions on visible extremities Rectal exam deferred for upcoming colonoscopy  Assessment and plan: 75 y.o. female with personal history of precancerous colon polyps, minor intermittent rectal bleeding, chronic frequent stools  She has known internal and external hemorrhoids and I suspect that  those are the source of her minor intermittent rectal bleeding on the tissue paper.  She is not anemic.  She is "due" for repeat colonoscopy later this year and would like to have that done sooner and I think it is certainly reasonable given her ongoing bleeding.  I recommended a trial of scheduled early morning Imodium 1 pill every day until then.  I think that might go quite a long way to help ease her frequency of stools.   Please see the "Patient Instructions" section for addition details about the plan.   Owens Loffler, MD [facility] 10/10/2020, 8:41 AM  Cc: Burnard Hawthorne, FNP  Total time on date of encounter was 45  minutes (this included time spent preparing to see the patient reviewing records; obtaining and/or reviewing separately obtained history; performing a medically appropriate exam and/or evaluation; counseling and educating the patient and family if present; ordering medications, tests or procedures if applicable; and documenting clinical information in the health record).

## 2020-10-10 NOTE — Patient Instructions (Signed)
If you are age 76 or older, your body mass index should be between 23-30. Your Body mass index is 25.43 kg/m. If this is out of the aforementioned range listed, please consider follow up with your Primary Care Provider.  You have been scheduled for a colonoscopy. Please follow written instructions given to you at your visit today.  Please pick up your prep supplies at the pharmacy within the next 1-3 days. If you use inhalers (even only as needed), please bring them with you on the day of your procedure.  Please purchase the following medications over the counter and take as directed:  Imodium take one tablet every morning.  The [facility] providers would like to encourage you to use [facility] to communicate with providers for non-urgent requests or questions.  Due to long hold times on the telephone, sending your provider a message by [facility] may be a faster and more efficient way to get a response.  Please allow 48 business hours for a response.  Please remember that this is for non-urgent requests.   Due to recent changes in healthcare laws, you may see the results of your imaging and laboratory studies on MyChart before your provider has had a chance to review them.  We understand that in some cases there may be results that are confusing or concerning to you. Not all laboratory results come back in the same time frame and the provider may be waiting for multiple results in order to interpret others.  Please give [place] 48 hours in order for your provider to thoroughly review all the results before contacting the office for clarification of your results.   Thank you for entrusting me with your care and choosing [facility].  Dr Ardis Hughs

## 2020-10-13 ENCOUNTER — Encounter: Payer: Self-pay | Admitting: Oncology

## 2020-10-13 NOTE — Progress Notes (Signed)
I connected with Abigail Willis on 10/13/20 at  2:30 PM EDT by video enabled telemedicine visit and verified that I am speaking with the correct person using two identifiers.   I discussed the limitations, risks, security and privacy concerns of performing an evaluation and management service by telemedicine and the availability of in-person appointments. I also discussed with the patient that there may be a patient responsible charge related to this service. The patient expressed understanding and agreed to proceed.  Other persons participating in the visit and their role in the encounter:  none  Patient's location:  work Provider's location:  home  Chief Complaint: Routine follow-up of secondary polycythemia and discussed results of blood work  History of present illness: patient is a 75 year old female with a past medical history significant for CAD hyperlipidemia and smoking referred for polycythemia.  Her most recent CBC from4/27/2022 which showed H&H of 16.1/48.2 with a normal white count and platelet count.  Of note her hemoglobin has been between 15-16 at least for the last 3 years.  No significant upward trend during this time.  Patient reports ongoing fatigue.  Denies any unintentional weight loss denies any blood in her urine   Results of blood work from 09/25/2020 were as follows: CBC showed white cell count of 8.6, H&H of 16.1/48.9 with a platelet count of 264.  JAK2 mutation testing was negative.  EPO levels normal at 6.1.  Urinalysis did not show any hematuria.  Interval history: Patient is at her baseline state of health other than some fatigue she denies other complaints at this time   Review of Systems  Constitutional: Positive for malaise/fatigue. Negative for chills, fever and weight loss.  HENT: Negative for congestion, ear discharge and nosebleeds.   Eyes: Negative for blurred vision.  Respiratory: Negative for cough, hemoptysis, sputum production, shortness of breath  and wheezing.   Cardiovascular: Negative for chest pain, palpitations, orthopnea and claudication.  Gastrointestinal: Negative for abdominal pain, blood in stool, constipation, diarrhea, heartburn, melena, nausea and vomiting.  Genitourinary: Negative for dysuria, flank pain, frequency, hematuria and urgency.  Musculoskeletal: Negative for back pain, joint pain and myalgias.  Skin: Negative for rash.  Neurological: Negative for dizziness, tingling, focal weakness, seizures, weakness and headaches.  Endo/Heme/Allergies: Does not bruise/bleed easily.  Psychiatric/Behavioral: Negative for depression and suicidal ideas. The patient does not have insomnia.     Allergies  Allergen Reactions  . Avelox [Moxifloxacin Hcl In Nacl] Other (See Comments)    Severe stomach cramping and nausea  . Penicillins Rash    Past Medical History:  Diagnosis Date  . Allergy    SINUS  . Colon polyps 11/14/2013   Tubular adenoma, hyperplastic polyps  . Diverticulosis 11/15/2010   Moderate  . External hemorrhoids   . Hyperlipidemia   . IBS (irritable bowel syndrome)   . Squamous cell carcinoma in situ   . Vertigo     Past Surgical History:  Procedure Laterality Date  . BREAST BIOPSY    . COLONOSCOPY    . GANGLION CYST EXCISION    . HAMMER TOE SURGERY    . HEMORRHOID BANDING    . LEEP      twice, in [place]  . MOHS SURGERY  2019  . TONSILLECTOMY    . TUBAL LIGATION      Social History   Socioeconomic History  . Marital status: Married    Spouse name: Not on file  . Number of children: 1  . Years of education: Not  on file  . Highest education level: Not on file  Occupational History  . Occupation: Retired  Tobacco Use  . Smoking status: Current Every Day Smoker    Packs/day: 1.00    Years: 52.00    Pack years: 52.00    Types: Cigarettes  . Smokeless tobacco: Never Used  Vaping Use  . Vaping Use: Never used  Substance and Sexual Activity  . Alcohol use: Yes    Alcohol/week: 7.0  standard drinks    Types: 7 Glasses of wine per week    Comment: SOCIAL  . Drug use: No  . Sexual activity: Yes  Other Topics Concern  . Not on file  Social History Narrative   Moved from [place] to be closer to her daughter, spends winters at [facility] home.      Has HPOA- husband Cletus Gash.   Would desire feeding tube, no prolonged life support.   Not sure about feeding tubes.            Social Determinants of Health   Financial Resource Strain: Low Risk   . Difficulty of Paying Living Expenses: Not hard at all  Food Insecurity: No Food Insecurity  . Worried About Charity fundraiser in the Last Year: Never true  . Ran Out of Food in the Last Year: Never true  Transportation Needs: No Transportation Needs  . Lack of Transportation (Medical): No  . Lack of Transportation (Non-Medical): No  Physical Activity: Not on file  Stress: No Stress Concern Present  . Feeling of Stress : Not at all  Social Connections: Unknown  . Frequency of Communication with Friends and Family: More than three times a week  . Frequency of Social Gatherings with Friends and Family: More than three times a week  . Attends Religious Services: Not on file  . Active Member of Clubs or Organizations: Yes  . Attends Archivist Meetings: More than 4 times per year  . Marital Status: Married  Human resources officer Violence: Not At Risk  . Fear of Current or Ex-Partner: No  . Emotionally Abused: No  . Physically Abused: No  . Sexually Abused: No    Family History  Problem Relation Age of Onset  . Heart attack Mother 65  . Ovarian cancer Mother   . Asthma Father   . Brain cancer Sister   . Colon cancer Cousin   . Breast cancer Neg Hx   . Adrenal disorder Neg Hx      Current Outpatient Medications:  .  acetaminophen (TYLENOL) 500 MG tablet, Take 500 mg by mouth as needed., Disp: , Rfl:  .  aspirin 81 MG tablet, Take 81 mg by mouth daily., Disp: , Rfl:  .  cyanocobalamin (,VITAMIN  B-12,) 1000 MCG/ML injection, Inject 1,000 mcg into the muscle once., Disp: , Rfl:  .  hyoscyamine (LEVBID) 0.375 MG 12 hr tablet, Take 1 tablet (0.375 mg total) by mouth every 12 (twelve) hours as needed., Disp: 60 tablet, Rfl: 2 .  ibuprofen (ADVIL,MOTRIN) 200 MG tablet, Take 200 mg by mouth as needed., Disp: , Rfl:  .  loperamide (IMODIUM A-D) 2 MG tablet, Take 1 tablet (2 mg total) by mouth in the morning., Disp: 30 tablet, Rfl: 0 .  pravastatin (PRAVACHOL) 10 MG tablet, Take 1 tablet (10 mg total) by mouth every evening., Disp: 90 tablet, Rfl: 3 .  pseudoephedrine-guaifenesin (MUCINEX D) 60-600 MG per tablet, Take 1 tablet by mouth every 12 (twelve) hours as needed., Disp: ,  Rfl:  .  sodium chloride (OCEAN) 0.65 % SOLN nasal spray, Place 1 spray into both nostrils as needed for congestion., Disp: , Rfl:   No results found.  No images are attached to the encounter.   CMP Latest Ref Rng & Units 11/29/2019  Glucose 70 - 99 mg/dL 101(H)  BUN 6 - 23 mg/dL 9  Creatinine 0.40 - 1.20 mg/dL 0.57  Sodium 135 - 145 mEq/L 139  Potassium 3.5 - 5.1 mEq/L 4.2  Chloride 96 - 112 mEq/L 104  CO2 19 - 32 mEq/L 28  Calcium 8.4 - 10.5 mg/dL 9.3  Total Protein 6.0 - 8.3 g/dL 6.9  Total Bilirubin 0.2 - 1.2 mg/dL 0.4  Alkaline Phos 39 - 117 U/L 70  AST 0 - 37 U/L 16  ALT 0 - 35 U/L 11   CBC Latest Ref Rng & Units 09/25/2020  WBC 4.0 - 10.5 K/uL 8.6  Hemoglobin 12.0 - 15.0 g/dL 16.1(H)  Hematocrit 36.0 - 46.0 % 48.9(H)  Platelets 150 - 400 K/uL 264     Observation/objective: Appears in no acute distress over video visit today.  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  Given that she is JAK2 negative with normal EPO levels this is not consistent with polycythemia vera.  For secondary polycythemia phlebotomy is usually not recommended until hematocrit is greater than 50-55.  Patient does not require a phlebotomy at this time.  Secondary polycythemia is also not associated with increased risk of thromboembolism until the hematocrit is more than 55.    Follow-up instructions: Repeat CBC with differential in 4 months and 8 months and I will see her back in 8 months  I discussed the assessment and treatment plan with the patient. The patient was provided an opportunity to ask questions and all were answered. The patient agreed with the plan and demonstrated an understanding of the instructions.   The patient was advised to call back or seek an in-person evaluation if the symptoms worsen or if the condition fails to improve as anticipated.  Visit Diagnosis: 1. Secondary polycythemia     Dr. Randa Evens, MD, MPH [facility] Tel- [phone] 10/13/2020 1:12 PM

## 2020-10-16 ENCOUNTER — Other Ambulatory Visit: Payer: Self-pay

## 2020-10-16 ENCOUNTER — Ambulatory Visit (INDEPENDENT_AMBULATORY_CARE_PROVIDER_SITE_OTHER): Payer: Medicare Other

## 2020-10-16 DIAGNOSIS — E538 Deficiency of other specified B group vitamins: Secondary | ICD-10-CM | POA: Diagnosis not present

## 2020-10-16 MED ORDER — CYANOCOBALAMIN 1000 MCG/ML IJ SOLN
1000.0000 ug | Freq: Once | INTRAMUSCULAR | Status: AC
  Administered 2020-10-16: 1000 ug via INTRAMUSCULAR

## 2020-10-16 NOTE — Progress Notes (Signed)
Patient presented for B 12 injection to left deltoid, patient voiced no concerns nor showed any signs of distress during injection. 

## 2020-10-17 ENCOUNTER — Encounter: Payer: Self-pay | Admitting: Family

## 2020-10-17 ENCOUNTER — Other Ambulatory Visit: Payer: Self-pay

## 2020-10-17 ENCOUNTER — Ambulatory Visit (INDEPENDENT_AMBULATORY_CARE_PROVIDER_SITE_OTHER): Payer: Medicare Other | Admitting: Family

## 2020-10-17 DIAGNOSIS — Z1231 Encounter for screening mammogram for malignant neoplasm of breast: Secondary | ICD-10-CM

## 2020-10-17 DIAGNOSIS — E785 Hyperlipidemia, unspecified: Secondary | ICD-10-CM | POA: Diagnosis not present

## 2020-10-17 DIAGNOSIS — Z0000 Encounter for general adult medical examination without abnormal findings: Secondary | ICD-10-CM | POA: Diagnosis not present

## 2020-10-17 NOTE — Patient Instructions (Signed)
Nice to see you!   Health Maintenance for Postmenopausal Women Menopause is a normal process in which your ability to get pregnant comes to an end. This process happens slowly over many months or years, usually between the ages of 83 and 32. Menopause is complete when you have missed your menstrual periods for 12 months. It is important to talk with your health care provider about some of the most common conditions that affect women after menopause (postmenopausal women). These include heart disease, cancer, and bone loss (osteoporosis). Adopting a healthy lifestyle and getting preventive care can help to promote your health and wellness. The actions you take can also lower your chances of developing some of these common conditions. What should I know about menopause? During menopause, you may get a number of symptoms, such as:  Hot flashes. These can be moderate or severe.  Night sweats.  Decrease in sex drive.  Mood swings.  Headaches.  Tiredness.  Irritability.  Memory problems.  Insomnia. Choosing to treat or not to treat these symptoms is a decision that you make with your health care provider. Do I need hormone replacement therapy?  Hormone replacement therapy is effective in treating symptoms that are caused by menopause, such as hot flashes and night sweats.  Hormone replacement carries certain risks, especially as you become older. If you are thinking about using estrogen or estrogen with progestin, discuss the benefits and risks with your health care provider. What is my risk for heart disease and stroke? The risk of heart disease, heart attack, and stroke increases as you age. One of the causes may be a change in the body's hormones during menopause. This can affect how your body uses dietary fats, triglycerides, and cholesterol. Heart attack and stroke are medical emergencies. There are many things that you can do to help prevent heart disease and stroke. Watch your  blood pressure  High blood pressure causes heart disease and increases the risk of stroke. This is more likely to develop in people who have high blood pressure readings, are of African descent, or are overweight.  Have your blood pressure checked: ? Every 3-5 years if you are 44-59 years of age. ? Every year if you are 19 years old or older. Eat a healthy diet  Eat a diet that includes plenty of vegetables, fruits, low-fat dairy products, and lean protein.  Do not eat a lot of foods that are high in solid fats, added sugars, or sodium.   Get regular exercise Get regular exercise. This is one of the most important things you can do for your health. Most adults should:  Try to exercise for at least 150 minutes each week. The exercise should increase your heart rate and make you sweat (moderate-intensity exercise).  Try to do strengthening exercises at least twice each week. Do these in addition to the moderate-intensity exercise.  Spend less time sitting. Even light physical activity can be beneficial. Other tips  Work with your health care provider to achieve or maintain a healthy weight.  Do not use any products that contain nicotine or tobacco, such as cigarettes, e-cigarettes, and chewing tobacco. If you need help quitting, ask your health care provider.  Know your numbers. Ask your health care provider to check your cholesterol and your blood sugar (glucose). Continue to have your blood tested as directed by your health care provider. Do I need screening for cancer? Depending on your health history and family history, you may need to have cancer screening  at different stages of your life. This may include screening for:  Breast cancer.  Cervical cancer.  Lung cancer.  Colorectal cancer. What is my risk for osteoporosis? After menopause, you may be at increased risk for osteoporosis. Osteoporosis is a condition in which bone destruction happens more quickly than new bone  creation. To help prevent osteoporosis or the bone fractures that can happen because of osteoporosis, you may take the following actions:  If you are 4-31 years old, get at least 1,000 mg of calcium and at least 600 mg of vitamin D per day.  If you are older than age 27 but younger than age 25, get at least 1,200 mg of calcium and at least 600 mg of vitamin D per day.  If you are older than age 59, get at least 1,200 mg of calcium and at least 800 mg of vitamin D per day. Smoking and drinking excessive alcohol increase the risk of osteoporosis. Eat foods that are rich in calcium and vitamin D, and do weight-bearing exercises several times each week as directed by your health care provider. How does menopause affect my mental health? Depression may occur at any age, but it is more common as you become older. Common symptoms of depression include:  Low or sad mood.  Changes in sleep patterns.  Changes in appetite or eating patterns.  Feeling an overall lack of motivation or enjoyment of activities that you previously enjoyed.  Frequent crying spells. Talk with your health care provider if you think that you are experiencing depression. General instructions See your health care provider for regular wellness exams and vaccines. This may include:  Scheduling regular health, dental, and eye exams.  Getting and maintaining your vaccines. These include: ? Influenza vaccine. Get this vaccine each year before the flu season begins. ? Pneumonia vaccine. ? Shingles vaccine. ? Tetanus, diphtheria, and pertussis (Tdap) booster vaccine. Your health care provider may also recommend other immunizations. Tell your health care provider if you have ever been abused or do not feel safe at home. Summary  Menopause is a normal process in which your ability to get pregnant comes to an end.  This condition causes hot flashes, night sweats, decreased interest in sex, mood swings, headaches, or lack of  sleep.  Treatment for this condition may include hormone replacement therapy.  Take actions to keep yourself healthy, including exercising regularly, eating a healthy diet, watching your weight, and checking your blood pressure and blood sugar levels.  Get screened for cancer and depression. Make sure that you are up to date with all your vaccines. This information is not intended to replace advice given to you by your health care provider. Make sure you discuss any questions you have with your health care provider. Document Revised: 04/28/2018 Document Reviewed: 04/28/2018 Elsevier Patient Education  2021 [employer].

## 2020-10-17 NOTE — Assessment & Plan Note (Signed)
CBE performed. Patient will schedule mammogram and CT lung cancer screen. Deferred pelvic exam in the absence of complaints and she is no  Longer screening for cervical cancer

## 2020-10-17 NOTE — Progress Notes (Signed)
Subjective:    Patient ID: Abigail Willis, female    DOB: 1945-07-05, 75 y.o.   MRN: [id number]  CC: Abigail Willis is a 75 y.o. female who presents today for physical exam.    HPI: Feels well today No new complaints   Compliant with pravachol. Tolerating well.   Secondary polycythemia - following with Dr Janese Banks ; likely secondary due to smoking/COPD   Colorectal Cancer Screening: Planning colonoscopy with Dr Ardis Hughs after consult 10/10/20, colonoscopy scheduled tomorrow.   Breast Cancer Screening: Mammogram UTD,  Cervical Cancer Screening: mother had ovarian cancer. Last pap smear appears abstracted 10 years ago.  Bone Health screening/DEXA for 65+: UTD ; known osteoporosis  Lung Cancer Screening: participating; due 12/2020 and ordered         Tetanus - due         Labs: Screening labs today. Exercise: Gets regular exercise.   Alcohol use:  occassional Smoking/tobacco use: smoker.   H/o scc- follows with dermatology in [place] 2-3 times per year  HISTORY:  Past Medical History:  Diagnosis Date  . Allergy    SINUS  . Colon polyps 11/14/2013   Tubular adenoma, hyperplastic polyps  . Diverticulosis 11/15/2010   Moderate  . External hemorrhoids   . Hyperlipidemia   . IBS (irritable bowel syndrome)   . Squamous cell carcinoma in situ   . Vertigo     Past Surgical History:  Procedure Laterality Date  . BREAST BIOPSY    . COLONOSCOPY    . GANGLION CYST EXCISION    . HAMMER TOE SURGERY    . HEMORRHOID BANDING    . LEEP      twice, in [place]  . MOHS SURGERY  2019  . TONSILLECTOMY    . TUBAL LIGATION     Family History  Problem Relation Age of Onset  . Heart attack Mother 63  . Ovarian cancer Mother   . Asthma Father   . Brain cancer Sister   . Colon cancer Cousin   . Breast cancer Neg Hx   . Adrenal disorder Neg Hx       ALLERGIES: Avelox [moxifloxacin hcl in nacl] and Penicillins  Current Outpatient Medications on File Prior to  Visit  Medication Sig Dispense Refill  . acetaminophen (TYLENOL) 500 MG tablet Take 500 mg by mouth as needed.    Marland Kitchen aspirin 81 MG tablet Take 81 mg by mouth daily.    . cyanocobalamin (,VITAMIN B-12,) 1000 MCG/ML injection Inject 1,000 mcg into the muscle once.    . hyoscyamine (LEVBID) 0.375 MG 12 hr tablet Take 1 tablet (0.375 mg total) by mouth every 12 (twelve) hours as needed. 60 tablet 2  . ibuprofen (ADVIL,MOTRIN) 200 MG tablet Take 200 mg by mouth as needed.    . loperamide (IMODIUM A-D) 2 MG tablet Take 1 tablet (2 mg total) by mouth in the morning. 30 tablet 0  . pravastatin (PRAVACHOL) 10 MG tablet Take 1 tablet (10 mg total) by mouth every evening. 90 tablet 3  . pseudoephedrine-guaifenesin (MUCINEX D) 60-600 MG per tablet Take 1 tablet by mouth every 12 (twelve) hours as needed.    . sodium chloride (OCEAN) 0.65 % SOLN nasal spray Place 1 spray into both nostrils as needed for congestion.     No current facility-administered medications on file prior to visit.    Social History   Tobacco Use  . Smoking status: Current Every Day Smoker    Packs/day: 1.00  Years: 52.00    Pack years: 52.00    Types: Cigarettes  . Smokeless tobacco: Never Used  Vaping Use  . Vaping Use: Never used  Substance Use Topics  . Alcohol use: Yes    Alcohol/week: 7.0 standard drinks    Types: 7 Glasses of wine per week    Comment: SOCIAL  . Drug use: No    Review of Systems  Constitutional: Negative for chills, fever and unexpected weight change.  HENT: Negative for congestion.   Respiratory: Negative for cough.   Cardiovascular: Negative for chest pain, palpitations and leg swelling.  Gastrointestinal: Negative for abdominal distention, abdominal pain, nausea and vomiting.  Genitourinary: Negative for difficulty urinating and pelvic pain.  Musculoskeletal: Negative for arthralgias and myalgias.  Skin: Negative for rash.  Neurological: Negative for headaches.  Hematological: Negative  for adenopathy.  Psychiatric/Behavioral: Negative for confusion.      Objective:    BP 132/62 (BP Location: Left Arm, Patient Position: Sitting, Cuff Size: Large)   Pulse 73   Temp 98.6 F (37 C) (Oral)   Ht 5\' 5"  (1.651 m)   Wt 151 lb 3.2 oz (68.6 kg)   SpO2 97%   BMI 25.16 kg/m   BP Readings from Last 3 Encounters:  10/17/20 132/62  10/10/20 130/68  09/25/20 125/79   Wt Readings from Last 3 Encounters:  10/17/20 151 lb 3.2 oz (68.6 kg)  10/10/20 152 lb (68.9 kg)  10/10/20 152 lb 12.8 oz (69.3 kg)    Physical Exam Vitals reviewed.  Constitutional:      Appearance: Normal appearance. She is well-developed.  Eyes:     Conjunctiva/sclera: Conjunctivae normal.  Neck:     Thyroid: No thyroid mass or thyromegaly.  Cardiovascular:     Rate and Rhythm: Normal rate and regular rhythm.     Pulses: Normal pulses.     Heart sounds: Normal heart sounds.  Pulmonary:     Effort: Pulmonary effort is normal.     Breath sounds: Normal breath sounds. No wheezing, rhonchi or rales.  Chest:  Breasts: Breasts are symmetrical.     Right: No inverted nipple, mass, nipple discharge, skin change or tenderness.     Left: No inverted nipple, mass, nipple discharge, skin change or tenderness.    Abdominal:     General: Bowel sounds are normal. There is no distension.     Palpations: Abdomen is soft. Abdomen is not rigid. There is no fluid wave or mass.     Tenderness: There is no abdominal tenderness. There is no guarding or rebound.  Lymphadenopathy:     Head:     Right side of head: No submental, submandibular, tonsillar, preauricular, posterior auricular or occipital adenopathy.     Left side of head: No submental, submandibular, tonsillar, preauricular, posterior auricular or occipital adenopathy.     Cervical: No cervical adenopathy.     Right cervical: No superficial, deep or posterior cervical adenopathy.    Left cervical: No superficial, deep or posterior cervical adenopathy.   Skin:    General: Skin is warm and dry.  Neurological:     Mental Status: She is alert.  Psychiatric:        Speech: Speech normal.        Behavior: Behavior normal.        Thought Content: Thought content normal.        Assessment & Plan:   Problem List Items Addressed This Visit      Other   Hyperlipidemia  LDL goal <70    Controlled. Continue pravachol 10mg       Routine physical examination    CBE performed. Patient will schedule mammogram and CT lung cancer screen. Deferred pelvic exam in the absence of complaints and she is no  Longer screening for cervical cancer          I am having Abigail Willis maintain her aspirin, ibuprofen, pseudoephedrine-guaifenesin, acetaminophen, sodium chloride, hyoscyamine, pravastatin, cyanocobalamin, and loperamide.   No orders of the defined types were placed in this encounter.   Return precautions given.   Risks, benefits, and alternatives of the medications and treatment plan prescribed today were discussed, and patient expressed understanding.   Education regarding symptom management and diagnosis given to patient on AVS.   Continue to follow with Burnard Hawthorne, FNP for routine health maintenance.   [place] and I agreed with plan.   Mable Paris, FNP

## 2020-10-17 NOTE — Assessment & Plan Note (Signed)
Controlled. Continue pravachol 10mg 

## 2020-10-18 ENCOUNTER — Ambulatory Visit (INDEPENDENT_AMBULATORY_CARE_PROVIDER_SITE_OTHER): Payer: Medicare Other

## 2020-10-18 ENCOUNTER — Ambulatory Visit: Payer: Medicare Other | Admitting: Podiatry

## 2020-10-18 ENCOUNTER — Other Ambulatory Visit: Payer: Self-pay | Admitting: Podiatry

## 2020-10-18 ENCOUNTER — Encounter: Payer: Self-pay | Admitting: Podiatry

## 2020-10-18 ENCOUNTER — Other Ambulatory Visit: Payer: Self-pay

## 2020-10-18 DIAGNOSIS — S86892A Other injury of other muscle(s) and tendon(s) at lower leg level, left leg, initial encounter: Secondary | ICD-10-CM

## 2020-10-18 DIAGNOSIS — M19072 Primary osteoarthritis, left ankle and foot: Secondary | ICD-10-CM

## 2020-10-18 NOTE — Progress Notes (Signed)
Subjective:  Patient ID: Abigail Willis, female    DOB: 03/24/1946,  MRN: [id number]  Chief Complaint  Patient presents with  . Ankle Pain    Patient presents today for left ankle pain.  She says it hurts all over and radiates up her leg a lot.    75 y.o. female presents with the above complaint.  Patient presents with complaint of left anterior leg pain.  She states that has been hurting for quite some time is probably progressive gotten worse and radiates up the leg.  She states that she has been doing some walking since the cruise that could have aggravated it.  She thought it might be the arthritis in her ankle.  She has not seen anyone else prior to seeing me.  She denies any other acute complaints.  She would like to discuss treatment options for it.  She has tried taking it easy which has not helped.  She does not wear supportive shoes.   Review of Systems: Negative except as noted in the HPI. Denies N/V/F/Ch.  Past Medical History:  Diagnosis Date  . Allergy    SINUS  . Colon polyps 11/14/2013   Tubular adenoma, hyperplastic polyps  . Diverticulosis 11/15/2010   Moderate  . External hemorrhoids   . Hyperlipidemia   . IBS (irritable bowel syndrome)   . Squamous cell carcinoma in situ   . Vertigo     Current Outpatient Medications:  .  acetaminophen (TYLENOL) 500 MG tablet, Take 500 mg by mouth as needed., Disp: , Rfl:  .  aspirin 81 MG tablet, Take 81 mg by mouth daily., Disp: , Rfl:  .  cyanocobalamin (,VITAMIN B-12,) 1000 MCG/ML injection, Inject 1,000 mcg into the muscle once., Disp: , Rfl:  .  hyoscyamine (LEVBID) 0.375 MG 12 hr tablet, Take 1 tablet (0.375 mg total) by mouth every 12 (twelve) hours as needed., Disp: 60 tablet, Rfl: 2 .  ibuprofen (ADVIL,MOTRIN) 200 MG tablet, Take 200 mg by mouth as needed., Disp: , Rfl:  .  loperamide (IMODIUM A-D) 2 MG tablet, Take 1 tablet (2 mg total) by mouth in the morning., Disp: 30 tablet, Rfl: 0 .  pravastatin  (PRAVACHOL) 10 MG tablet, Take 1 tablet (10 mg total) by mouth every evening., Disp: 90 tablet, Rfl: 3 .  pseudoephedrine-guaifenesin (MUCINEX D) 60-600 MG per tablet, Take 1 tablet by mouth every 12 (twelve) hours as needed., Disp: , Rfl:  .  sodium chloride (OCEAN) 0.65 % SOLN nasal spray, Place 1 spray into both nostrils as needed for congestion., Disp: , Rfl:   Social History   Tobacco Use  Smoking Status Current Every Day Smoker  . Packs/day: 1.00  . Years: 52.00  . Pack years: 52.00  . Types: Cigarettes  Smokeless Tobacco Never Used    Allergies  Allergen Reactions  . Avelox [Moxifloxacin Hcl In Nacl] Other (See Comments)    Severe stomach cramping and nausea  . Penicillins Rash   Objective:  There were no vitals filed for this visit. There is no height or weight on file to calculate BMI. Constitutional Well developed. Well nourished.  Vascular Dorsalis pedis pulses palpable bilaterally. Posterior tibial pulses palpable bilaterally. Capillary refill normal to all digits.  No cyanosis or clubbing noted. Pedal hair growth normal.  Neurologic Normal speech. Oriented to person, place, and time. Epicritic sensation to light touch grossly present bilaterally.  Dermatologic Nails well groomed and normal in appearance. No open wounds. No skin lesions.  Orthopedic:  Pain along the anterior shin up into the distal tibia on palpation.  Pain with mild range of motion of the extensor compartment of the leg.  Pain with resisted dorsiflexion of the foot.  No pain with plantarflexion of the foot resisted.   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  Mild at the midfoot joint.  No bony abnormalities identified no fractures noted.  No osteoporosis noted. Assessment:   1. Left medial tibial stress syndrome, initial encounter    Plan:  Patient was evaluated and treated and all questions answered.  Left  anterior shin splint versus extensor tendinitis -I explained the patient the etiology of shinsplints versus extensor tendinitis and various treatment options were extensively discussed.  I discussed good shoe gear modification with good supportive sneakers.  I instructed her that she will benefit from new balance sneakers.  I also believe given the amount of pain that she is having she would benefit from cam boot immobilization.  She agrees with the plan would like to proceed with cam boot immobilization.  No follow-ups on file.

## 2020-10-19 ENCOUNTER — Encounter: Payer: Self-pay | Admitting: Gastroenterology

## 2020-10-19 ENCOUNTER — Ambulatory Visit (AMBULATORY_SURGERY_CENTER): Payer: Medicare Other | Admitting: Gastroenterology

## 2020-10-19 VITALS — BP 120/64 | HR 58 | Temp 96.9°F | Resp 19 | Ht 65.0 in | Wt 152.0 lb

## 2020-10-19 DIAGNOSIS — K625 Hemorrhage of anus and rectum: Secondary | ICD-10-CM

## 2020-10-19 DIAGNOSIS — K648 Other hemorrhoids: Secondary | ICD-10-CM

## 2020-10-19 DIAGNOSIS — Z86010 Personal history of colonic polyps: Secondary | ICD-10-CM

## 2020-10-19 MED ORDER — SODIUM CHLORIDE 0.9 % IV SOLN: 500.0000 mL | Freq: Once | INTRAVENOUS | Status: DC

## 2020-10-19 NOTE — Progress Notes (Signed)
Report to PACU, RN, vss, BBS= Clear.  

## 2020-10-19 NOTE — Progress Notes (Signed)
check-in-tanacia  Vital signs-ka

## 2020-10-19 NOTE — Progress Notes (Signed)
Called to room to assist during endoscopic procedure.  Patient ID and intended procedure confirmed with present staff. Received instructions for my participation in the procedure from the performing physician.  

## 2020-10-19 NOTE — Op Note (Signed)
[facility] Patient Name: Abigail Willis [facility] Procedure Date: 10/19/2020 3:16 PM MRN: [id number] Endoscopist: Milus Banister , MD Age: 75 Referring MD:  Date of Birth: 03/14/1946 Gender: Female Account #: [id number] Procedure:                Colonoscopy Indications:              Rectal bleeding, h/o polyps, frequent stooling;                            Colonoscopy 10/2010 Dr. Ardis Hughs (for previous                            adenomatous polyps in [facility] including a 1cm                            pedunculated polyp in 1998) 4 subCM polyps removed,                            one was adenomatous, She was recommended to have                            repeat colonoscopy at 5 year interval.                             Colonoscopy September 2017 Dr. Ardis Hughs found 3                            subcentimeter polyps. Also internal and external                            hemorrhoids. 2 of these polyps were sessile                            serrated adenomas, the other was hyperplastic. Medicines:                Monitored Anesthesia Care Procedure:                Pre-Anesthesia Assessment:                           - Prior to the procedure, a History and Physical                            was performed, and patient medications and                            allergies were reviewed. The patient's tolerance of                            previous anesthesia was also reviewed. The risks                            and benefits of the procedure and the sedation  options and risks were discussed with the patient.                            All questions were answered, and informed consent                            was obtained. Prior Anticoagulants: The patient has                            taken no previous anticoagulant or antiplatelet                            agents. ASA Grade Assessment: II - A patient with                            mild systemic disease.  After reviewing the risks                            and benefits, the patient was deemed in                            satisfactory condition to undergo the procedure.                           After obtaining informed consent, the colonoscope                            was passed under direct vision. Throughout the                            procedure, the patient's blood pressure, pulse, and                            oxygen saturations were monitored continuously. The                            Olympus CF-HQ190L ([id number]) Colonoscope was                            introduced through the anus and advanced to the the                            terminal ileum. The colonoscopy was performed                            without difficulty. The patient tolerated the                            procedure well. The quality of the bowel                            preparation was good. The terminal ileum, ileocecal  valve, appendiceal orifice, and rectum were                            photographed. Scope In: 3:19:59 PM Scope Out: 3:31:06 PM Scope Withdrawal Time: 0 hours 7 minutes 9 seconds  Total Procedure Duration: 0 hours 11 minutes 7 seconds  Findings:                 The terminal ileum appeared normal.                           The colon (entire examined portion) appeared                            slightly erhythematous. Biopsies for histology were                            taken with a cold forceps from the right and left                            colon segments for evaluation of microscopic                            colitis.                           External and internal hemorrhoids were found. The                            hemorrhoids were medium-sized.                           The exam was otherwise without abnormality on                            direct and retroflexion views.                           No polyps or cancers. Complications:             No immediate complications. Estimated blood loss:                            None. Estimated Blood Loss:     Estimated blood loss: none. Impression:               - The examined portion of the ileum was normal.                           - The colon was slightly erythematous throughout.                            Prep effect? Mild inflammation? Biopsies taken.                           - External and internal hemorrhoids. Consider  repeat hemorrhoidal banding, these are the likely                            source of your intermittent rectal bleeding.                           - The examination was otherwise normal on direct                            and retroflexion views. Recommendation:           - Patient has a contact number available for                            emergencies. The signs and symptoms of potential                            delayed complications were discussed with the                            patient. Return to normal activities tomorrow.                            Written discharge instructions were provided to the                            patient.                           - Resume previous diet.                           - Continue present medications.                           - Await pathology results. Milus Banister, MD 10/19/2020 3:36:35 PM This report has been signed electronically.

## 2020-10-19 NOTE — Patient Instructions (Signed)
YOU HAD AN ENDOSCOPIC PROCEDURE TODAY AT THE [facility]:   Refer to the procedure report that was given to you for any specific questions about what was found during the examination.  If the procedure report does not answer your questions, please call your gastroenterologist to clarify.  If you requested that your care partner not be given the details of your procedure findings, then the procedure report has been included in a sealed envelope for you to review at your convenience later.  YOU SHOULD EXPECT: Some feelings of bloating in the abdomen. Passage of more gas than usual.  Walking can help get rid of the air that was put into your GI tract during the procedure and reduce the bloating. If you had a lower endoscopy (such as a colonoscopy or flexible sigmoidoscopy) you may notice spotting of blood in your stool or on the toilet paper. If you underwent a bowel prep for your procedure, you may not have a normal bowel movement for a few days.  Please Note:  You might notice some irritation and congestion in your nose or some drainage.  This is from the oxygen used during your procedure.  There is no need for concern and it should clear up in a day or so.  SYMPTOMS TO REPORT IMMEDIATELY:   Following lower endoscopy (colonoscopy or flexible sigmoidoscopy):  Excessive amounts of blood in the stool  Significant tenderness or worsening of abdominal pains  Swelling of the abdomen that is new, acute  Fever of 100F or higher   Following upper endoscopy (EGD)  Vomiting of blood or coffee ground material  New chest pain or pain under the shoulder blades  Painful or persistently difficult swallowing  New shortness of breath  Fever of 100F or higher  Black, tarry-looking stools  For urgent or emergent issues, a gastroenterologist can be reached at any hour by calling [phone]. Do not use MyChart messaging for urgent concerns.    DIET:  We do recommend a small meal at first, but  then you may proceed to your regular diet.  Drink plenty of fluids but you should avoid alcoholic beverages for 24 hours.  ACTIVITY:  You should plan to take it easy for the rest of today and you should NOT DRIVE or use heavy machinery until tomorrow (because of the sedation medicines used during the test).    FOLLOW UP: Our staff will call the number listed on your records 48-72 hours following your procedure to check on you and address any questions or concerns that you may have regarding the information given to you following your procedure. If we do not reach you, we will leave a message.  We will attempt to reach you two times.  During this call, we will ask if you have developed any symptoms of COVID 19. If you develop any symptoms (ie: fever, flu-like symptoms, shortness of breath, cough etc.) before then, please call [phone].  If you test positive for Covid 19 in the 2 weeks post procedure, please call and report this information to us.    If any biopsies were taken you will be contacted by phone or by letter within the next 1-3 weeks.  Please call us at [phone] if you have not heard about the biopsies in 3 weeks.    SIGNATURES/CONFIDENTIALITY: You and/or your care partner have signed paperwork which will be entered into your electronic medical record.  These signatures attest to the fact that that the information above on   your After Visit Summary has been reviewed and is understood.  Full responsibility of the confidentiality of this discharge information lies with you and/or your care-partner. 

## 2020-10-23 ENCOUNTER — Telehealth: Payer: Self-pay | Admitting: *Deleted

## 2020-10-23 NOTE — Telephone Encounter (Signed)
  Follow up Call-  Call back number 10/19/2020  Post procedure Call Back phone  # [phone]  Permission to leave phone message Yes  Some recent data might be hidden     Patient questions:  Do you have a fever, pain , or abdominal swelling? No. Pain Score  0 *  Have you tolerated food without any problems? Yes.    Have you been able to return to your normal activities? Yes.    Do you have any questions about your discharge instructions: Diet   No. Medications  No. Follow up visit  No.  Do you have questions or concerns about your Care? No.  Actions: * If pain score is 4 or above: No action needed, pain <4.  1. Have you developed a fever since your procedure? no  2.   Have you had an respiratory symptoms (SOB or cough) since your procedure? no  3.   Have you tested positive for COVID 19 since your procedure no  4.   Have you had any family members/close contacts diagnosed with the COVID 19 since your procedure?  no   If yes to any of these questions please route to Joylene John, RN and Joella Prince, RN

## 2020-10-26 ENCOUNTER — Encounter: Payer: Self-pay | Admitting: Gastroenterology

## 2020-11-05 ENCOUNTER — Other Ambulatory Visit: Payer: Self-pay

## 2020-11-05 MED ORDER — HYOSCYAMINE SULFATE ER 0.375 MG PO TB12: 0.3750 mg | ORAL_TABLET | Freq: Two times a day (BID) | ORAL | 6 refills | Status: DC | PRN

## 2020-11-15 ENCOUNTER — Ambulatory Visit: Payer: Medicare Other | Admitting: Podiatry

## 2020-11-16 ENCOUNTER — Ambulatory Visit (INDEPENDENT_AMBULATORY_CARE_PROVIDER_SITE_OTHER): Payer: Medicare Other

## 2020-11-16 ENCOUNTER — Other Ambulatory Visit: Payer: Self-pay

## 2020-11-16 DIAGNOSIS — E538 Deficiency of other specified B group vitamins: Secondary | ICD-10-CM

## 2020-11-16 MED ORDER — CYANOCOBALAMIN 1000 MCG/ML IJ SOLN
1000.0000 ug | Freq: Once | INTRAMUSCULAR | Status: AC
  Administered 2020-11-16: 1000 ug via INTRAMUSCULAR

## 2020-11-16 NOTE — Progress Notes (Signed)
Patient presented for B 12 injection to left deltoid, patient voiced no concerns nor showed any signs of distress during injection. 

## 2020-12-18 ENCOUNTER — Other Ambulatory Visit: Payer: Self-pay

## 2020-12-18 ENCOUNTER — Ambulatory Visit (INDEPENDENT_AMBULATORY_CARE_PROVIDER_SITE_OTHER): Payer: Medicare Other

## 2020-12-18 DIAGNOSIS — E538 Deficiency of other specified B group vitamins: Secondary | ICD-10-CM | POA: Diagnosis not present

## 2020-12-18 MED ORDER — CYANOCOBALAMIN 1000 MCG/ML IJ SOLN
1000.0000 ug | Freq: Once | INTRAMUSCULAR | Status: AC
  Administered 2020-12-18: 1000 ug via INTRAMUSCULAR

## 2020-12-18 NOTE — Progress Notes (Signed)
Patient presented for B 12 injection to left deltoid, patient voiced no concerns nor showed any signs of distress during injection. 

## 2021-01-07 ENCOUNTER — Other Ambulatory Visit: Payer: Self-pay

## 2021-01-07 ENCOUNTER — Ambulatory Visit (INDEPENDENT_AMBULATORY_CARE_PROVIDER_SITE_OTHER)
Admission: RE | Admit: 2021-01-07 | Discharge: 2021-01-07 | Disposition: A | Discharge status: Home / Self Care | Payer: Medicare Other | Source: Ambulatory Visit | Attending: Acute Care | Admitting: Acute Care

## 2021-01-07 DIAGNOSIS — F17210 Nicotine dependence, cigarettes, uncomplicated: Secondary | ICD-10-CM

## 2021-01-08 ENCOUNTER — Ambulatory Visit (INDEPENDENT_AMBULATORY_CARE_PROVIDER_SITE_OTHER): Payer: Medicare Other

## 2021-01-08 VITALS — Ht 65.0 in | Wt 152.0 lb

## 2021-01-08 DIAGNOSIS — Z0000 Encounter for general adult medical examination without abnormal findings: Secondary | ICD-10-CM

## 2021-01-08 NOTE — Progress Notes (Signed)
Subjective:   Abigail Willis is a 75 y.o. female who presents for Medicare Annual (Subsequent) preventive examination.  Review of Systems    No ROS.  Medicare Wellness Virtual Visit.  Visual/audio telehealth visit, UTA vital signs.   See social history for additional risk factors.   Cardiac Risk Factors include: advanced age (>33mn, >>22women)     Objective:    Today's Vitals   01/08/21 1455  Weight: 152 lb (68.9 kg)  Height: '5\' 5"'$  (1.651 m)   Body mass index is 25.29 kg/m.  Advanced Directives 01/08/2021 10/10/2020 09/25/2020 01/06/2020 12/14/2018 11/27/2016 01/22/2016  Does Patient Have a Medical Advance Directive? Yes Yes Yes Yes No Yes Yes  Type of AParamedicof ADelcambreLiving will HF[place]Living will [place]Living will H[place]Living will - [place]Living will HPollockLiving will  Does patient want to make changes to medical advance directive? No - Patient declined No - Patient declined No - Patient declined No - Patient declined - - -  Copy of HB[place]in Chart? No - copy requested - No - copy requested No - copy requested - No - copy requested -  Would patient like information on creating a medical advance directive? - - - - No - Patient declined - -    Current Medications (verified) Outpatient Encounter Medications as of 01/08/2021  Medication Sig   acetaminophen (TYLENOL) 500 MG tablet Take 500 mg by mouth as needed.   aspirin 81 MG tablet Take 81 mg by mouth daily.   cyanocobalamin (,VITAMIN B-12,) 1000 MCG/ML injection Inject 1,000 mcg into the muscle once.   hyoscyamine (LEVBID) 0.375 MG 12 hr tablet Take 1 tablet (0.375 mg total) by mouth every 12 (twelve) hours as needed.   ibuprofen (ADVIL,MOTRIN) 200 MG tablet Take 200 mg by mouth as needed.   loperamide (IMODIUM A-D) 2 MG tablet Take 1 tablet (2 mg total) by  mouth in the morning.   pravastatin (PRAVACHOL) 10 MG tablet Take 1 tablet (10 mg total) by mouth every evening.   pseudoephedrine-guaifenesin (MUCINEX D) 60-600 MG per tablet Take 1 tablet by mouth every 12 (twelve) hours as needed.   sodium chloride (OCEAN) 0.65 % SOLN nasal spray Place 1 spray into both nostrils as needed for congestion.   No facility-administered encounter medications on file as of 01/08/2021.    Allergies (verified) Avelox [moxifloxacin hcl in nacl] and Penicillins   History: Past Medical History:  Diagnosis Date   Allergy    SINUS   Cancer ([place]    skin   Cataract    bilateral   Colon polyps 11/14/2013   Tubular adenoma, hyperplastic polyps   COPD (chronic obstructive pulmonary disease) (HCC)    Diverticulosis 11/15/2010   Moderate   External hemorrhoids    Hyperlipidemia    IBS (irritable bowel syndrome)    Osteoporosis    Squamous cell carcinoma in situ    Vertigo    Past Surgical History:  Procedure Laterality Date   BREAST BIOPSY     COLONOSCOPY     GANGLION CYST EXCISION     HAMMER TOE SURGERY     HEMORRHOID BANDING     LEEP      [place]ice, [place]   TONSILLECTOMY     TUBAL LIGATION     Family History  Problem Relation Age of Onset   Heart  attack Mother 66   Ovarian cancer Mother    Asthma Father    Brain cancer Sister    Colon cancer Cousin    Breast cancer Neg Hx    Adrenal disorder Neg Hx    Esophageal cancer Neg Hx    Rectal cancer Neg Hx    Stomach cancer Neg Hx    Social History   Socioeconomic History   Marital status: Married    Spouse name: Not on file   Number of children: 1   Years of education: Not on file   Highest education level: Not on file  Occupational History   Occupation: Retired  Tobacco Use   Smoking status: Every Day    Packs/day: 1.00    Years: 52.00    Pack years: 52.00    Types: Cigarettes   Smokeless tobacco: Never  Vaping Use   Vaping Use: Never used  Substance and  Sexual Activity   Alcohol use: Yes    Alcohol/week: 7.0 standard drinks    Types: 7 Glasses of wine per week    Comment: SOCIAL   Drug use: No   Sexual activity: Yes  Other Topics Concern   Not on file  Social History Narrative   Moved from [place] to be closer to her daughter, spends winters at [facility] home.      Has HPOA- husband Cletus Gash.   Would desire feeding tube, no prolonged life support.   Not sure about feeding tubes.            Social Determinants of Health   Financial Resource Strain: Low Risk    Difficulty of Paying Living Expenses: Not hard at all  Food Insecurity: No Food Insecurity   Worried About Charity fundraiser in the Last Year: Never true   [place] in the Last Year: Never true  Transportation Needs: No Transportation Needs   Lack of Transportation (Medical): No   Lack of Transportation (Non-Medical): No  Physical Activity: Not on file  Stress: No Stress Concern Present   Feeling of Stress : Not at all  Social Connections: Unknown   Frequency of Communication with Friends and Family: More than three times a week   Frequency of Social Gatherings with Friends and Family: More than three times a week   Attends Religious Services: Not on Electrical engineer or Organizations: Yes   Attends Music therapist: More than 4 times per year   Marital Status: Married    Tobacco Counseling Ready to quit: Not Answered Counseling given: Not Answered   Clinical Intake:  Pre-visit preparation completed: Yes        Diabetes: No  How often do you need to have someone help you when you read instructions, pamphlets, or other written materials from your doctor or pharmacy?: 1 - Never   Interpreter Needed?: No      Activities of Daily Living In your present state of health, do you have any difficulty performing the following activities: 01/08/2021  Hearing? N  Vision? N  Difficulty concentrating or making  decisions? N  Walking or climbing stairs? N  Dressing or bathing? N  Doing errands, shopping? N  Preparing Food and eating ? N  Using the Toilet? N  In the past six months, have you accidently leaked urine? N  Do you have problems with loss of bowel control? N  Managing your Medications? N  Managing your Finances? N  Housekeeping or  managing your Housekeeping? N  Some recent data might be hidden    Patient Care Team: Burnard Hawthorne, FNP as PCP - General (Family Medicine) Milus Banister, MD (Gastroenterology) Leandrew Koyanagi, MD (Ophthalmology) Angelique Blonder, DO (Dermatology)  Indicate any recent Medical Services you may have received from other than [facility] providers in the past year (date may be approximate).     Assessment:   This is a routine wellness examination for Abigail Willis.  I connected with Abigail Willis today by telephone and verified that I am speaking with the correct person using two identifiers. Location patient: home Location provider: work Persons participating in the virtual visit: patient, Marine scientist.    I discussed the limitations, risks, security and privacy concerns of performing an evaluation and management service by telephone and the availability of in person appointments. The patient expressed understanding and verbally consented to this telephonic visit.    Interactive audio and video telecommunications were attempted between this provider and patient, however failed, due to patient having technical difficulties OR patient did not have access to video capability.  We continued and completed visit with audio only.  Some vital signs may be absent or patient reported.   Hearing/Vision screen Hearing Screening - Comments:: Patient is able to hear conversational tones without difficulty.  No issues reported. Vision Screening - Comments:: Wears corrective lenses They have seen their ophthalmologist in the last 12 months.    Dietary issues and exercise activities  discussed: Current Exercise Habits: Home exercise routine, Intensity: Mild Regular diet Good water intake   Goals Addressed             This Visit's Progress    Maintain Healthy Lifestyle   On track    Stay active Stay hydrated  Healthy diet       Depression Screen PHQ 2/9 Scores 01/08/2021 10/17/2020 09/12/2020 02/21/2020 01/06/2020 12/29/2018 09/15/2017  PHQ - 2 Score 0 0 0 0 0 0 0    Fall Risk Fall Risk  01/08/2021 02/21/2020 01/17/2020 01/06/2020 11/29/2019  Falls in the past year? 0 0 0 0 0  Number falls in past yr: - 0 - 0 -  Injury with Fall? 0 0 - - -  Follow up Falls evaluation completed Falls evaluation completed - - Falls evaluation completed    [place]: Adequate lighting in your home to reduce risk of falls? Yes   ASSISTIVE DEVICES UTILIZED TO PREVENT FALLS: Life alert? No  Use of a cane, walker or w/c? No    TIMED UP AND GO: Was the test performed? No .    Cognitive Function: Patient is alert and oriented x3.  MMSE/6CIT deferred. Normal by direct communication/observation.  MMSE - Mini Mental State Exam 11/27/2016  Orientation to time 5  Orientation to Place 5  Registration 3  Attention/ Calculation 0  Recall 3  Language- name 2 objects 0  Language- repeat 1  Language- follow 3 step command 3  Language- read & follow direction 0  Write a sentence 0  Copy design 0  Total score 20       Immunizations Immunization History  Administered Date(s) Administered   Fluad Quad(high Dose 65+) 02/08/2019   Influenza Split 03/14/2011, 02/20/2012   Influenza Whole 03/21/2010   Influenza, High Dose Seasonal PF 02/18/2017   Influenza,inj,Quad PF,6+ Mos 02/14/2013, 02/27/2014, 01/25/2015   Influenza-Unspecified 02/18/2017, 02/11/2020   Moderna Sars-Covid-2 Vaccination 05/26/2019, 06/23/2019, 03/12/2020, 09/04/2020   Pneumococcal Conjugate-13 11/21/2013   Pneumococcal Polysaccharide-23 09/03/2010  Td 09/03/2010   Zoster  Recombinat (Shingrix) 09/17/2015, 09/15/2017   Zoster, Live 08/22/2009   Health Maintenance There are no preventive care reminders to display for this patient. Health Maintenance  Topic Date Due   INFLUENZA VACCINE  02/24/2021 (Originally 12/17/2020)   TETANUS/TDAP  09/12/2021 (Originally 09/02/2020)   MAMMOGRAM  02/14/2021   COLONOSCOPY (Pts 45-67yr Insurance coverage will need to be confirmed)  10/19/2025   DEXA SCAN  Completed   COVID-19 Vaccine  Completed   Hepatitis C Screening  Completed   PNA vac Low Risk Adult  Completed   Zoster Vaccines- Shingrix  Completed   HPV VACCINES  Aged Out   Lung Cancer Screening: completed 01/07/21  Vision Screening: Recommended annual ophthalmology exams for early detection of glaucoma and other disorders of the eye.  Dental Screening: Recommended annual dental exams for proper oral hygiene  Community Resource Referral / Chronic Care Management: CRR required this visit?  No   CCM required this visit?  No      Plan:     I have personally reviewed and noted the following in the patient's chart:   Medical and social history Use of alcohol, tobacco or illicit drugs  Current medications and supplements including opioid prescriptions. Not taking opioid.  Functional ability and status Nutritional status Physical activity Advanced directives List of other physicians Hospitalizations, surgeries, and ER visits in previous 12 months Vitals Screenings to include cognitive, depression, and falls Referrals and appointments  In addition, I have reviewed and discussed with patient certain preventive protocols, quality metrics, and best practice recommendations. A written personalized care plan for preventive services as well as general preventive health recommendations were provided to patient via mychart.     OVarney Biles LPN   [id number]

## 2021-01-08 NOTE — Patient Instructions (Signed)
  Abigail Willis , Thank you for taking time to come for your Medicare Wellness Visit. I appreciate your ongoing commitment to your health goals. Please review the following plan we discussed and let me know if I can assist you in the future.   These are the goals we discussed:  Goals      Maintain Healthy Lifestyle     Stay active Stay hydrated  Healthy diet        This is a list of the screening recommended for you and due dates:  Health Maintenance  Topic Date Due   Flu Shot  02/24/2021*   Tetanus Vaccine  09/12/2021*   Mammogram  02/14/2021   Colon Cancer Screening  10/19/2025   DEXA scan (bone density measurement)  Completed   COVID-19 Vaccine  Completed   Hepatitis C Screening: USPSTF Recommendation to screen - Ages 39-79 yo.  Completed   Pneumonia vaccines  Completed   Zoster (Shingles) Vaccine  Completed   HPV Vaccine  Aged Out  *Topic was postponed. The date shown is not the original due date.

## 2021-01-14 ENCOUNTER — Other Ambulatory Visit: Payer: Self-pay

## 2021-01-14 ENCOUNTER — Ambulatory Visit: Payer: Medicare Other | Admitting: Endocrinology

## 2021-01-14 VITALS — BP 132/70 | HR 89 | Ht 65.0 in | Wt 151.4 lb

## 2021-01-14 DIAGNOSIS — E278 Other specified disorders of adrenal gland: Secondary | ICD-10-CM

## 2021-01-14 NOTE — Progress Notes (Signed)
Subjective:    Patient ID: Abigail Willis, female    DOB: 1945-07-12, 75 y.o.   MRN: [id number]  HPI Pt returns for f/u of adrenal enlargement (dx'ed 2021; eval for HPA hyper- and hypofunction was neg).  pt states she feels well in general.   Past Medical History:  Diagnosis Date   Allergy    SINUS   Cancer ([facility])    skin   Cataract    bilateral   Colon polyps 11/14/2013   Tubular adenoma, hyperplastic polyps   COPD (chronic obstructive pulmonary disease) (HCC)    Diverticulosis 11/15/2010   Moderate   External hemorrhoids    Hyperlipidemia    IBS (irritable bowel syndrome)    Osteoporosis    Squamous cell carcinoma in situ    Vertigo     Past Surgical History:  Procedure Laterality Date   BREAST BIOPSY     COLONOSCOPY     GANGLION CYST EXCISION     HAMMER TOE SURGERY     HEMORRHOID BANDING     LEEP      twice, in [place]  2019   TONSILLECTOMY     TUBAL LIGATION      Social History   Socioeconomic History   Marital status: Married    Spouse name: Not on file   Number of children: 1   Years of education: Not on file   Highest education level: Not on file  Occupational History   Occupation: Retired  Tobacco Use   Smoking status: Every Day    Packs/day: 1.00    Years: 52.00    Pack years: 52.00    Types: Cigarettes   Smokeless tobacco: Never  Vaping Use   Vaping Use: Never used  Substance and Sexual Activity   Alcohol use: Yes    Alcohol/week: 7.0 standard drinks    Types: 7 Glasses of wine per week    Comment: SOCIAL   Drug use: No   Sexual activity: Yes  Other Topics Concern   Not on file  Social History Narrative   Moved from [place] to be closer to her daughter, spends winters at [facility] home.      Has HPOA- husband Cletus Gash.   Would desire feeding tube, no prolonged life support.   Not sure about feeding tubes.            Social Determinants of Health   Financial Resource Strain: Low Risk     Difficulty of Paying Living Expenses: Not hard at all  Food Insecurity: No Food Insecurity   Worried About Charity fundraiser in the Last Year: Never true   [place] in the Last Year: Never true  Transportation Needs: No Transportation Needs   Lack of Transportation (Medical): No   Lack of Transportation (Non-Medical): No  Physical Activity: Not on file  Stress: No Stress Concern Present   Feeling of Stress : Not at all  Social Connections: Unknown   Frequency of Communication with Friends and Family: More than three times a week   Frequency of Social Gatherings with Friends and Family: More than three times a week   Attends Religious Services: Not on Electrical engineer or Organizations: Yes   Attends Music therapist: More than 4 times per year   Marital Status: Married  Human resources officer Violence: Not At Risk   Fear of Current or Ex-Partner: No   Emotionally Abused: No  Physically Abused: No   Sexually Abused: No    Current Outpatient Medications on File Prior to Visit  Medication Sig Dispense Refill   acetaminophen (TYLENOL) 500 MG tablet Take 500 mg by mouth as needed.     aspirin 81 MG tablet Take 81 mg by mouth daily.     cyanocobalamin (,VITAMIN B-12,) 1000 MCG/ML injection Inject 1,000 mcg into the muscle once.     hyoscyamine (LEVBID) 0.375 MG 12 hr tablet Take 1 tablet (0.375 mg total) by mouth every 12 (twelve) hours as needed. 60 tablet 6   ibuprofen (ADVIL,MOTRIN) 200 MG tablet Take 200 mg by mouth as needed.     loperamide (IMODIUM A-D) 2 MG tablet Take 1 tablet (2 mg total) by mouth in the morning. 30 tablet 0   pravastatin (PRAVACHOL) 10 MG tablet Take 1 tablet (10 mg total) by mouth every evening. 90 tablet 3   pseudoephedrine-guaifenesin (MUCINEX D) 60-600 MG per tablet Take 1 tablet by mouth every 12 (twelve) hours as needed.     sodium chloride (OCEAN) 0.65 % SOLN nasal spray Place 1 spray into both nostrils as needed for  congestion.     No current facility-administered medications on file prior to visit.    Allergies  Allergen Reactions   Avelox [Moxifloxacin Hcl In Nacl] Other (See Comments)    Severe stomach cramping and nausea   Penicillins Rash    Family History  Problem Relation Age of Onset   Heart attack Mother 30   Ovarian cancer Mother    Asthma Father    Brain cancer Sister    Colon cancer Cousin    Breast cancer Neg Hx    Adrenal disorder Neg Hx    Esophageal cancer Neg Hx    Rectal cancer Neg Hx    Stomach cancer Neg Hx     BP 132/70 (BP Location: Right Arm, Patient Position: Sitting, Cuff Size: Normal)   Pulse 89   Ht '5\' 5"'$  (1.651 m)   Wt 151 lb 6.4 oz (68.7 kg)   SpO2 94%   BMI 25.19 kg/m   Review of Systems     Objective:   Physical Exam      Assessment & Plan:  Adrenal hyperplasia, uncertain etiology.  Eval is neg so far.   Patient Instructions  Blood tests are requested for you today.  We'll let you know about the results.  If these are normal, no further testing of the adrenal glands is needed. I would be happy to see you back here as needed.

## 2021-01-14 NOTE — Patient Instructions (Addendum)
Blood tests are requested for you today.  We'll let you know about the results.  If these are normal, no further testing of the adrenal glands is needed. I would be happy to see you back here as needed.

## 2021-01-17 ENCOUNTER — Other Ambulatory Visit: Payer: Self-pay | Admitting: Endocrinology

## 2021-01-17 ENCOUNTER — Encounter: Payer: Self-pay | Admitting: Endocrinology

## 2021-01-17 DIAGNOSIS — E278 Other specified disorders of adrenal gland: Secondary | ICD-10-CM

## 2021-01-18 NOTE — Telephone Encounter (Signed)
I received a phone call from [facility] regarding visit on date of service 10/17/2020.  They have denied CPE claim and they are asking to resubmit claim.  I am confused by this.    Would you please call the customer service #[phone] to get further information and how we can rectify this?

## 2021-01-19 LAB — METANEPHRINES, PLASMA
Metanephrine, Free: 80 pg/mL — ABNORMAL HIGH (ref <=57)
Normetanephrine, Free: 287 pg/mL — ABNORMAL HIGH (ref <=148)
Total Metanephrines-Plasma: 367 pg/mL — ABNORMAL HIGH (ref <=205)

## 2021-01-19 LAB — ALDOSTERONE + RENIN ACTIVITY W/ RATIO
ALDO / PRA Ratio: 8.5 Ratio (ref 0.9–28.9)
Aldosterone: 12 ng/dL
Renin Activity: 1.42 ng/mL/h (ref 0.25–5.82)

## 2021-01-19 LAB — CATECHOLAMINES, FRACTIONATED, PLASMA
Dopamine: <40 pg/mL
Epinephrine: <80 pg/mL
Norepinephrine: 653 pg/mL
Total Catecholamines: 653 pg/mL

## 2021-01-19 NOTE — Progress Notes (Signed)
Please call patient and let them  know their  low dose Ct was read as a Lung RADS 2: nodules that are benign in appearance and behavior with a very low likelihood of becoming a clinically active cancer due to size or lack of growth. Recommendation per radiology is for a repeat LDCT in 12 months. .Please let them  know we will order and schedule their  annual screening scan for  12/2021. Please let them  know there was notation of CAD on their  scan.  Please remind the patient  that this is a non-gated exam therefore degree or severity of disease  cannot be determined. Please have them  follow up with their PCP regarding potential risk factor modification, dietary therapy or pharmacologic therapy if clinically indicated. Pt.  is  currently on statin therapy. Please place order for annual  screening scan for  12/2021 and fax results to PCP. Thanks so much.  + CAD, on statin, follow with cards. Have them folow up with PCP or cardiologist. Thanks so much

## 2021-01-22 ENCOUNTER — Encounter: Payer: Self-pay | Admitting: *Deleted

## 2021-01-22 ENCOUNTER — Other Ambulatory Visit: Payer: Self-pay

## 2021-01-22 ENCOUNTER — Ambulatory Visit (INDEPENDENT_AMBULATORY_CARE_PROVIDER_SITE_OTHER): Payer: Medicare Other

## 2021-01-22 DIAGNOSIS — F17210 Nicotine dependence, cigarettes, uncomplicated: Secondary | ICD-10-CM

## 2021-01-22 DIAGNOSIS — E538 Deficiency of other specified B group vitamins: Secondary | ICD-10-CM | POA: Diagnosis not present

## 2021-01-22 DIAGNOSIS — Z87891 Personal history of nicotine dependence: Secondary | ICD-10-CM

## 2021-01-22 MED ORDER — CYANOCOBALAMIN 1000 MCG/ML IJ SOLN
1000.0000 ug | Freq: Once | INTRAMUSCULAR | Status: AC
  Administered 2021-01-22: 1000 ug via INTRAMUSCULAR

## 2021-01-22 NOTE — Progress Notes (Signed)
Patient presented for B 12 injection to left deltoid, patient voiced no concerns nor showed any signs of distress during injection. 

## 2021-01-22 NOTE — Telephone Encounter (Signed)
24 hour urine jar has been given to patient. Pt is going on a trip and will not be able to complete the 24 hour sample until the next week. Pt was instructed to contact Dr. Cordelia Pen office for more instructions.

## 2021-01-27 ENCOUNTER — Telehealth: Payer: Self-pay | Admitting: Family

## 2021-01-27 DIAGNOSIS — E278 Other specified disorders of adrenal gland: Secondary | ICD-10-CM

## 2021-01-27 DIAGNOSIS — I700 Atherosclerosis of aorta: Secondary | ICD-10-CM

## 2021-01-27 NOTE — Telephone Encounter (Signed)
Call pt  Annual CT lung cancer screening was benign for lung cancer  however she will need repeat in 12 months.  Significant for coronary arthrosclerosis, and bilateral adrenal adenomas.  I know you are undergoing work-up with endocrinology, Dr. Loanne Drilling as it relates to this.  Please continue to follow with him.  As it relates to the cholesterol surrounding your heart, I would again recommend a consult with cardiology.  There are very sophisticated test that we use now such as a CT calcium score that are noninvasive and further stratify cardiovascular risk.  I think you would greatly benefit from this consult. Please let me know if you would reconsider this consult

## 2021-01-28 NOTE — Telephone Encounter (Signed)
LMTCB

## 2021-01-28 NOTE — Telephone Encounter (Signed)
I called patient & she Abigail Willis following with Dr. Loanne Drilling. She stated that she is willing to consult cardiology & did see Dr. Saunders Revel in 2020, but only once. Will you refer patient back?

## 2021-01-29 ENCOUNTER — Ambulatory Visit: Payer: Medicare Other | Admitting: Gastroenterology

## 2021-01-29 ENCOUNTER — Encounter: Payer: Self-pay | Admitting: Gastroenterology

## 2021-01-29 VITALS — BP 110/60 | HR 74 | Ht 61.0 in | Wt 152.6 lb

## 2021-01-29 DIAGNOSIS — K580 Irritable bowel syndrome with diarrhea: Secondary | ICD-10-CM | POA: Diagnosis not present

## 2021-01-29 NOTE — Progress Notes (Signed)
Review of pertinent gastrointestinal problems: 1. Adenomatous polyps:  Colonoscopy 10/2010 Dr. Ardis Hughs (for previous adenomatous polyps in [facility] including a 1cm pedunculated polyp in 1998) 4 subCM polyps removed, one was adenomatous, She was recommended to have repeat colonoscopy at 5 year interval.    Colonoscopy September 2017 Dr. Ardis Hughs found 3 subcentimeter polyps.  Also internal and external hemorrhoids.  2 of these polyps were sessile serrated adenomas, the other was hyperplastic.  Colonoscopy June 2022 normal terminal ileum, the entire colon appeared slightly erythematous.  Biopsies were taken from right and left colon.  Hemorrhoids were found, medium size.  No polyps or cancers.  Biopsies showed no sign of micro or macro scopic colitis 2. IBS: with abd cramping; probiotics and PRN levbid has helped. 3.  Internal and external hemorrhoids: Internal Hemorrhoid banding procedures May 2018.  HPI: This is a very pleasant 75 year old woman whom I last saw at the time of a colonoscopy 3 months ago.  See those results summarized above  She tends to have loose stools worsened around times of stress.  She takes Levbid and Imodium both on as-needed basis and is very happy with the results of those when she needs them.   ROS: complete GI ROS as described in HPI, all other review negative.  Constitutional:  No unintentional weight loss   Past Medical History:  Diagnosis Date   Allergy    SINUS   Cancer ([place])    skin   Cataract    bilateral   Colon polyps 11/14/2013   Tubular adenoma, hyperplastic polyps   COPD (chronic obstructive pulmonary disease) (HCC)    Diverticulosis 11/15/2010   Moderate   External hemorrhoids    Hyperlipidemia    IBS (irritable bowel syndrome)    Osteoporosis    Squamous cell carcinoma in situ    Vertigo     Past Surgical History:  Procedure Laterality Date   BREAST BIOPSY     COLONOSCOPY     GANGLION CYST EXCISION     HAMMER TOE SURGERY     HEMORRHOID  BANDING     LEEP      twice, in [place]  2019   TONSILLECTOMY     TUBAL LIGATION      Current Outpatient Medications  Medication Sig Dispense Refill   acetaminophen (TYLENOL) 500 MG tablet Take 500 mg by mouth as needed.     aspirin 81 MG tablet Take 81 mg by mouth daily.     cyanocobalamin (,VITAMIN B-12,) 1000 MCG/ML injection Inject 1,000 mcg into the muscle once.     hyoscyamine (LEVBID) 0.375 MG 12 hr tablet Take 1 tablet (0.375 mg total) by mouth every 12 (twelve) hours as needed. 60 tablet 6   ibuprofen (ADVIL,MOTRIN) 200 MG tablet Take 200 mg by mouth as needed.     loperamide (IMODIUM A-D) 2 MG tablet Take 1 tablet (2 mg total) by mouth in the morning. 30 tablet 0   pravastatin (PRAVACHOL) 10 MG tablet Take 1 tablet (10 mg total) by mouth every evening. 90 tablet 3   pseudoephedrine-guaifenesin (MUCINEX D) 60-600 MG per tablet Take 1 tablet by mouth every 12 (twelve) hours as needed.     sodium chloride (OCEAN) 0.65 % SOLN nasal spray Place 1 spray into both nostrils as needed for congestion.     No current facility-administered medications for this visit.    Allergies as of 01/29/2021 - Review Complete 01/29/2021  Allergen Reaction Noted   Avelox [moxifloxacin hcl  in nacl] Other (See Comments) 03/16/2012   Penicillins Rash 03/06/2010    Family History  Problem Relation Age of Onset   Heart attack Mother 59   Ovarian cancer Mother    Asthma Father    Brain cancer Sister    Colon cancer Cousin    Breast cancer Neg Hx    Adrenal disorder Neg Hx    Esophageal cancer Neg Hx    Rectal cancer Neg Hx    Stomach cancer Neg Hx     Social History   Socioeconomic History   Marital status: Married    Spouse name: Not on file   Number of children: 1   Years of education: Not on file   Highest education level: Not on file  Occupational History   Occupation: Retired  Tobacco Use   Smoking status: Every Day    Packs/day: 1.00    Years: 52.00    Pack  years: 52.00    Types: Cigarettes   Smokeless tobacco: Never  Vaping Use   Vaping Use: Never used  Substance and Sexual Activity   Alcohol use: Yes    Alcohol/week: 7.0 standard drinks    Types: 7 Glasses of wine per week    Comment: SOCIAL   Drug use: No   Sexual activity: Yes  Other Topics Concern   Not on file  Social History Narrative   Moved from [place] to be closer to her daughter, spends winters at [facility] home.      Has HPOA- husband Cletus Gash.   Would desire feeding tube, no prolonged life support.   Not sure about feeding tubes.            Social Determinants of Health   Financial Resource Strain: Low Risk    Difficulty of Paying Living Expenses: Not hard at all  Food Insecurity: No Food Insecurity   Worried About Charity fundraiser in the Last Year: Never true   [place] in the Last Year: Never true  Transportation Needs: No Transportation Needs   Lack of Transportation (Medical): No   Lack of Transportation (Non-Medical): No  Physical Activity: Not on file  Stress: No Stress Concern Present   Feeling of Stress : Not at all  Social Connections: Unknown   Frequency of Communication with Friends and Family: More than three times a week   Frequency of Social Gatherings with Friends and Family: More than three times a week   Attends Religious Services: Not on Electrical engineer or Organizations: Yes   Attends Music therapist: More than 4 times per year   Marital Status: Married  Human resources officer Violence: Not At Risk   Fear of Current or Ex-Partner: No   Emotionally Abused: No   Physically Abused: No   Sexually Abused: No     Physical Exam: BP 110/60   Pulse 74   Ht '5\' 1"'$  (1.549 m)   Wt 152 lb 9.6 oz (69.2 kg)   BMI 28.83 kg/m  Constitutional: generally well-appearing Psychiatric: alert and oriented x3 Abdomen: soft, nontender, nondistended, no obvious ascites, no peritoneal signs, normal bowel sounds No  peripheral edema noted in lower extremities  Assessment and plan: 75 y.o. female with personal history of colon polyps, IBS-D  She knows it is completely safe to take her antispasmodic medicine and her Imodium both on an as-needed basis.  She also knows that given her most recent colonoscopy 3 months ago without any  polyps found she is probably done with routine screening, surveillance examinations.  Certainly if she has any new concerning GI symptoms she should call.  Please see the "Patient Instructions" section for addition details about the plan.  Owens Loffler, MD [facility] 01/29/2021, 2:49 PM   Total time on date of encounter was 25 minutes (this included time spent preparing to see the patient reviewing records; obtaining and/or reviewing separately obtained history; performing a medically appropriate exam and/or evaluation; counseling and educating the patient and family if present; ordering medications, tests or procedures if applicable; and documenting clinical information in the health record).

## 2021-01-29 NOTE — Patient Instructions (Addendum)
If you are age 76 or older, your body mass index should be between 23-30. Your Body mass index is 28.83 kg/m. If this is out of the aforementioned range listed, please consider follow up with your Primary Care Provider. __________________________________________________________  The [facility] providers would like to encourage you to use [facility] to communicate with providers for non-urgent requests or questions.  Due to long hold times on the telephone, sending your provider a message by [facility] may be a faster and more efficient way to get a response.  Please allow 48 business hours for a response.  Please remember that this is for non-urgent requests.   You will follow up with our office on an as needed basis.  Thank you for entrusting me with your care and choosing [facility].  Dr Ardis Hughs

## 2021-01-30 ENCOUNTER — Encounter: Payer: Self-pay | Admitting: Family

## 2021-01-30 NOTE — Telephone Encounter (Signed)
Call patient I can see now that she saw Dr. Saunders Revel.  She had a stress test at that time with no significant ischemia.  It was a low risk scan.  .  At that time Dr end recommended LDL cholesterol to be less than 70 to reduce her overall cardiovascular risk and continue asa '81mg'$ .   Currently hers is 87 on pravastatin.    If agreeable, please increase pravastatin to 20 mg p.o. nightly.  Please order and schedule CMP and lipid panel in 6 weeks time  I think reasonable continue follow-up with Dr. Saunders Revel.  I placed referral to see him again  Let us know if you dont hear back within a week in regards to an appointment being scheduled.

## 2021-01-31 ENCOUNTER — Other Ambulatory Visit: Payer: Self-pay

## 2021-01-31 DIAGNOSIS — E785 Hyperlipidemia, unspecified: Secondary | ICD-10-CM

## 2021-01-31 MED ORDER — PRAVASTATIN SODIUM 20 MG PO TABS: 20.0000 mg | ORAL_TABLET | Freq: Every day | ORAL | 3 refills | Status: DC

## 2021-01-31 NOTE — Telephone Encounter (Signed)
See phone note

## 2021-01-31 NOTE — Telephone Encounter (Signed)
I spoke with patient & advised her on message. She was willing to increase pravastatin to 20 mg at bedtime. I will sent to pharmacy & she will take two of her 10 mg for now. She stated that for her lipid & CMP that she would be back in [place] in 6 weeks. She stated that she knew there were [facility] drawing stations in [facility], & I have ordered labs for [facility]. I will mail requisitions after you sign in hopes this will suffice for her to have her labs drawn there. I do not know of a better way of doing this? Pt does plan to establish with a PCP there, so that she does not have issues with getting care there as well.

## 2021-02-01 NOTE — Telephone Encounter (Signed)
Noted Signed labs

## 2021-02-04 ENCOUNTER — Encounter: Payer: Self-pay | Admitting: Endocrinology

## 2021-02-04 ENCOUNTER — Other Ambulatory Visit: Payer: Self-pay | Admitting: Family

## 2021-02-07 ENCOUNTER — Telehealth: Payer: Self-pay | Admitting: Family

## 2021-02-07 NOTE — Telephone Encounter (Signed)
There was no account number on the original order. However, when I called [facility] they had already resolved this matter. There is nothing that needs to be done & test is still pending & will result to Dr. Loanne Drilling

## 2021-02-07 NOTE — Telephone Encounter (Signed)
Abigail Willis,  I received a very strange "house tracking" lab result regarding this patient.  I suspect this is from a urinary test Catecholamines, fractionated, urine, 24 hour  - which was ordered by endocrinology and patient was intending to do it via our office from Dr Loanne Drilling.    What do I need to do here?   [name], please let pt know what toya, jill say. Advise her that she has a f/u with Dr Loanne Drilling. I do not see in chart

## 2021-02-08 ENCOUNTER — Encounter: Payer: Self-pay | Admitting: Family

## 2021-02-11 LAB — CATECHOLAMINES, FRACTIONATED, URINE, 24 HOUR
Creatinine, Urine mg/day-CATEUR: 0.5 g/(24.h) (ref 0.50–2.15)
Total Volume: 1100 mL

## 2021-02-11 LAB — HOUSE ACCOUNT TRACKING

## 2021-02-11 NOTE — Telephone Encounter (Signed)
noted 

## 2021-02-12 ENCOUNTER — Other Ambulatory Visit: Payer: Self-pay

## 2021-02-12 ENCOUNTER — Ambulatory Visit: Payer: Medicare Other | Admitting: Physician Assistant

## 2021-02-12 ENCOUNTER — Encounter: Payer: Self-pay | Admitting: Physician Assistant

## 2021-02-12 ENCOUNTER — Inpatient Hospital Stay: Payer: Medicare Other | Attending: Internal Medicine

## 2021-02-12 VITALS — BP 150/60 | HR 84 | Ht 61.0 in | Wt 152.5 lb

## 2021-02-12 DIAGNOSIS — E785 Hyperlipidemia, unspecified: Secondary | ICD-10-CM

## 2021-02-12 DIAGNOSIS — D751 Secondary polycythemia: Secondary | ICD-10-CM

## 2021-02-12 DIAGNOSIS — I2510 Atherosclerotic heart disease of native coronary artery without angina pectoris: Secondary | ICD-10-CM | POA: Diagnosis not present

## 2021-02-12 DIAGNOSIS — E538 Deficiency of other specified B group vitamins: Secondary | ICD-10-CM | POA: Insufficient documentation

## 2021-02-12 DIAGNOSIS — I700 Atherosclerosis of aorta: Secondary | ICD-10-CM

## 2021-02-12 DIAGNOSIS — Z720 Tobacco use: Secondary | ICD-10-CM

## 2021-02-12 DIAGNOSIS — R0600 Dyspnea, unspecified: Secondary | ICD-10-CM | POA: Diagnosis not present

## 2021-02-12 LAB — CBC WITH DIFFERENTIAL/PLATELET
Abs Immature Granulocytes: 0.03 10*3/uL (ref 0.00–0.07)
Basophils Absolute: 0 10*3/uL (ref 0.0–0.1)
Basophils Relative: 1 %
Eosinophils Absolute: 0.1 10*3/uL (ref 0.0–0.5)
Eosinophils Relative: 1 %
HCT: 45.3 % (ref 36.0–46.0)
Hemoglobin: 15.3 g/dL — ABNORMAL HIGH (ref 12.0–15.0)
Immature Granulocytes: 0 %
Lymphocytes Relative: 26 %
Lymphs Abs: 2.1 10*3/uL (ref 0.7–4.0)
MCH: 30.7 pg (ref 26.0–34.0)
MCHC: 33.8 g/dL (ref 30.0–36.0)
MCV: 91 fL (ref 80.0–100.0)
Monocytes Absolute: 0.7 10*3/uL (ref 0.1–1.0)
Monocytes Relative: 8 %
Neutro Abs: 5 10*3/uL (ref 1.7–7.7)
Neutrophils Relative %: 64 %
Platelets: 282 10*3/uL (ref 150–400)
RBC: 4.98 MIL/uL (ref 3.87–5.11)
RDW: 13.2 % (ref 11.5–15.5)
WBC: 7.9 10*3/uL (ref 4.0–10.5)
nRBC: 0 % (ref 0.0–0.2)

## 2021-02-12 MED ORDER — METOPROLOL TARTRATE 100 MG PO TABS: ORAL_TABLET | ORAL | 0 refills | Status: DC

## 2021-02-12 NOTE — Progress Notes (Signed)
Office Visit    Patient Name: Abigail Willis [facility] Date of Encounter: 02/12/2021  PCP:  Burnard Hawthorne, [place]  Cardiologist:  Dr. Saunders Revel Advanced Practice Provider:  No care team member to display Electrophysiologist:  None :[id number]}   Chief Complaint    Chief Complaint  Patient presents with   Other    Aortic atherosclerosis. Meds reviewed verbally with pt.    75 y.o. female with history of previous atypical chest pain, aortic/coronary artery atherosclerosis seen on CT, hyperlipidemia, IBS, current tobacco use, adrenal adenoma seen on CT, and here today for follow-up of 12/30/2018 visit with patient clarification provided that she was referred by her PCP due to coronary calcification and aortic atherosclerosis seen on recent CT.  Past Medical History    Past Medical History:  Diagnosis Date   Allergy    SINUS   Cancer ([place])    skin   Cataract    bilateral   Colon polyps 11/14/2013   Tubular adenoma, hyperplastic polyps   COPD (chronic obstructive pulmonary disease) (HCC)    Diverticulosis 11/15/2010   Moderate   External hemorrhoids    Hyperlipidemia    IBS (irritable bowel syndrome)    Osteoporosis    Squamous cell carcinoma in situ    Vertigo    Past Surgical History:  Procedure Laterality Date   BREAST BIOPSY     COLONOSCOPY     GANGLION CYST EXCISION     HAMMER TOE SURGERY     HEMORRHOID BANDING     LEEP      twice, in [place]  2019   TONSILLECTOMY     TUBAL LIGATION      Allergies  Allergies  Allergen Reactions   Avelox [Moxifloxacin Hcl In Nacl] Other (See Comments)    Severe stomach cramping and nausea   Penicillins Rash    History of Present Illness    Abigail Willis is a 75 y.o. female with PMH as above.   She was last seen 12/30/2018 by her primary cardiologist for chest pain with history of aortic atherosclerosis, hyperlipidemia, IBS, and tobacco use.  She had a  recent emergency department visit 12/14/2018.  She had 15 to 20 minutes of left-sided chest pain that morning with EKG and high-sensitivity troponin unrevealing.  Lung cancer screening was notable for coronary and aortic atherosclerosis.  When seen in clinic, she reported sharp pain under the left breast and radiating to the left upper arm after getting of the shower.  She had associated diaphoresis.  This resolved after 15 minutes.  She felt her heart racing, attributed to anxiety.  She reported on and off occasional gas pain in her chest since her emergency department visit but nothing like what she experienced 7/28.  She reported a plan to quit smoking.  MPI performed and ruled low risk with medication management recommended.  She was instructed to follow-up as needed.  She has not needed follow-up since that time.  She returns today per recommendation of PCP due to atherosclerosis of aorta and coronary arteries as seen on CT.  She states today that her PCP told her to ask about a coronary calcium score or cardiac CTA.  She denies any recurrent chest pain but does note some shortness of breath with very heavy activity.  For example, she will feel short of breath when doing lots of steps and usually needs to rest for at least 10  minutes before returning to baseline.  She does not have a regular workout routine, and does admit to some deconditioning.  She reports family history includes both grandmother and mother with heart attack.  Her mother was 24 at the age of her heart attack.  She is still smoking three quarters to a pack of cigarettes per day.  She recently had a scan that showed adrenal adenomas and wonders about these test results with recommendation that she follow-up with her PCP, as these test results appear to have been delayed due to improper sample.  At times, she feels her heart race, usually with heavy activity.  She denies presyncope or syncope.    No diaphoresis.  She continues to note IBS,  especially bad in the morning.  She reports that her statin was recently increased approximately 1 to 2 weeks ago for risk factor modification.  She reports that she is about to go to [place] for several months and requests that all further work-up be deferred until she gets back from [place].  Home Medications   Current Outpatient Medications  Medication Instructions   acetaminophen (TYLENOL) 500 mg, Oral, As needed   aspirin 81 mg, Oral, Daily,     cyanocobalamin ((VITAMIN B-12)) 1,000 mcg, Intramuscular,  Once   hyoscyamine (LEVBID) 0.375 mg, Oral, Every 12 hours PRN   ibuprofen (ADVIL) 200 mg, Oral, As needed,     loperamide (IMODIUM A-D) 2 mg, Oral, Every morning   pravastatin (PRAVACHOL) 20 mg, Oral, Daily at bedtime   pseudoephedrine-guaifenesin (MUCINEX D) 60-600 MG per tablet 1 tablet, Oral, Every 12 hours PRN,     sodium chloride (OCEAN) 0.65 % SOLN nasal spray 1 spray, Each Nare, As needed     Review of Systems    She denies chest pain, pnd, orthopnea, n, v, dizziness, syncope, edema, weight gain, or early satiety.  She reports dyspnea with stairs/heavy activity, requiring 10 minutes of rest before returning to baseline.  She reports IBS.  She occasionally has racing heart rate.   All other systems reviewed and are otherwise negative except as noted above.  Physical Exam    VS:  BP (!) 150/60 (BP Location: Left Arm, Patient Position: Sitting, Cuff Size: Normal)   Pulse 84   Ht 5\' 1"  (1.549 m)   Wt 152 lb 8 oz (69.2 kg)   SpO2 98%   BMI 28.81 kg/m  , BMI Body mass index is 28.81 kg/m. GEN: Well nourished, well developed, in no acute distress. HEENT: normal. Neck: Supple, no JVD, carotid bruits, or masses. Cardiac: RRR, no murmurs, rubs, or gallops. No clubbing, cyanosis, edema.  Radials/DP/PT 2+ and equal bilaterally.  Respiratory:  Respirations regular and unlabored, clear to auscultation bilaterally. GI: Soft, nontender, nondistended, BS + x 4. MS: no deformity or  atrophy. Skin: warm and dry, no rash. Neuro:  Strength and sensation are intact. Psych: Normal affect.  Accessory Clinical Findings    ECG personally reviewed by me today -NSR, 84 bpm- no acute changes.  VITALS Reviewed today   Temp Readings from Last 3 Encounters:  10/19/20 (!) 96.9 F (36.1 C)  10/17/20 98.6 F (37 C) (Oral)  09/25/20 98.7 F (37.1 C) (Oral)   BP Readings from Last 3 Encounters:  02/12/21 (!) 150/60  01/29/21 110/60  01/14/21 132/70   Pulse Readings from Last 3 Encounters:  02/12/21 84  01/29/21 74  01/14/21 89    Wt Readings from Last 3 Encounters:  02/12/21 152 lb 8 oz (69.2 kg)  01/29/21 152 lb 9.6 oz (69.2 kg)  01/14/21 151 lb 6.4 oz (68.7 kg)     LABS  reviewed today   Lab Results  Component Value Date   WBC 8.6 09/25/2020   HGB 16.1 (H) 09/25/2020   HCT 48.9 (H) 09/25/2020   MCV 92.4 09/25/2020   PLT 264 09/25/2020   Lab Results  Component Value Date   CREATININE 0.57 11/29/2019   BUN 9 11/29/2019   NA 139 11/29/2019   K 4.2 11/29/2019   CL 104 11/29/2019   CO2 28 11/29/2019   Lab Results  Component Value Date   ALT 11 11/29/2019   AST 16 11/29/2019   ALKPHOS 70 11/29/2019   BILITOT 0.4 11/29/2019   Lab Results  Component Value Date   CHOL 196 09/12/2020   HDL 79.40 09/12/2020   LDLCALC 87 09/12/2020   LDLDIRECT 116.2 03/14/2011   TRIG 150.0 (H) 09/12/2020   CHOLHDL 2 09/12/2020    Lab Results  Component Value Date   HGBA1C 5.6 11/29/2019   Lab Results  Component Value Date   TSH 2.39 11/29/2019     STUDIES/PROCEDURES reviewed today   MPI 12/2018 Pharmacological myocardial perfusion imaging study with no significant  ischemia Normal wall motion, EF estimated at 79% No EKG changes concerning for ischemia at peak stress or in recovery. Low risk scan   Assessment & Plan    Coronary artery calcification/aortic calcification --Denies any chest pain.  Referred to our office due to recent chest CT showing  coronary atherosclerosis and aortic atherosclerosis.  EKG without concerning ST/T changes.  Does report dyspnea with stairs and heavier activity.  Diaphanous of breath at rest.  No regular workout routine.  We discussed that her symptoms could be due to deconditioning.  She states that her PCP has requested a coronary calcium score for cardiac CT.  She is about to go to [place] and thus wants to defer the scan until after she gets back from [place].  We will plan for cardiac cCTA/BMET at that time.  She has several risk factors for CAD including known coronary artery calcification and tobacco use, hyperlipidemia, age, family history, as well as sedentary lifestyle.  Recommend continue medical management/aggressive risk factor modification.  Continue ASA and statin.  Smoking cessation recommended.  HLD --Recommend LDL control below 70.  Continue current pravastatin 20 mg at bedtime.  Essential HTN -- Reports BP usually well controlled.  On review of EMR, BP does seem controlled with recommendation she monitor her BP at home and call the office if consistently greater than 130/80.  We will defer medication changes at this time.  Reviewed recommendation for total daily fluids under 2 L/day and salt under 2 g/day.  Encourage BP log. Avoid ibuprofen.  Tobacco use -- Complete cessation recommended.  Most recent lung cancer screening showed lung RADS 2 with benign appearance or behavior.  Continue chest CT every 12 months is recommended.  Bilateral adrenal adenomas --Recommend work-up per PCP/endocrinology as indicated.  Encourage patient to reach out to her PCP regarding the results of her recent studies, as they came up as inadequate samples.  Consider is contributing to her racing heart rate.  Medication changes: None.  Beta-blocker before CTA.  Reassess BP at follow-up with recommendation for antihypertensives at that time if BP elevated. Labs ordered: BMET before CTA Studies / Imaging ordered: Coronary  CTA Disposition: RTC after coronary CTA  *Please be aware that the above documentation was completed voice recognition software and may  contain dictation errors.       Arvil Chaco, PA-C 02/12/2021

## 2021-02-12 NOTE — Patient Instructions (Addendum)
Medication Instructions:  Your physician recommends that you continue on your current medications as directed. Please refer to the Current Medication list given to you today.   A one time dose of Metoprolol 100 mg tablet to be taken 2 hours prior to your Cardiac CTA has been sent to your pharmacy.  *If you need a refill on your cardiac medications before your next appointment, please call your pharmacy*   Lab Work: None ordered  If you have labs (blood work) drawn today and your tests are completely normal, you will receive your results only by: [place] (if you have MyChart) OR A paper copy in the mail If you have any lab test that is abnormal or we need to change your treatment, we will call you to review the results.   Testing/Procedures: Your physician has requested that you have cardiac CT. Cardiac computed tomography (CT) is a painless test that uses an x-ray machine to take clear, detailed pictures of your heart. For further information please visit [email or web address]. Please follow instruction sheet as given.  We recommend a coronary CT when you get back from [place].    Follow-Up: At [facility], you and your health needs are our priority.  As part of our continuing mission to provide you with exceptional heart care, we have created designated Provider Care Teams.  These Care Teams include your primary Cardiologist (physician) and Advanced Practice Providers (APPs -  Physician Assistants and Nurse Practitioners) who all work together to provide you with the care you need, when you need it.  We recommend signing up for the patient portal called "MyChart".  Sign up information is provided on this After Visit Summary.  MyChart is used to connect with patients for Virtual Visits (Telemedicine).  Patients are able to view lab/test results, encounter notes, upcoming appointments, etc.  Non-urgent messages can be sent to your provider as well.   To learn more about what you  can do with MyChart, go to [email or web address].    Your next appointment:   Pending Coronary CTA results  The format for your next appointment:   In Person  Provider:   You may see Dr. Saunders Revel or one of the following Advanced Practice Providers on your designated Care Team:   Murray Hodgkins, NP Christell Faith, PA-C Marrianne Mood, PA-C Cadence Kathlen Mody, [place]   Other Instructions   Your cardiac CT will be scheduled at one of the below locations:   [facility] [address] [place], [place] [address] [phone]  [place] [address] [place], [place] [address] [phone]  If scheduled at [facility], please arrive at the [facility] main entrance (entrance A) of [facility] 30 minutes prior to test start time. Proceed to the [facility] Department (first floor) to check-in and test prep.  If scheduled at [facility], please arrive 15 mins early for check-in and test prep.  Please follow these instructions carefully (unless otherwise directed):    On the Night Before the Test: Be sure to Drink plenty of water. Do not consume any caffeinated/decaffeinated beverages or chocolate 12 hours prior to your test. Do not take any antihistamines 12 hours prior to your test.  On the Day of the Test: Drink plenty of water until 1 hour prior to the test. Do not eat any food 4 hours prior to the test. You may take your regular medications prior to the test.  Take  metoprolol (Lopressor) two hours prior to test.      After the Test: Drink plenty of water. After receiving IV contrast, you may experience a mild flushed feeling. This is normal. On occasion, you may experience a mild rash up to 24 hours after the test. This is not dangerous. If this occurs, you can take Benadryl 25 mg and increase your fluid intake. If you experience trouble breathing, this can be  serious. If it is severe call 911 IMMEDIATELY. If it is mild, please call our office. If you take any of these medications: Glipizide/Metformin, Avandament, Glucavance, please do not take 48 hours after completing test unless otherwise instructed.  Please allow 2-4 weeks for scheduling of routine cardiac CTs. Some insurance companies require a pre-authorization which may delay scheduling of this test.   For non-scheduling related questions, please contact the cardiac imaging nurse navigator should you have any questions/concerns: Marchia Bond, Cardiac Imaging Nurse Navigator Gordy Clement, Cardiac Imaging Nurse Navigator [facility] Direct Office Dial: [phone]   For scheduling needs, including cancellations and rescheduling, please call [place], [phone].

## 2021-02-13 ENCOUNTER — Other Ambulatory Visit: Payer: Self-pay | Admitting: Endocrinology

## 2021-02-13 ENCOUNTER — Other Ambulatory Visit: Payer: Medicare Other

## 2021-02-13 DIAGNOSIS — Q891 Congenital malformations of adrenal gland: Secondary | ICD-10-CM

## 2021-02-15 ENCOUNTER — Other Ambulatory Visit: Payer: Self-pay

## 2021-02-15 ENCOUNTER — Other Ambulatory Visit: Payer: Medicare Other

## 2021-02-15 ENCOUNTER — Ambulatory Visit
Admission: RE | Admit: 2021-02-15 | Discharge: 2021-02-15 | Disposition: A | Discharge status: Home / Self Care | Payer: Medicare Other | Source: Ambulatory Visit | Attending: Family | Admitting: Family

## 2021-02-15 DIAGNOSIS — Q891 Congenital malformations of adrenal gland: Secondary | ICD-10-CM

## 2021-02-15 DIAGNOSIS — Z1231 Encounter for screening mammogram for malignant neoplasm of breast: Secondary | ICD-10-CM

## 2021-02-19 LAB — EXTRA SPECIMEN

## 2021-02-19 LAB — METANEPHRINES, URINE, 24 HOUR
METANEPHRINE: 148 mcg/24 h (ref 90–315)
METANEPHRINES, TOTAL: 593 mcg/24 h (ref 224–832)
NORMETANEPHRINE: 445 mcg/24 h (ref 122–676)
Total Volume: 1200 mL

## 2021-02-22 ENCOUNTER — Telehealth: Payer: Self-pay | Admitting: Family

## 2021-02-22 ENCOUNTER — Other Ambulatory Visit: Payer: Self-pay

## 2021-02-22 ENCOUNTER — Ambulatory Visit (INDEPENDENT_AMBULATORY_CARE_PROVIDER_SITE_OTHER): Payer: Medicare Other

## 2021-02-22 DIAGNOSIS — E538 Deficiency of other specified B group vitamins: Secondary | ICD-10-CM | POA: Diagnosis not present

## 2021-02-22 MED ORDER — "BD DISP NEEDLES 25G X 5/8"" MISC": 0 refills | Status: DC

## 2021-02-22 MED ORDER — "BD ECLIPSE SYRINGE 25G X 1"" 3 ML MISC": 0 refills | Status: DC

## 2021-02-22 MED ORDER — CYANOCOBALAMIN 1000 MCG/ML IJ SOLN: INTRAMUSCULAR | 0 refills | Status: DC

## 2021-02-22 MED ORDER — CYANOCOBALAMIN 1000 MCG/ML IJ SOLN
1000.0000 ug | Freq: Once | INTRAMUSCULAR | Status: AC
  Administered 2021-02-22: 1000 ug via INTRAMUSCULAR

## 2021-02-22 NOTE — Progress Notes (Signed)
Patient presented for B 12 injection to left deltoid, patient voiced no concerns nor showed any signs of distress during injection. 

## 2021-02-22 NOTE — Telephone Encounter (Signed)
I called patient & she has a friend in [place] that is an EMT that can give her the monthly B-12 injections. I have sent to [facility] in [place] for patient.

## 2021-02-22 NOTE — Telephone Encounter (Signed)
Patient checking out from having B12 shot done today. States she will be unable to schedule her next B12 shot as she is leaving to [place] next week for 6 months.   Patient is wanting to know what to do about her B12 shots for the next 6 months?   Please advise

## 2021-03-14 ENCOUNTER — Encounter: Payer: Self-pay | Admitting: Family

## 2021-03-25 DIAGNOSIS — H60501 Unspecified acute noninfective otitis externa, right ear: Secondary | ICD-10-CM | POA: Insufficient documentation

## 2021-03-26 ENCOUNTER — Telehealth: Payer: Self-pay

## 2021-03-26 NOTE — Telephone Encounter (Signed)
Mychart message:  I went to [facility] for the blood draw this morning. They searched their files and could NOT find a fax order for me. Is it possible to you to email or mail me the correct completed forms that I could take with me when I reschedule?? This is getting frustrating as I have made appointments and fasted only to not be able to complete the process.   I will be going out of the country on Saturday, the 12th until Tuesday, the 29th and will have to schedule the appointment for when I return. If I know the forms are on their way to me, I can schedule the appointment now for my return date and get an early morning one.   Thank you

## 2021-03-29 ENCOUNTER — Telehealth: Payer: Self-pay

## 2021-03-29 NOTE — Telephone Encounter (Signed)
Abigail Willis, Abigail Willis  P [facility] (supporting Burnard Hawthorne, FNP) 28 minutes ago (8:52 AM)   Have you decided whether you will send me the order I need for the blood draw? I will be leaving later today and would like to make an appointment for when I return. My email is [email or web address] or [place] mailing address: [address], [place] [address].    Abigail Willis, Abigail Willis  P [facility] (supporting Burnard Hawthorne, FNP) 3 days ago   I went to [facility] for the blood draw this morning. They searched their files and could NOT find a fax order for me. Is it possible to you to email or mail me the correct completed forms that I could take with me when I reschedule?? This is getting frustrating as I have made appointments and fasted only to not be able to complete the process.   I will be going out of the country on Saturday, the 12th until Tuesday, the 29th and will have to schedule the appointment for when I return. If I know the forms are on their way to me, I can schedule the appointment now for my return date and get an early morning one.   Thank you    Pates, Rande Brunt, CMA  Abigail Willis, Abigail Willis 11 days ago   [place] too! Sometimes it is hard to coordinate these things. So sorry for the trouble.   Judson Roch, CMA

## 2021-04-01 NOTE — Telephone Encounter (Signed)
Pt has already left the country Saturday & not back to [place] until 29th. I understand her frustration just unsure why [facility] did not receive orders. I can fax again, but not sure what else can be done?  We had sent over to [facility] the labs with all requested information that was not on original order to the [place] she had requested & gave [place] fax # to. I am unsure what else to do for patient at this time? She lives in two different states making it very hard to come back for f/u labs.

## 2021-04-01 NOTE — Telephone Encounter (Signed)
Can we mail her physical lab reqs?

## 2021-04-02 NOTE — Telephone Encounter (Signed)
I have reprinted and filled in [facility] account #. I have placed in your folder to fax. I will also mail to patient.

## 2021-05-01 ENCOUNTER — Other Ambulatory Visit: Payer: Self-pay | Admitting: Family

## 2021-05-01 DIAGNOSIS — E785 Hyperlipidemia, unspecified: Secondary | ICD-10-CM

## 2021-06-11 ENCOUNTER — Other Ambulatory Visit: Payer: Medicare Other

## 2021-06-12 ENCOUNTER — Telehealth: Payer: Medicare Other | Admitting: Oncology

## 2021-09-17 ENCOUNTER — Inpatient Hospital Stay: Payer: Medicare Other | Attending: Oncology

## 2021-09-17 ENCOUNTER — Encounter: Payer: Self-pay | Admitting: Ophthalmology

## 2021-09-17 ENCOUNTER — Other Ambulatory Visit: Payer: Medicare Other

## 2021-09-17 DIAGNOSIS — Z8041 Family history of malignant neoplasm of ovary: Secondary | ICD-10-CM | POA: Insufficient documentation

## 2021-09-17 DIAGNOSIS — Z85828 Personal history of other malignant neoplasm of skin: Secondary | ICD-10-CM | POA: Diagnosis not present

## 2021-09-17 DIAGNOSIS — M810 Age-related osteoporosis without current pathological fracture: Secondary | ICD-10-CM | POA: Insufficient documentation

## 2021-09-17 DIAGNOSIS — Z79899 Other long term (current) drug therapy: Secondary | ICD-10-CM | POA: Insufficient documentation

## 2021-09-17 DIAGNOSIS — Z800 Family history of malignant neoplasm of digestive organs: Secondary | ICD-10-CM | POA: Insufficient documentation

## 2021-09-17 DIAGNOSIS — D751 Secondary polycythemia: Secondary | ICD-10-CM | POA: Insufficient documentation

## 2021-09-17 DIAGNOSIS — F17210 Nicotine dependence, cigarettes, uncomplicated: Secondary | ICD-10-CM | POA: Insufficient documentation

## 2021-09-17 DIAGNOSIS — Z86010 Personal history of colonic polyps: Secondary | ICD-10-CM | POA: Insufficient documentation

## 2021-09-17 DIAGNOSIS — J449 Chronic obstructive pulmonary disease, unspecified: Secondary | ICD-10-CM | POA: Diagnosis not present

## 2021-09-17 DIAGNOSIS — R5383 Other fatigue: Secondary | ICD-10-CM | POA: Insufficient documentation

## 2021-09-17 DIAGNOSIS — Z7982 Long term (current) use of aspirin: Secondary | ICD-10-CM | POA: Insufficient documentation

## 2021-09-17 DIAGNOSIS — E785 Hyperlipidemia, unspecified: Secondary | ICD-10-CM | POA: Insufficient documentation

## 2021-09-17 LAB — CBC WITH DIFFERENTIAL/PLATELET
Abs Immature Granulocytes: 0.03 10*3/uL (ref 0.00–0.07)
Basophils Absolute: 0 10*3/uL (ref 0.0–0.1)
Basophils Relative: 0 %
Eosinophils Absolute: 0.1 10*3/uL (ref 0.0–0.5)
Eosinophils Relative: 1 %
HCT: 48.1 % — ABNORMAL HIGH (ref 36.0–46.0)
Hemoglobin: 15.9 g/dL — ABNORMAL HIGH (ref 12.0–15.0)
Immature Granulocytes: 0 %
Lymphocytes Relative: 26 %
Lymphs Abs: 2.5 10*3/uL (ref 0.7–4.0)
MCH: 30.5 pg (ref 26.0–34.0)
MCHC: 33.1 g/dL (ref 30.0–36.0)
MCV: 92.3 fL (ref 80.0–100.0)
Monocytes Absolute: 0.7 10*3/uL (ref 0.1–1.0)
Monocytes Relative: 7 %
Neutro Abs: 6.2 10*3/uL (ref 1.7–7.7)
Neutrophils Relative %: 66 %
Platelets: 266 10*3/uL (ref 150–400)
RBC: 5.21 MIL/uL — ABNORMAL HIGH (ref 3.87–5.11)
RDW: 13.2 % (ref 11.5–15.5)
WBC: 9.5 10*3/uL (ref 4.0–10.5)
nRBC: 0 % (ref 0.0–0.2)

## 2021-09-18 ENCOUNTER — Telehealth: Payer: Medicare Other | Admitting: Oncology

## 2021-09-18 ENCOUNTER — Encounter: Payer: Self-pay | Admitting: Oncology

## 2021-09-18 ENCOUNTER — Inpatient Hospital Stay (HOSPITAL_BASED_OUTPATIENT_CLINIC_OR_DEPARTMENT_OTHER): Payer: Medicare Other | Admitting: Oncology

## 2021-09-18 DIAGNOSIS — D751 Secondary polycythemia: Secondary | ICD-10-CM

## 2021-09-18 NOTE — Progress Notes (Signed)
I connected with Abigail Willis on 09/18/21 at  1:00 PM EDT by video enabled telemedicine visit and verified that I am speaking with the correct person using two identifiers. ?  ?I discussed the limitations, risks, security and privacy concerns of performing an evaluation and management service by telemedicine and the availability of in-person appointments. I also discussed with the patient that there may be a patient responsible charge related to this service. The patient expressed understanding and agreed to proceed. ? ?Other persons participating in the visit and their role in the encounter:  none ? ?Patient's location:  home ?Provider's location:  work ? 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 ?History of present illness: patient is a 76 year old female with a past medical history significant for CAD hyperlipidemia and smoking referred for polycythemia.  Her most recent CBC from4/27/2022 which showed H&H of 16.1/48.2 with a normal white count and platelet count.  Of note her hemoglobin has been between 15-16 at least for the last 3 years.  No significant upward trend during this time.  Patient reports ongoing fatigue.  Denies any unintentional weight loss denies any blood in her urine ?  ?  ?Results of blood work from 09/25/2020 were as follows: CBC showed white cell count of 8.6, H&H of 16.1/48.9 with a platelet count of 264.  JAK2 mutation testing was negative.  EPO levels normal at 6.1.  Urinalysis did not show any hematuria. ?  ? ?Interval history patient is doing well overall and denies any specific complaints at this time. ? ? ?Review of Systems  ?Constitutional:  Negative for chills, fever, malaise/fatigue and weight loss.  ?HENT:  Negative for congestion, ear discharge and nosebleeds.   ?Eyes:  Negative for blurred vision.  ?Respiratory:  Negative for cough, hemoptysis, sputum production, shortness of breath and wheezing.   ?Cardiovascular:  Negative for chest pain,  palpitations, orthopnea and claudication.  ?Gastrointestinal:  Negative for abdominal pain, blood in stool, constipation, diarrhea, heartburn, melena, nausea and vomiting.  ?Genitourinary:  Negative for dysuria, flank pain, frequency, hematuria and urgency.  ?Musculoskeletal:  Negative for back pain, joint pain and myalgias.  ?Skin:  Negative for rash.  ?Neurological:  Negative for dizziness, tingling, focal weakness, seizures, weakness and headaches.  ?Endo/Heme/Allergies:  Does not bruise/bleed easily.  ?Psychiatric/Behavioral:  Negative for depression and suicidal ideas. The patient does not have insomnia.   ? ?Allergies  ?Allergen Reactions  ? Avelox [Moxifloxacin Hcl In Nacl] Other (See Comments)  ?  Severe stomach cramping and nausea  ? Penicillins Rash  ? ? ?Past Medical History:  ?Diagnosis Date  ? Allergy   ? SINUS  ? Cancer [facility])   ? skin  ? Cataract   ? bilateral  ? Colon polyps 11/14/2013  ? Tubular adenoma, hyperplastic polyps  ? COPD (chronic obstructive pulmonary disease) ([place])   ? Diverticulosis 11/15/2010  ? Moderate  ? External hemorrhoids   ? Hyperlipidemia   ? IBS (irritable bowel syndrome)   ? Osteoporosis   ? Squamous cell carcinoma in situ   ? Vertigo   ? ? ?Past Surgical History:  ?Procedure Laterality Date  ? BREAST BIOPSY    ? COLONOSCOPY    ? GANGLION CYST EXCISION    ? HAMMER TOE SURGERY    ? HEMORRHOID BANDING    ? LEEP    ?  twice, in [place]  ? MOHS SURGERY  2019  ? TONSILLECTOMY    ? TUBAL LIGATION    ? ? ?Social History  ? ?  Socioeconomic History  ? Marital status: Married  ?  Spouse name: Not on file  ? Number of children: 1  ? Years of education: Not on file  ? Highest education level: Not on file  ?Occupational History  ? Occupation: Retired  ?Tobacco Use  ? Smoking status: Every Day  ?  Packs/day: 1.00  ?  Years: 52.00  ?  Pack years: 52.00  ?  Types: Cigarettes  ? Smokeless tobacco: Never  ?Vaping Use  ? Vaping Use: Never used  ?Substance and Sexual Activity  ? Alcohol use: Yes  ?   Alcohol/week: 7.0 standard drinks  ?  Types: 7 Glasses of wine per week  ?  Comment: SOCIAL  ? Drug use: No  ? Sexual activity: Yes  ?Other Topics Concern  ? Not on file  ?Social History Narrative  ? Moved from [place] to be closer to her daughter, spends winters at [facility] home.  ?   ? Has HPOA- husband Cletus Gash.  ? Would desire feeding tube, no prolonged life support.  ? Not sure about feeding tubes.  ?   ?   ?   ? ?Social Determinants of Health  ? ?Financial Resource Strain: Low Risk   ? Difficulty of Paying Living Expenses: Not hard at all  ?Food Insecurity: No Food Insecurity  ? Worried About Charity fundraiser in the Last Year: Never true  ? Ran Out of Food in the Last Year: Never true  ?Transportation Needs: No Transportation Needs  ? Lack of Transportation (Medical): No  ? Lack of Transportation (Non-Medical): No  ?Physical Activity: Not on file  ?Stress: No Stress Concern Present  ? Feeling of Stress : Not at all  ?Social Connections: Unknown  ? Frequency of Communication with Friends and Family: More than three times a week  ? Frequency of Social Gatherings with Friends and Family: More than three times a week  ? Attends Religious Services: Not on file  ? Active Member of Clubs or Organizations: Yes  ? Attends Archivist Meetings: More than 4 times per year  ? Marital Status: Married  ?Intimate Partner Violence: Not At Risk  ? Fear of Current or Ex-Partner: No  ? Emotionally Abused: No  ? Physically Abused: No  ? Sexually Abused: No  ? ? ?Family History  ?Problem Relation Age of Onset  ? Heart attack Mother 55  ? Ovarian cancer Mother   ? Asthma Father   ? Brain cancer Sister   ? Colon cancer Cousin   ? Breast cancer Neg Hx   ? Adrenal disorder Neg Hx   ? Esophageal cancer Neg Hx   ? Rectal cancer Neg Hx   ? Stomach cancer Neg Hx   ? ? ? ?Current Outpatient Medications:  ?  acetaminophen (TYLENOL) 500 MG tablet, Take 500 mg by mouth as needed., Disp: , Rfl:  ?  aspirin 81 MG tablet,  Take 81 mg by mouth daily., Disp: , Rfl:  ?  cetirizine (ZYRTEC) 10 MG tablet, Take 10 mg by mouth daily., Disp: , Rfl:  ?  cyanocobalamin (,VITAMIN B-12,) 1000 MCG/ML injection, Inject 28m into intramuscularly one time per month., Disp: 10 mL, Rfl: 0 ?  ibuprofen (ADVIL,MOTRIN) 200 MG tablet, Take 200 mg by mouth as needed., Disp: , Rfl:  ?  loperamide (IMODIUM A-D) 2 MG tablet, Take 1 tablet (2 mg total) by mouth in the morning., Disp: 30 tablet, Rfl: 0 ?  pravastatin (PRAVACHOL) 20 MG tablet,  Take 1 tablet (20 mg total) by mouth at bedtime., Disp: 90 tablet, Rfl: 3 ?  pseudoephedrine-guaifenesin (MUCINEX D) 60-600 MG per tablet, Take 1 tablet by mouth every 12 (twelve) hours as needed., Disp: , Rfl:  ?  sodium chloride (OCEAN) 0.65 % SOLN nasal spray, Place 1 spray into both nostrils as needed for congestion., Disp: , Rfl:  ?  SYRINGE-NEEDLE, DISP, 3 ML (BD ECLIPSE SYRINGE) 25G X 1" 3 ML MISC, Used to give monthly B-12 injections., Disp: 50 each, Rfl: 0 ?  hyoscyamine (LEVBID) 0.375 MG 12 hr tablet, Take 1 tablet (0.375 mg total) by mouth every 12 (twelve) hours as needed. (Patient not taking: Reported on 09/18/2021), Disp: 60 tablet, Rfl: 6 ?  metoprolol tartrate (LOPRESSOR) 100 MG tablet, Take 1 tablet (100 mg) 2 hours prior to Cardiac CTA (Patient not taking: Reported on 09/18/2021), Disp: 1 tablet, Rfl: 0 ?  NEEDLE, DISP, 25 G (BD DISP NEEDLES) 25G X 5/8" MISC, Used to draw monthly B-12 injections., Disp: 50 each, Rfl: 0 ? ?No results found. ? ?No images are attached to the encounter. ? ? ? ?  Latest Ref Rng & Units 11/29/2019  ?  9:45 AM  ?CMP  ?Glucose 70 - 99 mg/dL 101    ?BUN 6 - 23 mg/dL 9    ?Creatinine 0.40 - 1.20 mg/dL 0.57    ?Sodium 135 - 145 mEq/L 139    ?Potassium 3.5 - 5.1 mEq/L 4.2    ?Chloride 96 - 112 mEq/L 104    ?CO2 19 - 32 mEq/L 28    ?Calcium 8.4 - 10.5 mg/dL 9.3    ?Total Protein 6.0 - 8.3 g/dL 6.9    ?Total Bilirubin 0.2 - 1.2 mg/dL 0.4    ?Alkaline Phos 39 - 117 U/L 70    ?AST 0 - 37 U/L 16     ?ALT 0 - 35 U/L 11    ? ? ?  Latest Ref Rng & Units 09/17/2021  ? 12:59 PM  ?CBC  ?WBC 4.0 - 10.5 K/uL 9.5    ?Hemoglobin 12.0 - 15.0 g/dL 15.9    ?Hematocrit 36.0 - 46.0 % 48.1    ?Platelets 150 - 400 K/uL 266

## 2021-09-19 NOTE — Discharge Instructions (Signed)

## 2021-09-24 ENCOUNTER — Encounter: Payer: Self-pay | Admitting: Ophthalmology

## 2021-09-24 ENCOUNTER — Ambulatory Visit
Admission: RE | Admit: 2021-09-24 | Discharge: 2021-09-24 | Disposition: A | Discharge status: Home / Self Care | Payer: Medicare Other | Attending: Ophthalmology | Admitting: Ophthalmology

## 2021-09-24 ENCOUNTER — Ambulatory Visit: Payer: Medicare Other | Admitting: Anesthesiology

## 2021-09-24 ENCOUNTER — Encounter
Admission: RE | Disposition: A | Discharge status: Home / Self Care | Payer: Self-pay | Source: Home / Self Care | Attending: Ophthalmology

## 2021-09-24 ENCOUNTER — Other Ambulatory Visit: Payer: Self-pay

## 2021-09-24 DIAGNOSIS — H2511 Age-related nuclear cataract, right eye: Secondary | ICD-10-CM | POA: Insufficient documentation

## 2021-09-24 DIAGNOSIS — F17210 Nicotine dependence, cigarettes, uncomplicated: Secondary | ICD-10-CM | POA: Insufficient documentation

## 2021-09-24 DIAGNOSIS — I2510 Atherosclerotic heart disease of native coronary artery without angina pectoris: Secondary | ICD-10-CM | POA: Insufficient documentation

## 2021-09-24 HISTORY — PX: CATARACT EXTRACTION W/PHACO: SHX586

## 2021-09-24 SURGERY — PHACOEMULSIFICATION, CATARACT, WITH IOL INSERTION
Anesthesia: Monitor Anesthesia Care | Laterality: Right

## 2021-09-24 MED ORDER — PHENYLEPHRINE HCL 10 % OP SOLN
1.0000 [drp] | OPHTHALMIC | Status: DC | PRN
  Administered 2021-09-24 (×3): 1 [drp] via OPHTHALMIC

## 2021-09-24 MED ORDER — CYCLOPENTOLATE HCL 2 % OP SOLN
1.0000 [drp] | OPHTHALMIC | Status: DC | PRN
  Administered 2021-09-24 (×3): 1 [drp] via OPHTHALMIC

## 2021-09-24 MED ORDER — BRIMONIDINE TARTRATE-TIMOLOL 0.2-0.5 % OP SOLN
OPHTHALMIC | Status: DC | PRN
  Administered 2021-09-24: 1 [drp] via OPHTHALMIC

## 2021-09-24 MED ORDER — SIGHTPATH DOSE#1 NA CHONDROIT SULF-NA HYALURON 40-17 MG/ML IO SOLN
INTRAOCULAR | Status: DC | PRN
Start: 2021-09-24 — End: 2021-09-24
  Administered 2021-09-24: 1 mL via INTRAOCULAR

## 2021-09-24 MED ORDER — CEFUROXIME OPHTHALMIC INJECTION 1 MG/0.1 ML
INJECTION | OPHTHALMIC | Status: DC | PRN
  Administered 2021-09-24: 1 mg via OPHTHALMIC

## 2021-09-24 MED ORDER — SIGHTPATH DOSE#1 BSS IO SOLN
INTRAOCULAR | Status: DC | PRN
  Administered 2021-09-24: 15 mL

## 2021-09-24 MED ORDER — SIGHTPATH DOSE#1 BSS IO SOLN
INTRAOCULAR | Status: DC | PRN
  Administered 2021-09-24: 1 mL via INTRAMUSCULAR

## 2021-09-24 MED ORDER — MIDAZOLAM HCL 2 MG/2ML IJ SOLN
INTRAMUSCULAR | Status: DC | PRN
  Administered 2021-09-24: 2 mg via INTRAVENOUS
  Administered 2021-09-24: 1 mg via INTRAVENOUS

## 2021-09-24 MED ORDER — LACTATED RINGERS IV SOLN: INTRAVENOUS | Status: DC

## 2021-09-24 MED ORDER — FENTANYL CITRATE (PF) 100 MCG/2ML IJ SOLN
INTRAMUSCULAR | Status: DC | PRN
  Administered 2021-09-24: 100 ug via INTRAVENOUS

## 2021-09-24 MED ORDER — EPINEPHRINE PF 1 MG/ML IJ SOLN
INTRAMUSCULAR | Status: DC | PRN
  Administered 2021-09-24: 54 mL via OPHTHALMIC

## 2021-09-24 MED ORDER — TETRACAINE HCL 0.5 % OP SOLN
1.0000 [drp] | OPHTHALMIC | Status: DC | PRN
  Administered 2021-09-24 (×3): 1 [drp] via OPHTHALMIC

## 2021-09-24 SURGICAL SUPPLY — 10 items
CATARACT SUITE SIGHTPATH (MISCELLANEOUS) ×2 IMPLANT
FEE CATARACT SUITE SIGHTPATH (MISCELLANEOUS) ×1 IMPLANT
GLOVE SURG ENC TEXT LTX SZ8 (GLOVE) ×2 IMPLANT
GLOVE SURG TRIUMPH 8.0 PF LTX (GLOVE) ×2 IMPLANT
LENS IOL TECNIS EYHANCE 21.0 (Intraocular Lens) ×1 IMPLANT
NDL FILTER BLUNT 18X1 1/2 (NEEDLE) ×1 IMPLANT
NEEDLE FILTER BLUNT 18X 1/2SAF (NEEDLE) ×1
NEEDLE FILTER BLUNT 18X1 1/2 (NEEDLE) ×1 IMPLANT
SYR 3ML LL SCALE MARK (SYRINGE) ×2 IMPLANT
WATER STERILE IRR 250ML POUR (IV SOLUTION) ×2 IMPLANT

## 2021-09-24 NOTE — H&P (Signed)
[place]  ? ?Primary Care Physician:  Burnard Hawthorne, FNP ?Ophthalmologist: Dr. George Ina ? ?Pre-Procedure History & Physical: ?HPI:  Abigail Willis is a 76 y.o. female here for cataract surgery. ?  ?Past Medical History:  ?Diagnosis Date  ? Allergy   ? SINUS  ? Cancer [facility])   ? skin  ? Cataract   ? bilateral  ? Colon polyps 11/14/2013  ? Tubular adenoma, hyperplastic polyps  ? COPD (chronic obstructive pulmonary disease) ([place])   ? Diverticulosis 11/15/2010  ? Moderate  ? External hemorrhoids   ? Hyperlipidemia   ? IBS (irritable bowel syndrome)   ? Osteoporosis   ? Squamous cell carcinoma in situ   ? Vertigo   ? ? ?Past Surgical History:  ?Procedure Laterality Date  ? BREAST BIOPSY    ? COLONOSCOPY    ? GANGLION CYST EXCISION    ? HAMMER TOE SURGERY    ? HEMORRHOID BANDING    ? LEEP    ?  twice, in [place]  ? MOHS SURGERY  2019  ? TONSILLECTOMY    ? TUBAL LIGATION    ? ? ?Prior to Admission medications   ?Medication Sig Start Date End Date Taking? Authorizing Provider  ?acetaminophen (TYLENOL) 500 MG tablet Take 500 mg by mouth as needed.   Yes [provider]  ?aspirin 81 MG tablet Take 81 mg by mouth daily.   Yes [provider]  ?cetirizine (ZYRTEC) 10 MG tablet Take 10 mg by mouth daily.   Yes [provider]  ?cyanocobalamin (,VITAMIN B-12,) 1000 MCG/ML injection Inject 58m into intramuscularly one time per month. 02/22/21  Yes ABurnard Hawthorne FNP  ?hyoscyamine (LEVBID) 0.375 MG 12 hr tablet Take 1 tablet (0.375 mg total) by mouth every 12 (twelve) hours as needed. ?Patient not taking: Reported on 09/18/2021 11/05/20  Yes JMilus Banister MD  ?ibuprofen (ADVIL,MOTRIN) 200 MG tablet Take 200 mg by mouth as needed.   Yes [provider]  ?loperamide (IMODIUM A-D) 2 MG tablet Take 1 tablet (2 mg total) by mouth in the morning. 10/10/20  Yes JMilus Banister MD  ?pravastatin (PRAVACHOL) 20 MG tablet Take 1 tablet (20 mg total) by mouth at bedtime. 01/31/21   Yes ABurnard Hawthorne FNP  ?metoprolol tartrate (LOPRESSOR) 100 MG tablet Take 1 tablet (100 mg) 2 hours prior to Cardiac CTA ?Patient not taking: Reported on 09/18/2021 02/12/21   VMarrianne MoodD, PA-C  ?NEEDLE, DISP, 25 G (BD DISP NEEDLES) 25G X 5/8" MISC Used to draw monthly B-12 injections. 02/22/21   ABurnard Hawthorne FNP  ?pseudoephedrine-guaifenesin (MUCINEX D) 60-600 MG per tablet Take 1 tablet by mouth every 12 (twelve) hours as needed.    [provider]  ?sodium chloride (OCEAN) 0.65 % SOLN nasal spray Place 1 spray into both nostrils as needed for congestion.    [provider]  ?SYRINGE-NEEDLE, DISP, 3 ML (BD ECLIPSE SYRINGE) 25G X 1" 3 ML MISC Used to give monthly B-12 injections. 02/22/21   ABurnard Hawthorne FNP  ? ? ?Allergies as of 09/09/2021 - Review Complete 02/12/2021  ?Allergen Reaction Noted  ? Avelox [moxifloxacin hcl in nacl] Other (See Comments) 03/16/2012  ? Penicillins Rash 03/06/2010  ? ? ?Family History  ?Problem Relation Age of Onset  ? Heart attack Mother 769 ? Ovarian cancer Mother   ? Asthma Father   ? Brain cancer Sister   ? Colon cancer Cousin   ? Breast cancer Neg  Hx   ? Adrenal disorder Neg Hx   ? Esophageal cancer Neg Hx   ? Rectal cancer Neg Hx   ? Stomach cancer Neg Hx   ? ? ?Social History  ? ?Socioeconomic History  ? Marital status: Married  ?  Spouse name: Not on file  ? Number of children: 1  ? Years of education: Not on file  ? Highest education level: Not on file  ?Occupational History  ? Occupation: Retired  ?Tobacco Use  ? Smoking status: Every Day  ?  Packs/day: 1.00  ?  Years: 52.00  ?  Pack years: 52.00  ?  Types: Cigarettes  ? Smokeless tobacco: Never  ?Vaping Use  ? Vaping Use: Never used  ?Substance and Sexual Activity  ? Alcohol use: Yes  ?  Alcohol/week: 7.0 standard drinks  ?  Types: 7 Glasses of wine per week  ?  Comment: SOCIAL  ? Drug use: No  ? Sexual activity: Yes  ?Other Topics Concern  ? Not on file  ?Social History Narrative  ?  Moved from [place] to be closer to her daughter, spends winters at [facility] home.  ?   ? Has HPOA- husband Cletus Gash.  ? Would desire feeding tube, no prolonged life support.  ? Not sure about feeding tubes.  ?   ?   ?   ? ?Social Determinants of Health  ? ?Financial Resource Strain: Low Risk   ? Difficulty of Paying Living Expenses: Not hard at all  ?Food Insecurity: No Food Insecurity  ? Worried About Charity fundraiser in the Last Year: Never true  ? Ran Out of Food in the Last Year: Never true  ?Transportation Needs: No Transportation Needs  ? Lack of Transportation (Medical): No  ? Lack of Transportation (Non-Medical): No  ?Physical Activity: Not on file  ?Stress: No Stress Concern Present  ? Feeling of Stress : Not at all  ?Social Connections: Unknown  ? Frequency of Communication with Friends and Family: More than three times a week  ? Frequency of Social Gatherings with Friends and Family: More than three times a week  ? Attends Religious Services: Not on file  ? Active Member of Clubs or Organizations: Yes  ? Attends Archivist Meetings: More than 4 times per year  ? Marital Status: Married  ?Intimate Partner Violence: Not At Risk  ? Fear of Current or Ex-Partner: No  ? Emotionally Abused: No  ? Physically Abused: No  ? Sexually Abused: No  ? ? ?Review of Systems: ?See HPI, otherwise negative ROS ? ?Physical Exam: ?BP 128/65   Pulse 77   Temp (!) 97.5 ?F (36.4 ?C) (Temporal)   Resp 18   Ht '5\' 1"'$  (1.549 m)   Wt 68.7 kg   SpO2 94%   BMI 28.61 kg/m?  ?General:   Alert, cooperative in NAD ?Head:  Normocephalic and atraumatic. ?Respiratory:  Normal work of breathing. ?Cardiovascular:  RRR ? ?Impression/Plan: ?Abigail Willis is here for cataract surgery. ? ?Risks, benefits, limitations, and alternatives regarding cataract surgery have been reviewed with the patient.  Questions have been answered.  All parties agreeable. ? ? ?Birder Robson, MD  09/24/2021, 8:16 AM ? ? ?

## 2021-09-24 NOTE — Anesthesia Preprocedure Evaluation (Signed)
Anesthesia Evaluation  Patient identified by MRN, date of birth, ID band Patient awake    Reviewed: Allergy & Precautions, H&P , NPO status , Patient's Chart, lab work & pertinent test results  Airway Mallampati: II  TM Distance: >3 FB Neck ROM: full    Dental no notable dental hx.    Pulmonary COPD, Current Smoker and Patient abstained from smoking.,    Pulmonary exam normal        Cardiovascular + CAD  Normal cardiovascular exam Rhythm:regular Rate:Normal     Neuro/Psych    GI/Hepatic negative GI ROS, Neg liver ROS,   Endo/Other  negative endocrine ROS  Renal/GU negative Renal ROS     Musculoskeletal   Abdominal   Peds  Hematology negative hematology ROS (+)   Anesthesia Other Findings   Reproductive/Obstetrics                             Anesthesia Physical  Anesthesia Plan  ASA: 2  Anesthesia Plan: MAC   Post-op Pain Management:    Induction:   PONV Risk Score and Plan: 1 and TIVA, Midazolam and Treatment may vary due to age or medical condition  Airway Management Planned:   Additional Equipment:   Intra-op Plan:   Post-operative Plan:   Informed Consent: I have reviewed the patients History and Physical, chart, labs and discussed the procedure including the risks, benefits and alternatives for the proposed anesthesia with the patient or authorized representative who has indicated his/her understanding and acceptance.       Plan Discussed with:   Anesthesia Plan Comments:         Anesthesia Quick Evaluation  

## 2021-09-24 NOTE — Anesthesia Postprocedure Evaluation (Signed)
Anesthesia Post Note ? ?Patient: Abigail Willis ? ?Procedure(s) Performed: CATARACT EXTRACTION PHACO AND INTRAOCULAR LENS PLACEMENT (IOC) RIGHT (Right) ? ? ?  ?Patient location during evaluation: PACU ?Anesthesia Type: MAC ?Level of consciousness: awake and alert ?Pain management: pain level controlled ?Vital Signs Assessment: post-procedure vital signs reviewed and stable ?Respiratory status: spontaneous breathing ?Cardiovascular status: stable ?Anesthetic complications: no ? ? ?No notable events documented. ? ?Virl Axe,  Shenicka Sunderlin D ? ? ? ? ? ?

## 2021-09-24 NOTE — Op Note (Signed)
PREOPERATIVE DIAGNOSIS:  Nuclear sclerotic cataract of the right eye. ?  ?POSTOPERATIVE DIAGNOSIS:  H25.11 Cataract ?  ?OPERATIVE PROCEDURE:ORPROCALL@ ?  ?SURGEON:  Birder Robson, MD. ?  ?ANESTHESIA: ? ?Anesthesiologist: Elgie Collard, MD ?CRNA: Silvana Newness, CRNA ? ?1.      Managed anesthesia care. ?2.      0.20m of Shugarcaine was instilled in the eye following the paracentesis. ?  ?COMPLICATIONS:  None. ?  ?TECHNIQUE:   Stop and chop ?  ?DESCRIPTION OF PROCEDURE:  The patient was examined and consented in the preoperative holding area where the aforementioned topical anesthesia was applied to the right eye and then brought back to the Operating Room where the right eye was prepped and draped in the usual sterile ophthalmic fashion and a lid speculum was placed. A paracentesis was created with the side port blade and the anterior chamber was filled with viscoelastic. A near clear corneal incision was performed with the steel keratome. A continuous curvilinear capsulorrhexis was performed with a cystotome followed by the capsulorrhexis forceps. Hydrodissection and hydrodelineation were carried out with BSS on a blunt cannula. The lens was removed in a stop and chop  technique and the remaining cortical material was removed with the irrigation-aspiration handpiece. The capsular bag was inflated with viscoelastic and the Technis ZCB00  lens was placed in the capsular bag without complication. The remaining viscoelastic was removed from the eye with the irrigation-aspiration handpiece. The wounds were hydrated. The anterior chamber was flushed with BSS and the eye was inflated to physiologic pressure. 0.128mof Vigamox was placed in the anterior chamber. The wounds were found to be water tight. The eye was dressed with Combigan. The patient was given protective glasses to wear throughout the day and a shield with which to sleep tonight. The patient was also given drops with which to begin a drop regimen today and  will follow-up with me in one day. ?Implant Name Type Inv. Item Serial No. Manufacturer Lot No. LRB No. Used Action  ?LENS IOL TECNIS EYHANCE 21.0 - [id number] Lens LENS IOL TECNIS EYHANCE 21.0 [id number]  Right 1 Implanted  ? ?Procedure(s) with comments: ?CATARACT EXTRACTION PHACO AND INTRAOCULAR LENS PLACEMENT (IOC) RIGHT (Right) - 15.97 ?01:15.5 ? ?Electronically signed: WiBirder Robson/01/2022 8:41 AM ? ?

## 2021-09-24 NOTE — Transfer of Care (Signed)
Immediate Anesthesia Transfer of Care Note ? ?Patient: Abigail Willis ? ?Procedure(s) Performed: CATARACT EXTRACTION PHACO AND INTRAOCULAR LENS PLACEMENT (IOC) RIGHT (Right) ? ?Patient Location: PACU ? ?Anesthesia Type: MAC ? ?Level of Consciousness: awake, alert  and patient cooperative ? ?Airway and Oxygen Therapy: Patient Spontanous Breathing and Patient connected to supplemental oxygen ? ?Post-op Assessment: Post-op Vital signs reviewed, Patient's Cardiovascular Status Stable, Respiratory Function Stable, Patent Airway and No signs of Nausea or vomiting ? ?Post-op Vital Signs: Reviewed and stable ? ?Complications: No notable events documented. ? ?

## 2021-09-25 ENCOUNTER — Encounter: Payer: Self-pay | Admitting: Ophthalmology

## 2021-09-25 ENCOUNTER — Telehealth (HOSPITAL_COMMUNITY): Payer: Self-pay | Admitting: Emergency Medicine

## 2021-09-25 NOTE — Telephone Encounter (Signed)
Reaching out to patient to offer assistance regarding upcoming cardiac imaging study; pt verbalizes understanding of appt date/time, parking situation and where to check in, pre-test NPO status and medications ordered, and verified current allergies; name and call back number provided for further questions should they arise ?Marchia Bond RN Navigator Cardiac Imaging ?[facility] ?[phone] office ?[phone] cell ? ?Denies iv issues ?'100mg'$  metoprolol tart ?Arrival 845 ?

## 2021-09-26 ENCOUNTER — Ambulatory Visit: Admission: RE | Admit: 2021-09-26 | Payer: Medicare Other | Source: Ambulatory Visit

## 2021-10-04 ENCOUNTER — Telehealth (HOSPITAL_COMMUNITY): Payer: Self-pay | Admitting: *Deleted

## 2021-10-04 NOTE — Telephone Encounter (Signed)
Reaching out to patient in regards to her CCTA appointment. She state she still has her instructions from our previous call and has no questions other than her insurance approval. Informed her that I will check on that for her.  Gordy Clement RN Navigator Cardiac Imaging [facility] [phone] Office [phone] Cell

## 2021-10-07 ENCOUNTER — Ambulatory Visit
Admission: RE | Admit: 2021-10-07 | Discharge: 2021-10-07 | Disposition: A | Discharge status: Home / Self Care | Payer: Medicare Other | Source: Ambulatory Visit | Attending: Physician Assistant | Admitting: Physician Assistant

## 2021-10-07 ENCOUNTER — Other Ambulatory Visit: Payer: Self-pay

## 2021-10-07 DIAGNOSIS — I700 Atherosclerosis of aorta: Secondary | ICD-10-CM | POA: Insufficient documentation

## 2021-10-07 DIAGNOSIS — R0600 Dyspnea, unspecified: Secondary | ICD-10-CM | POA: Insufficient documentation

## 2021-10-07 LAB — POCT I-STAT CREATININE: Creatinine, Ser: 0.5 mg/dL (ref 0.44–1.00)

## 2021-10-07 MED ORDER — IOHEXOL 350 MG/ML SOLN
75.0000 mL | Freq: Once | INTRAVENOUS | Status: AC | PRN
  Administered 2021-10-07: 75 mL via INTRAVENOUS

## 2021-10-07 MED ORDER — NITROGLYCERIN 0.4 MG SL SUBL
0.8000 mg | SUBLINGUAL_TABLET | Freq: Once | SUBLINGUAL | Status: AC
  Administered 2021-10-07: 0.8 mg via SUBLINGUAL

## 2021-10-07 NOTE — Discharge Instructions (Signed)

## 2021-10-07 NOTE — Progress Notes (Signed)
Patient tolerated procedure well. Ambulate w/o difficulty. Denies light headedness or being dizzy. Sitting in chair drinking water provided. Encouraged to drink extra water today and reasoning explained. Verbalized understanding. All questions answered. ABC intact. No further needs. Discharge from procedure area w/o issues.   °

## 2021-10-08 ENCOUNTER — Ambulatory Visit
Admission: RE | Admit: 2021-10-08 | Discharge: 2021-10-08 | Disposition: A | Discharge status: Home / Self Care | Payer: Medicare Other | Attending: Ophthalmology | Admitting: Ophthalmology

## 2021-10-08 ENCOUNTER — Ambulatory Visit: Payer: Medicare Other | Admitting: Anesthesiology

## 2021-10-08 ENCOUNTER — Other Ambulatory Visit: Payer: Self-pay

## 2021-10-08 ENCOUNTER — Encounter: Payer: Self-pay | Admitting: Ophthalmology

## 2021-10-08 ENCOUNTER — Encounter
Admission: RE | Disposition: A | Discharge status: Home / Self Care | Payer: Self-pay | Source: Home / Self Care | Attending: Ophthalmology

## 2021-10-08 DIAGNOSIS — I2510 Atherosclerotic heart disease of native coronary artery without angina pectoris: Secondary | ICD-10-CM | POA: Diagnosis not present

## 2021-10-08 DIAGNOSIS — H2512 Age-related nuclear cataract, left eye: Secondary | ICD-10-CM | POA: Insufficient documentation

## 2021-10-08 DIAGNOSIS — F17210 Nicotine dependence, cigarettes, uncomplicated: Secondary | ICD-10-CM | POA: Diagnosis not present

## 2021-10-08 DIAGNOSIS — J449 Chronic obstructive pulmonary disease, unspecified: Secondary | ICD-10-CM | POA: Diagnosis not present

## 2021-10-08 HISTORY — PX: CATARACT EXTRACTION W/PHACO: SHX586

## 2021-10-08 SURGERY — PHACOEMULSIFICATION, CATARACT, WITH IOL INSERTION
Anesthesia: Monitor Anesthesia Care | Laterality: Left

## 2021-10-08 MED ORDER — MOXIFLOXACIN HCL 0.5 % OP SOLN
OPHTHALMIC | Status: DC | PRN
  Administered 2021-10-08: 0.2 mL via OPHTHALMIC

## 2021-10-08 MED ORDER — BRIMONIDINE TARTRATE-TIMOLOL 0.2-0.5 % OP SOLN
OPHTHALMIC | Status: DC | PRN
  Administered 2021-10-08: 1 [drp] via OPHTHALMIC

## 2021-10-08 MED ORDER — ONDANSETRON HCL 4 MG/2ML IJ SOLN
INTRAMUSCULAR | Status: DC | PRN
  Administered 2021-10-08: 4 mg via INTRAVENOUS

## 2021-10-08 MED ORDER — ACETAMINOPHEN 160 MG/5ML PO SOLN: 975.0000 mg | Freq: Once | ORAL | Status: DC | PRN

## 2021-10-08 MED ORDER — EPINEPHRINE PF 1 MG/ML IJ SOLN
INTRAMUSCULAR | Status: DC | PRN
  Administered 2021-10-08: 60 mL via OPHTHALMIC

## 2021-10-08 MED ORDER — ONDANSETRON HCL 4 MG/2ML IJ SOLN: 4.0000 mg | Freq: Once | INTRAMUSCULAR | Status: DC | PRN

## 2021-10-08 MED ORDER — FENTANYL CITRATE (PF) 100 MCG/2ML IJ SOLN
INTRAMUSCULAR | Status: DC | PRN
  Administered 2021-10-08 (×2): 50 ug via INTRAVENOUS

## 2021-10-08 MED ORDER — SIGHTPATH DOSE#1 BSS IO SOLN
INTRAOCULAR | Status: DC | PRN
  Administered 2021-10-08: 2 mL

## 2021-10-08 MED ORDER — SIGHTPATH DOSE#1 NA CHONDROIT SULF-NA HYALURON 40-17 MG/ML IO SOLN
INTRAOCULAR | Status: DC | PRN
  Administered 2021-10-08: 1 mL via INTRAOCULAR

## 2021-10-08 MED ORDER — TETRACAINE HCL 0.5 % OP SOLN
1.0000 [drp] | OPHTHALMIC | Status: DC | PRN
  Administered 2021-10-08 (×3): 1 [drp] via OPHTHALMIC

## 2021-10-08 MED ORDER — SIGHTPATH DOSE#1 BSS IO SOLN
INTRAOCULAR | Status: DC | PRN
  Administered 2021-10-08: 15 mL via INTRAOCULAR

## 2021-10-08 MED ORDER — MIDAZOLAM HCL 2 MG/2ML IJ SOLN
INTRAMUSCULAR | Status: DC | PRN
  Administered 2021-10-08 (×2): 2 mg via INTRAVENOUS

## 2021-10-08 MED ORDER — ARMC OPHTHALMIC DILATING DROPS
1.0000 | OPHTHALMIC | Status: DC | PRN
Start: 2021-10-08 — End: 2021-10-08
  Administered 2021-10-08 (×3): 1 via OPHTHALMIC

## 2021-10-08 MED ORDER — DEXMEDETOMIDINE (PRECEDEX) IN NS 20 MCG/5ML (4 MCG/ML) IV SYRINGE
PREFILLED_SYRINGE | INTRAVENOUS | Status: DC | PRN
  Administered 2021-10-08: 10 ug via INTRAVENOUS

## 2021-10-08 MED ORDER — LACTATED RINGERS IV SOLN: INTRAVENOUS | Status: DC

## 2021-10-08 MED ORDER — ACETAMINOPHEN 500 MG PO TABS: 1000.0000 mg | ORAL_TABLET | Freq: Once | ORAL | Status: DC | PRN

## 2021-10-08 SURGICAL SUPPLY — 12 items
CANNULA ANT/CHMB 27G (MISCELLANEOUS) IMPLANT
CANNULA ANT/CHMB 27GA (MISCELLANEOUS) IMPLANT
CATARACT SUITE SIGHTPATH (MISCELLANEOUS) ×2 IMPLANT
FEE CATARACT SUITE SIGHTPATH (MISCELLANEOUS) ×1 IMPLANT
GLOVE SURG ENC TEXT LTX SZ8 (GLOVE) ×2 IMPLANT
GLOVE SURG TRIUMPH 8.0 PF LTX (GLOVE) ×2 IMPLANT
LENS IOL TECNIS EYHANCE 20.5 (Intraocular Lens) ×1 IMPLANT
NDL FILTER BLUNT 18X1 1/2 (NEEDLE) ×1 IMPLANT
NEEDLE FILTER BLUNT 18X 1/2SAF (NEEDLE) ×1
NEEDLE FILTER BLUNT 18X1 1/2 (NEEDLE) ×1 IMPLANT
SYR 3ML LL SCALE MARK (SYRINGE) ×2 IMPLANT
WATER STERILE IRR 250ML POUR (IV SOLUTION) ×2 IMPLANT

## 2021-10-08 NOTE — Anesthesia Procedure Notes (Signed)
Procedure Name: MAC Date/Time: 10/08/2021 9:55 AM Performed by: Jeannene Patella, CRNA Pre-anesthesia Checklist: Patient identified, Emergency Drugs available, Suction available, Timeout performed and Patient being monitored Patient Re-evaluated:Patient Re-evaluated prior to induction Oxygen Delivery Method: Nasal cannula Placement Confirmation: positive ETCO2

## 2021-10-08 NOTE — Anesthesia Postprocedure Evaluation (Signed)
Anesthesia Post Note  Patient: Abigail Willis  Procedure(s) Performed: CATARACT EXTRACTION PHACO AND INTRAOCULAR LENS PLACEMENT (IOC) LEFT (Left)     Patient location during evaluation: PACU Anesthesia Type: MAC Level of consciousness: awake and alert Pain management: pain level controlled Vital Signs Assessment: post-procedure vital signs reviewed and stable Respiratory status: spontaneous breathing, nonlabored ventilation and respiratory function stable Cardiovascular status: stable and blood pressure returned to baseline Postop Assessment: no apparent nausea or vomiting Anesthetic complications: no   No notable events documented.  April Manson

## 2021-10-08 NOTE — Anesthesia Preprocedure Evaluation (Signed)
Anesthesia Evaluation  Patient identified by MRN, date of birth, ID band Patient awake    Reviewed: Allergy & Precautions, H&P , NPO status , Patient's Chart, lab work & pertinent test results  Airway Mallampati: II  TM Distance: >3 FB Neck ROM: full    Dental no notable dental hx.    Pulmonary COPD, Current Smoker and Patient abstained from smoking.,    Pulmonary exam normal        Cardiovascular + CAD  Normal cardiovascular exam Rhythm:regular Rate:Normal     Neuro/Psych    GI/Hepatic negative GI ROS, Neg liver ROS,   Endo/Other  negative endocrine ROS  Renal/GU negative Renal ROS     Musculoskeletal   Abdominal   Peds  Hematology negative hematology ROS (+)   Anesthesia Other Findings   Reproductive/Obstetrics                             Anesthesia Physical  Anesthesia Plan  ASA: 2  Anesthesia Plan: MAC   Post-op Pain Management:    Induction:   PONV Risk Score and Plan: 1 and TIVA, Midazolam and Treatment may vary due to age or medical condition  Airway Management Planned:   Additional Equipment:   Intra-op Plan:   Post-operative Plan:   Informed Consent: I have reviewed the patients History and Physical, chart, labs and discussed the procedure including the risks, benefits and alternatives for the proposed anesthesia with the patient or authorized representative who has indicated his/her understanding and acceptance.       Plan Discussed with:   Anesthesia Plan Comments:         Anesthesia Quick Evaluation

## 2021-10-08 NOTE — Transfer of Care (Signed)
Immediate Anesthesia Transfer of Care Note  Patient: Abigail Willis  Procedure(s) Performed: CATARACT EXTRACTION PHACO AND INTRAOCULAR LENS PLACEMENT (IOC) LEFT (Left)  Patient Location: PACU  Anesthesia Type: MAC  Level of Consciousness: awake, alert  and patient cooperative  Airway and Oxygen Therapy: Patient Spontanous Breathing and Patient connected to supplemental oxygen  Post-op Assessment: Post-op Vital signs reviewed, Patient's Cardiovascular Status Stable, Respiratory Function Stable, Patent Airway and No signs of Nausea or vomiting  Post-op Vital Signs: Reviewed and stable  Complications: No notable events documented.

## 2021-10-08 NOTE — H&P (Signed)
[facility]   Primary Care Physician:  Burnard Hawthorne, FNP Ophthalmologist: Dr.POPrfilio  Pre-Procedure History & Physical: HPI:  Abigail Willis is a 76 y.o. female here for cataract surgery.   Past Medical History:  Diagnosis Date   Allergy    SINUS   Cancer ([place])    skin   Cataract    bilateral   Colon polyps 11/14/2013   Tubular adenoma, hyperplastic polyps   COPD (chronic obstructive pulmonary disease) (HCC)    Diverticulosis 11/15/2010   Moderate   External hemorrhoids    Hyperlipidemia    IBS (irritable bowel syndrome)    Osteoporosis    Squamous cell carcinoma in situ    Vertigo     Past Surgical History:  Procedure Laterality Date   BREAST BIOPSY     CATARACT EXTRACTION W/PHACO Right 09/24/2021   Procedure: CATARACT EXTRACTION PHACO AND INTRAOCULAR LENS PLACEMENT ([place]) RIGHT;  Surgeon: Birder Robson, MD;  Location: [place];  Service: Ophthalmology;  Laterality: Right;  15.97 01:15.5   COLONOSCOPY     GANGLION CYST EXCISION     HAMMER TOE SURGERY     HEMORRHOID BANDING     LEEP      twice, in [place]  2019   TONSILLECTOMY     TUBAL LIGATION      Prior to Admission medications   Medication Sig Start Date End Date Taking? Authorizing Provider  acetaminophen (TYLENOL) 500 MG tablet Take 500 mg by mouth as needed.   Yes [provider]  aspirin 81 MG tablet Take 81 mg by mouth daily.   Yes [provider]  cetirizine (ZYRTEC) 10 MG tablet Take 10 mg by mouth daily.   Yes [provider]  cyanocobalamin (,VITAMIN B-12,) 1000 MCG/ML injection Inject 37m into intramuscularly one time per month. 02/22/21  Yes ABurnard Hawthorne FNP  hyoscyamine (LEVBID) 0.375 MG 12 hr tablet Take 1 tablet (0.375 mg total) by mouth every 12 (twelve) hours as needed. 11/05/20  Yes JMilus Banister MD  ibuprofen (ADVIL,MOTRIN) 200 MG tablet Take 200 mg by mouth as needed.   Yes [provider]   loperamide (IMODIUM A-D) 2 MG tablet Take 1 tablet (2 mg total) by mouth in the morning. 10/10/20  Yes JMilus Banister MD  metoprolol tartrate (LOPRESSOR) 100 MG tablet Take 1 tablet (100 mg) 2 hours prior to Cardiac CTA 02/12/21  Yes Visser, Jacquelyn D, PA-C  NEEDLE, DISP, 25 G (BD DISP NEEDLES) 25G X 5/8" MISC Used to draw monthly B-12 injections. 02/22/21  Yes ABurnard Hawthorne FNP  pravastatin (PRAVACHOL) 20 MG tablet Take 1 tablet (20 mg total) by mouth at bedtime. 01/31/21  Yes Arnett, MYvetta Coder FNP  pseudoephedrine-guaifenesin (MUCINEX D) 60-600 MG per tablet Take 1 tablet by mouth every 12 (twelve) hours as needed.   Yes [provider]  sodium chloride (OCEAN) 0.65 % SOLN nasal spray Place 1 spray into both nostrils as needed for congestion.   Yes [provider]  SYRINGE-NEEDLE, DISP, 3 ML (BD ECLIPSE SYRINGE) 25G X 1" 3 ML MISC Used to give monthly B-12 injections. 02/22/21  Yes ABurnard Hawthorne FNP    Allergies as of 09/09/2021 - Review Complete 02/12/2021  Allergen Reaction Noted   Avelox [moxifloxacin hcl in nacl] Other (See Comments) 03/16/2012   Penicillins Rash 03/06/2010    Family History  Problem Relation Age of Onset   Heart attack Mother 758  Ovarian  cancer Mother    Asthma Father    Brain cancer Sister    Colon cancer Cousin    Breast cancer Neg Hx    Adrenal disorder Neg Hx    Esophageal cancer Neg Hx    Rectal cancer Neg Hx    Stomach cancer Neg Hx     Social History   Socioeconomic History   Marital status: Married    Spouse name: Not on file   Number of children: 1   Years of education: Not on file   Highest education level: Not on file  Occupational History   Occupation: Retired  Tobacco Use   Smoking status: Every Day    Packs/day: 1.00    Years: 52.00    Pack years: 52.00    Types: Cigarettes   Smokeless tobacco: Never  Vaping Use   Vaping Use: Never used  Substance and Sexual Activity   Alcohol use: Yes     Alcohol/week: 7.0 standard drinks    Types: 7 Glasses of wine per week    Comment: SOCIAL   Drug use: No   Sexual activity: Yes  Other Topics Concern   Not on file  Social History Narrative   Moved from [place] to be closer to her daughter, spends winters at [facility] home.      Has HPOA- husband Cletus Gash.   Would desire feeding tube, no prolonged life support.   Not sure about feeding tubes.            Social Determinants of Health   Financial Resource Strain: Low Risk    Difficulty of Paying Living Expenses: Not hard at all  Food Insecurity: No Food Insecurity   Worried About Charity fundraiser in the Last Year: Never true   [place] in the Last Year: Never true  Transportation Needs: No Transportation Needs   Lack of Transportation (Medical): No   Lack of Transportation (Non-Medical): No  Physical Activity: Not on file  Stress: No Stress Concern Present   Feeling of Stress : Not at all  Social Connections: Unknown   Frequency of Communication with Friends and Family: More than three times a week   Frequency of Social Gatherings with Friends and Family: More than three times a week   Attends Religious Services: Not on Electrical engineer or Organizations: Yes   Attends Music therapist: More than 4 times per year   Marital Status: Married  Human resources officer Violence: Not At Risk   Fear of Current or Ex-Partner: No   Emotionally Abused: No   Physically Abused: No   Sexually Abused: No    Review of Systems: See HPI, otherwise negative ROS  Physical Exam: BP (!) 121/59   Temp (!) 97.4 F (36.3 C) (Temporal)   Resp 20   Ht '5\' 1"'$  (1.549 m)   Wt 68 kg   SpO2 96%   BMI 28.34 kg/m  General:   Alert, cooperative in NAD Head:  Normocephalic and atraumatic. Respiratory:  Normal work of breathing. Cardiovascular:  RRR  Impression/Plan: Abigail Willis is here for cataract surgery.  Risks, benefits, limitations, and  alternatives regarding cataract surgery have been reviewed with the patient.  Questions have been answered.  All parties agreeable.   Birder Robson, MD  10/08/2021, 9:47 AM

## 2021-10-08 NOTE — Op Note (Signed)
PREOPERATIVE DIAGNOSIS:  Nuclear sclerotic cataract of the left eye.   POSTOPERATIVE DIAGNOSIS:  Nuclear sclerotic cataract of the left eye.   OPERATIVE PROCEDURE:ORPROCALL@   SURGEON:  Birder Robson, MD.   ANESTHESIA:  Anesthesiologist: April Manson, MD CRNA: Jeannene Patella, CRNA  1.      Managed anesthesia care. 2.     0.63m of Shugarcaine was instilled following the paracentesis   COMPLICATIONS:  None.   TECHNIQUE:   Stop and chop   DESCRIPTION OF PROCEDURE:  The patient was examined and consented in the preoperative holding area where the aforementioned topical anesthesia was applied to the left eye and then brought back to the Operating Room where the left eye was prepped and draped in the usual sterile ophthalmic fashion and a lid speculum was placed. A paracentesis was created with the side port blade and the anterior chamber was filled with viscoelastic. A near clear corneal incision was performed with the steel keratome. A continuous curvilinear capsulorrhexis was performed with a cystotome followed by the capsulorrhexis forceps. Hydrodissection and hydrodelineation were carried out with BSS on a blunt cannula. The lens was removed in a stop and chop  technique and the remaining cortical material was removed with the irrigation-aspiration handpiece. The capsular bag was inflated with viscoelastic and the Technis ZCB00 lens was placed in the capsular bag without complication. The remaining viscoelastic was removed from the eye with the irrigation-aspiration handpiece. The wounds were hydrated. The anterior chamber was flushed with BSS and the eye was inflated to physiologic pressure. 0.133mVigamox was placed in the anterior chamber. The wounds were found to be water tight. The eye was dressed with Combigan. The patient was given protective glasses to wear throughout the day and a shield with which to sleep tonight. The patient was also given drops with which to begin a drop  regimen today and will follow-up with me in one day. Implant Name Type Inv. Item Serial No. Manufacturer Lot No. LRB No. Used Action  LENS IOL TECNIS EYHANCE 20.5 - [id number] Lens LENS IOL TECNIS EYHANCE 20.5 [id number]  Left 1 Implanted    Procedure(s) with comments: CATARACT EXTRACTION PHACO AND INTRAOCULAR LENS PLACEMENT (IOC) LEFT (Left) - 9.98 0:57.7  Electronically signed: WiBirder Robson/23/2023 10:11 AM

## 2021-10-09 ENCOUNTER — Encounter: Payer: Self-pay | Admitting: Ophthalmology

## 2021-10-23 ENCOUNTER — Encounter: Payer: Self-pay | Admitting: Family

## 2021-10-23 ENCOUNTER — Ambulatory Visit (INDEPENDENT_AMBULATORY_CARE_PROVIDER_SITE_OTHER): Payer: Medicare Other | Admitting: Family

## 2021-10-23 VITALS — BP 124/70 | HR 76 | Temp 98.2°F | Ht 61.0 in | Wt 150.0 lb

## 2021-10-23 DIAGNOSIS — M810 Age-related osteoporosis without current pathological fracture: Secondary | ICD-10-CM | POA: Diagnosis not present

## 2021-10-23 DIAGNOSIS — E278 Other specified disorders of adrenal gland: Secondary | ICD-10-CM

## 2021-10-23 DIAGNOSIS — E538 Deficiency of other specified B group vitamins: Secondary | ICD-10-CM

## 2021-10-23 DIAGNOSIS — Z23 Encounter for immunization: Secondary | ICD-10-CM

## 2021-10-23 DIAGNOSIS — Z1231 Encounter for screening mammogram for malignant neoplasm of breast: Secondary | ICD-10-CM

## 2021-10-23 DIAGNOSIS — I2510 Atherosclerotic heart disease of native coronary artery without angina pectoris: Secondary | ICD-10-CM

## 2021-10-23 DIAGNOSIS — Z0000 Encounter for general adult medical examination without abnormal findings: Secondary | ICD-10-CM

## 2021-10-23 LAB — COMPREHENSIVE METABOLIC PANEL
ALT: 13 U/L (ref 0–35)
AST: 18 U/L (ref 0–37)
Albumin: 4.1 g/dL (ref 3.5–5.2)
Alkaline Phosphatase: 69 U/L (ref 39–117)
BUN: 11 mg/dL (ref 6–23)
CO2: 30 mEq/L (ref 19–32)
Calcium: 9 mg/dL (ref 8.4–10.5)
Chloride: 102 mEq/L (ref 96–112)
Creatinine, Ser: 0.59 mg/dL (ref 0.40–1.20)
GFR: 87.53 mL/min (ref >60.00)
Glucose, Bld: 91 mg/dL (ref 70–99)
Potassium: 4.2 mEq/L (ref 3.5–5.1)
Sodium: 139 mEq/L (ref 135–145)
Total Bilirubin: 0.5 mg/dL (ref 0.2–1.2)
Total Protein: 6.5 g/dL (ref 6.0–8.3)

## 2021-10-23 LAB — LIPID PANEL
Cholesterol: 183 mg/dL (ref 0–200)
HDL: 81.2 mg/dL (ref >39.00)
LDL Cholesterol: 84 mg/dL (ref 0–99)
NonHDL: 101.81
Total CHOL/HDL Ratio: 2
Triglycerides: 90 mg/dL (ref 0.0–149.0)
VLDL: 18 mg/dL (ref 0.0–40.0)

## 2021-10-23 LAB — B12 AND FOLATE PANEL
Folate: 12 ng/mL (ref >5.9)
Vitamin B-12: 397 pg/mL (ref 211–911)

## 2021-10-23 LAB — TSH: TSH: 1.87 u[IU]/mL (ref 0.35–5.50)

## 2021-10-23 LAB — VITAMIN D 25 HYDROXY (VIT D DEFICIENCY, FRACTURES): VITD: 47.12 ng/mL (ref 30.00–100.00)

## 2021-10-23 LAB — HEMOGLOBIN A1C: Hgb A1c MFr Bld: 5.6 % (ref 4.6–6.5)

## 2021-10-23 NOTE — Patient Instructions (Addendum)
Please call [facility] to schedule both bone density and mammogram late September.    Please ensure as well [facility] pulmonology contact you for CT lung cancer screening this is due in August  Health Maintenance for Postmenopausal Women Menopause is a normal process in which your ability to get pregnant comes to an end. This process happens slowly over many months or years, usually between the ages of 61 and 54. Menopause is complete when you have missed your menstrual period for 12 months. It is important to talk with your health care provider about some of the most common conditions that affect women after menopause (postmenopausal women). These include heart disease, cancer, and bone loss (osteoporosis). Adopting a healthy lifestyle and getting preventive care can help to promote your health and wellness. The actions you take can also lower your chances of developing some of these common conditions. What are the signs and symptoms of menopause? During menopause, you may have the following symptoms: Hot flashes. These can be moderate or severe. Night sweats. Decrease in sex drive. Mood swings. Headaches. Tiredness (fatigue). Irritability. Memory problems. Problems falling asleep or staying asleep. Talk with your health care provider about treatment options for your symptoms. Do I need hormone replacement therapy? Hormone replacement therapy is effective in treating symptoms that are caused by menopause, such as hot flashes and night sweats. Hormone replacement carries certain risks, especially as you become older. If you are thinking about using estrogen or estrogen with progestin, discuss the benefits and risks with your health care provider. How can I reduce my risk for heart disease and stroke? The risk of heart disease, heart attack, and stroke increases as you age. One of the causes may be a change in the body's hormones during menopause. This can affect how your body uses  dietary fats, triglycerides, and cholesterol. Heart attack and stroke are medical emergencies. There are many things that you can do to help prevent heart disease and stroke. Watch your blood pressure High blood pressure causes heart disease and increases the risk of stroke. This is more likely to develop in people who have high blood pressure readings or are overweight. Have your blood pressure checked: Every 3-5 years if you are 21-50 years of age. Every year if you are 59 years old or older. Eat a healthy diet  Eat a diet that includes plenty of vegetables, fruits, low-fat dairy products, and lean protein. Do not eat a lot of foods that are high in solid fats, added sugars, or sodium. Get regular exercise Get regular exercise. This is one of the most important things you can do for your health. Most adults should: Try to exercise for at least 150 minutes each week. The exercise should increase your heart rate and make you sweat (moderate-intensity exercise). Try to do strengthening exercises at least twice each week. Do these in addition to the moderate-intensity exercise. Spend less time sitting. Even light physical activity can be beneficial. Other tips Work with your health care provider to achieve or maintain a healthy weight. Do not use any products that contain nicotine or tobacco. These products include cigarettes, chewing tobacco, and vaping devices, such as e-cigarettes. If you need help quitting, ask your health care provider. Know your numbers. Ask your health care provider to check your cholesterol and your blood sugar (glucose). Continue to have your blood tested as directed by your health care provider. Do I need screening for cancer? Depending on your health history and family history, you may  need to have cancer screenings at different stages of your life. This may include screening for: Breast cancer. Cervical cancer. Lung cancer. Colorectal cancer. What is my risk for  osteoporosis? After menopause, you may be at increased risk for osteoporosis. Osteoporosis is a condition in which bone destruction happens more quickly than new bone creation. To help prevent osteoporosis or the bone fractures that can happen because of osteoporosis, you may take the following actions: If you are 10-50 years old, get at least 1,000 mg of calcium and at least 600 international units (IU) of vitamin D per day. If you are older than age 68 but younger than age 62, get at least 1,200 mg of calcium and at least 600 international units (IU) of vitamin D per day. If you are older than age 10, get at least 1,200 mg of calcium and at least 800 international units (IU) of vitamin D per day. Smoking and drinking excessive alcohol increase the risk of osteoporosis. Eat foods that are rich in calcium and vitamin D, and do weight-bearing exercises several times each week as directed by your health care provider. How does menopause affect my mental health? Depression may occur at any age, but it is more common as you become older. Common symptoms of depression include: Feeling depressed. Changes in sleep patterns. Changes in appetite or eating patterns. Feeling an overall lack of motivation or enjoyment of activities that you previously enjoyed. Frequent crying spells. Talk with your health care provider if you think that you are experiencing any of these symptoms. General instructions See your health care provider for regular wellness exams and vaccines. This may include: Scheduling regular health, dental, and eye exams. Getting and maintaining your vaccines. These include: Influenza vaccine. Get this vaccine each year before the flu season begins. Pneumonia vaccine. Shingles vaccine. Tetanus, diphtheria, and pertussis (Tdap) booster vaccine. Your health care provider may also recommend other immunizations. Tell your health care provider if you have ever been abused or do not feel safe at  home. Summary Menopause is a normal process in which your ability to get pregnant comes to an end. This condition causes hot flashes, night sweats, decreased interest in sex, mood swings, headaches, or lack of sleep. Treatment for this condition may include hormone replacement therapy. Take actions to keep yourself healthy, including exercising regularly, eating a healthy diet, watching your weight, and checking your blood pressure and blood sugar levels. Get screened for cancer and depression. Make sure that you are up to date with all your vaccines. This information is not intended to replace advice given to you by your health care provider. Make sure you discuss any questions you have with your health care provider. Document Revised: 09/24/2020 Document Reviewed: 09/24/2020 Elsevier Patient Education  [place].

## 2021-10-23 NOTE — Progress Notes (Addendum)
Subjective:    Patient ID: Abigail Willis, female    DOB: 1946/04/20, 76 y.o.   MRN: [id number]  CC: Analeya Luallen Berline Willis is a 76 y.o. female who presents today for physical exam.    HPI: Feels well today.  No new complaints.  She is enjoying living in [place] 6 months out of the year.  H/o skin cancer- following annually in [place] with dermatology  Following with Dr Janese Banks for elevated hemoglobin  CT calcium score 09/2021 showed disease mild, nonobstructive CAD.  Continue aspirin and statin therapy, per Murray Hodgkins, NP  Adrenal anomaly/ adrenal hyperplasia -consult with Dr. Loanne Drilling 01/14/21 , endocrine with normal metanephrine , catecholamine labs 02/15/2021.  Colorectal Cancer Screening: UTD , due in 5 years Breast Cancer Screening: Mammogram UTD  Cervical Cancer Screening:  No longer screening for  cervical cancer.  Denies pelvic pain, abdominal distention, vaginal bleeding Bone Health screening/DEXA for 65+:Due 01/2022  Lung Cancer Screening: Participating, due 12/2021         Tetanus - due        Pneumococcal - Candidate for PCV20  Labs: Screening labs today. Exercise: Gets regular exercise, walking. No cp, sob Alcohol use:  occasional Smoking/tobacco use: Nonsmoker.     HISTORY:  Past Medical History:  Diagnosis Date   Allergy    SINUS   Cancer ([place])    skin   Cataract    bilateral   Colon polyps 11/14/2013   Tubular adenoma, hyperplastic polyps   COPD (chronic obstructive pulmonary disease) (HCC)    Diverticulosis 11/15/2010   Moderate   External hemorrhoids    Hyperlipidemia    IBS (irritable bowel syndrome)    Osteoporosis    Squamous cell carcinoma in situ    Vertigo     Past Surgical History:  Procedure Laterality Date   BREAST BIOPSY     CATARACT EXTRACTION W/PHACO Right 09/24/2021   Procedure: CATARACT EXTRACTION PHACO AND INTRAOCULAR LENS PLACEMENT ([place]) RIGHT;  Surgeon: Birder Robson, MD;  Location: [facility];   Service: Ophthalmology;  Laterality: Right;  15.97 01:15.5   CATARACT EXTRACTION W/PHACO Left 10/08/2021   Procedure: CATARACT EXTRACTION PHACO AND INTRAOCULAR LENS PLACEMENT ([place]) LEFT;  Surgeon: Birder Robson, MD;  Location: [facility];  Service: Ophthalmology;  Laterality: Left;  9.98 0:57.7   COLONOSCOPY     GANGLION CYST EXCISION     HAMMER TOE SURGERY     HEMORRHOID BANDING     LEEP      twice, in [place]  2019   TONSILLECTOMY     TUBAL LIGATION     Family History  Problem Relation Age of Onset   Heart attack Mother 44   Ovarian cancer Mother    Asthma Father    Brain cancer Sister    Colon cancer Cousin    Breast cancer Neg Hx    Adrenal disorder Neg Hx    Esophageal cancer Neg Hx    Rectal cancer Neg Hx    Stomach cancer Neg Hx       ALLERGIES: Avelox [moxifloxacin hcl in nacl] and Penicillins  Current Outpatient Medications on File Prior to Visit  Medication Sig Dispense Refill   acetaminophen (TYLENOL) 500 MG tablet Take 500 mg by mouth as needed.     aspirin 81 MG tablet Take 81 mg by mouth daily.     cetirizine (ZYRTEC) 10 MG tablet Take 10 mg by mouth daily.     cyanocobalamin (,  VITAMIN B-12,) 1000 MCG/ML injection Inject 71m into intramuscularly one time per month. 10 mL 0   hyoscyamine (LEVBID) 0.375 MG 12 hr tablet Take 1 tablet (0.375 mg total) by mouth every 12 (twelve) hours as needed. 60 tablet 6   ibuprofen (ADVIL,MOTRIN) 200 MG tablet Take 200 mg by mouth as needed.     loperamide (IMODIUM A-D) 2 MG tablet Take 1 tablet (2 mg total) by mouth in the morning. 30 tablet 0   NEEDLE, DISP, 25 G (BD DISP NEEDLES) 25G X 5/8" MISC Used to draw monthly B-12 injections. 50 each 0   pravastatin (PRAVACHOL) 20 MG tablet Take 1 tablet (20 mg total) by mouth at bedtime. 90 tablet 3   pseudoephedrine-guaifenesin (MUCINEX D) 60-600 MG per tablet Take 1 tablet by mouth every 12 (twelve) hours as needed.     sodium chloride (OCEAN) 0.65 % SOLN  nasal spray Place 1 spray into both nostrils as needed for congestion.     SYRINGE-NEEDLE, DISP, 3 ML (BD ECLIPSE SYRINGE) 25G X 1" 3 ML MISC Used to give monthly B-12 injections. 50 each 0   No current facility-administered medications on file prior to visit.    Social History   Tobacco Use   Smoking status: Every Day    Packs/day: 1.00    Years: 52.00    Pack years: 52.00    Types: Cigarettes   Smokeless tobacco: Never  Vaping Use   Vaping Use: Never used  Substance Use Topics   Alcohol use: Yes    Alcohol/week: 7.0 standard drinks    Types: 7 Glasses of wine per week    Comment: SOCIAL   Drug use: No    Review of Systems  Constitutional:  Negative for chills, fever and unexpected weight change.  HENT:  Negative for congestion.   Respiratory:  Negative for cough.   Cardiovascular:  Negative for chest pain, palpitations and leg swelling.  Gastrointestinal:  Negative for nausea and vomiting.  Genitourinary:  Negative for pelvic pain and vaginal bleeding.  Musculoskeletal:  Negative for arthralgias and myalgias.  Skin:  Negative for rash.  Neurological:  Negative for headaches.  Hematological:  Negative for adenopathy.  Psychiatric/Behavioral:  Negative for confusion.      Objective:    BP 124/70 (BP Location: Left Arm, Patient Position: Sitting, Cuff Size: Normal)   Pulse 76   Temp 98.2 F (36.8 C) (Oral)   Ht '5\' 1"'$  (1.549 m)   Wt 150 lb (68 kg)   SpO2 98%   BMI 28.34 kg/m   BP Readings from Last 3 Encounters:  10/23/21 124/70  10/08/21 (!) 107/55  10/07/21 117/64   Wt Readings from Last 3 Encounters:  10/23/21 150 lb (68 kg)  10/08/21 150 lb (68 kg)  09/24/21 151 lb 6.4 oz (68.7 kg)    Physical Exam Vitals reviewed.  Constitutional:      Appearance: Normal appearance. She is well-developed.  Eyes:     Conjunctiva/sclera: Conjunctivae normal.  Neck:     Thyroid: No thyroid mass or thyromegaly.  Cardiovascular:     Rate and Rhythm: Normal rate and  regular rhythm.     Pulses: Normal pulses.     Heart sounds: Normal heart sounds.  Pulmonary:     Effort: Pulmonary effort is normal.     Breath sounds: Normal breath sounds. No wheezing, rhonchi or rales.  Chest:  Breasts:    Breasts are symmetrical.     Right: No inverted nipple, mass, nipple discharge,  skin change or tenderness.     Left: No inverted nipple, mass, nipple discharge, skin change or tenderness.  Abdominal:     General: Bowel sounds are normal. There is no distension.     Palpations: Abdomen is soft. Abdomen is not rigid. There is no fluid wave or mass.     Tenderness: There is no abdominal tenderness. There is no guarding or rebound.  Lymphadenopathy:     Head:     Right side of head: No submental, submandibular, tonsillar, preauricular, posterior auricular or occipital adenopathy.     Left side of head: No submental, submandibular, tonsillar, preauricular, posterior auricular or occipital adenopathy.     Cervical: No cervical adenopathy.     Right cervical: No superficial, deep or posterior cervical adenopathy.    Left cervical: No superficial, deep or posterior cervical adenopathy.  Skin:    General: Skin is warm and dry.  Neurological:     Mental Status: She is alert.  Psychiatric:        Speech: Speech normal.        Behavior: Behavior normal.        Thought Content: Thought content normal.       Assessment & Plan:   Problem List Items Addressed This Visit       Cardiovascular and Mediastinum   Coronary atherosclerosis    Chronic, symptomatically stable.  CT calcium score 09/2021 showed disease mild, nonobstructive CAD.  Continue aspirin and statin therapy, per Murray Hodgkins, NP She is compliant with Pravachol 20 mg, aspirin 81 mg        Endocrine   Adrenal hyperplasia ([place])    Reviewed work-up with Dr. Loanne Drilling, endocrine, normal metanephrine and catecholamine labs 02/15/2021. CT chest 01/07/22 Stable bilateral adrenal adenomas measuring 1.5  cm with density -4 HU on the right and 2.0 cm with density -20 HU on the left. We agreed to continue to monitor bilateral adrenal adenomas on CT chest lung cancer screening annually going forward.  She has upcoming CT lung cancer screening due August 2023.  She will ensure that it is scheduled through pulmonology.         Musculoskeletal and Integument   Osteoporosis   Relevant Orders   DG Bone Density     Other   B12 deficiency    She is no longer on B12 IM.  She did not derive benefit including increased energy while on B12.  If B12 is normal, advised she may just focus on consuming B12 in her diet       Relevant Orders   B12 and Folate Panel   Routine physical examination - Primary    Clinical breast exam performed.  Deferred pelvic exam in the absence of symptoms and  patient is no longer screening for cervical cancer. patient will schedule mammogram and bone density due September of this year.  Encourage continued walking.  She will continue following annually with dermatology in [place].       Relevant Orders   Comprehensive metabolic panel   Hemoglobin A1c   Lipid panel   TSH   VITAMIN D 25 Hydroxy (Vit-D Deficiency, Fractures)   Other Visit Diagnoses     Encounter for screening mammogram for malignant neoplasm of breast       Relevant Orders   MM 3D SCREEN BREAST BILATERAL   Need for pneumococcal vaccine       Relevant Orders   Pneumococcal conjugate vaccine 20-valent (Prevnar 20) (Completed)  I have discontinued Milarose M. Pekar Willits's metoprolol tartrate. I am also having her maintain her aspirin, ibuprofen, pseudoephedrine-guaifenesin, acetaminophen, sodium chloride, loperamide, hyoscyamine, pravastatin, cyanocobalamin, BD Eclipse Syringe, BD Disp Needles, and cetirizine.   No orders of the defined types were placed in this encounter.   Return precautions given.   Risks, benefits, and alternatives of the medications and treatment plan prescribed  today were discussed, and patient expressed understanding.   Education regarding symptom management and diagnosis given to patient on AVS.   Continue to follow with Burnard Hawthorne, FNP for routine health maintenance.   [place] and I agreed with plan.   Mable Paris, FNP

## 2021-10-23 NOTE — Assessment & Plan Note (Signed)
Chronic, symptomatically stable.  CT calcium score 09/2021 showed disease mild, nonobstructive CAD.  Continue aspirin and statin therapy, per Murray Hodgkins, NP She is compliant with Pravachol 20 mg, aspirin 81 mg

## 2021-10-23 NOTE — Assessment & Plan Note (Signed)
She is no longer on B12 IM.  She did not derive benefit including increased energy while on B12.  If B12 is normal, advised she may just focus on consuming B12 in her diet

## 2021-10-23 NOTE — Assessment & Plan Note (Signed)
Reviewed work-up with Dr. Loanne Drilling, endocrine, normal metanephrine and catecholamine labs 02/15/2021. CT chest 01/07/22 Stable bilateral adrenal adenomas measuring 1.5 cm with density -4 HU on the right and 2.0 cm with density -20 HU on the left. We agreed to continue to monitor bilateral adrenal adenomas on CT chest lung cancer screening annually going forward.  She has upcoming CT lung cancer screening due August 2023.  She will ensure that it is scheduled through pulmonology.

## 2021-10-23 NOTE — Assessment & Plan Note (Signed)
Clinical breast exam performed.  Deferred pelvic exam in the absence of symptoms and  patient is no longer screening for cervical cancer. patient will schedule mammogram and bone density due September of this year.  Encourage continued walking.  She will continue following annually with dermatology in [place].

## 2021-12-10 ENCOUNTER — Telehealth: Payer: Self-pay | Admitting: Gastroenterology

## 2021-12-10 NOTE — Telephone Encounter (Signed)
The pt has a history of internal and external hemorrhoids.  She had a bowel movement and noticed that there was BRBPR when she wiped.  She says that the bleeding has stopped for now.  She is going to try prep H, sitz baths and tucks pads. She will also keep her bowels soft.  She has been scheduled for follow up with Dr Ardis Hughs.  She will call back if the bleeding does not stop or worsens.

## 2021-12-10 NOTE — Telephone Encounter (Signed)
Inbound call from patient stating that she was going to go to her pool and noticed that she was having a lot of rectal blood. Patient is requesting a call back to discuss. Please advise.

## 2021-12-28 ENCOUNTER — Other Ambulatory Visit: Payer: Self-pay | Admitting: Family

## 2022-01-07 ENCOUNTER — Ambulatory Visit
Admission: RE | Admit: 2022-01-07 | Discharge: 2022-01-07 | Disposition: A | Discharge status: Home / Self Care | Payer: Medicare Other | Source: Ambulatory Visit | Attending: Acute Care | Admitting: Acute Care

## 2022-01-07 DIAGNOSIS — F17210 Nicotine dependence, cigarettes, uncomplicated: Secondary | ICD-10-CM | POA: Insufficient documentation

## 2022-01-07 DIAGNOSIS — J439 Emphysema, unspecified: Secondary | ICD-10-CM | POA: Insufficient documentation

## 2022-01-07 DIAGNOSIS — I2510 Atherosclerotic heart disease of native coronary artery without angina pectoris: Secondary | ICD-10-CM | POA: Insufficient documentation

## 2022-01-07 DIAGNOSIS — Z122 Encounter for screening for malignant neoplasm of respiratory organs: Secondary | ICD-10-CM | POA: Diagnosis present

## 2022-01-07 DIAGNOSIS — R911 Solitary pulmonary nodule: Secondary | ICD-10-CM | POA: Insufficient documentation

## 2022-01-07 DIAGNOSIS — Z87891 Personal history of nicotine dependence: Secondary | ICD-10-CM

## 2022-01-07 DIAGNOSIS — I700 Atherosclerosis of aorta: Secondary | ICD-10-CM | POA: Insufficient documentation

## 2022-01-08 ENCOUNTER — Ambulatory Visit: Payer: Medicare Other | Admitting: Gastroenterology

## 2022-01-08 ENCOUNTER — Encounter: Payer: Self-pay | Admitting: Gastroenterology

## 2022-01-08 VITALS — BP 130/80 | HR 81 | Ht 61.0 in | Wt 151.4 lb

## 2022-01-08 DIAGNOSIS — K921 Melena: Secondary | ICD-10-CM | POA: Diagnosis not present

## 2022-01-08 DIAGNOSIS — K640 First degree hemorrhoids: Secondary | ICD-10-CM | POA: Diagnosis not present

## 2022-01-08 DIAGNOSIS — K580 Irritable bowel syndrome with diarrhea: Secondary | ICD-10-CM

## 2022-01-08 NOTE — Patient Instructions (Signed)
If you are age 77 or older, your body mass index should be between 23-30. Your Body mass index is 28.61 kg/m. If this is out of the aforementioned range listed, please consider follow up with your Primary Care Provider.  __________________________________________________________  The [facility] providers would like to encourage you to use [facility] to communicate with providers for non-urgent requests or questions.  Due to long hold times on the telephone, sending your provider a message by [facility] may be a faster and more efficient way to get a response.  Please allow 48 business hours for a response.  Please remember that this is for non-urgent requests.   Due to recent changes in healthcare laws, you may see the results of your imaging and laboratory studies on MyChart before your provider has had a chance to review them.  We understand that in some cases there may be results that are confusing or concerning to you. Not all laboratory results come back in the same time frame and the provider may be waiting for multiple results in order to interpret others.  Please give [place] 48 hours in order for your provider to thoroughly review all the results before contacting the office for clarification of your results.   Follow up as needed.  Thank you for choosing me and [facility].  Vito Cirigliano, D.O.

## 2022-01-08 NOTE — Progress Notes (Signed)
Chief Complaint: Hematochezia  GI history: 76 year old female follows in the GI clinic for the following:  1. Adenomatous polyps:  Colonoscopy 10/2010 Dr. Ardis Hughs (for previous adenomatous polyps in [facility] including a 1cm pedunculated polyp in 1998) 4 subCM polyps removed, one was adenomatous, She was recommended to have repeat colonoscopy at 5 year interval.    Colonoscopy September 2017 Dr. Ardis Hughs found 3 subcentimeter polyps.  Also internal and external hemorrhoids.  2 of these polyps were sessile serrated adenomas, the other was hyperplastic.  Colonoscopy June 2022 normal terminal ileum, the entire colon appeared slightly erythematous.  Biopsies were taken from right and left colon.  Hemorrhoids were found, medium size.  No polyps or cancers.  Biopsies showed no sign of micro or macro scopic colitis 2. IBS-D: with abd cramping; probiotics and PRN levbid and Imodium has helped. 3.  Internal and external hemorrhoids: Internal Hemorrhoid banding procedures May 2018 by Dr. Carlean Purl.  HPI:     Abigail Willis is a 76 y.o. female referred to the Gastroenterology Clinic for evaluation of recent onset hematochezia and symptomatic hemorrhoids.  She was last seen by Dr. Ardis Hughs on 01/29/2021.  Main issue at that time was loose stools around times of stress.  Symptoms controlled with Levbid and Imodium on as-needed basis.  Today, her main issue is recent episode of BRBPR.  She reports having intermittent episodes of scant BRB on tissue paper since hemorrhoidal banding in 09/2016.  However, single episode of large-volume bleeding in July.  No associated dyschezia, abdominal pain, constipation, straining.  Those symptoms have not recurred.       Latest Ref Rng & Units 09/17/2021   12:59 PM 02/12/2021   12:51 PM 09/25/2020   11:54 AM  CBC  WBC 4.0 - 10.5 K/uL 9.5  7.9  8.6   Hemoglobin 12.0 - 15.0 g/dL 15.9  15.3  16.1   Hematocrit 36.0 - 46.0 % 48.1  45.3  48.9   Platelets 150 -  400 K/uL 266  282  264       Latest Ref Rng & Units 10/23/2021    8:50 AM 10/07/2021    8:35 AM 11/29/2019    9:45 AM  CMP  Glucose 70 - 99 mg/dL 91   101   BUN 6 - 23 mg/dL 11   9   Creatinine 0.40 - 1.20 mg/dL 0.59  0.50  0.57   Sodium 135 - 145 mEq/L 139   139   Potassium 3.5 - 5.1 mEq/L 4.2   4.2   Chloride 96 - 112 mEq/L 102   104   CO2 19 - 32 mEq/L 30   28   Calcium 8.4 - 10.5 mg/dL 9.0   9.3   Total Protein 6.0 - 8.3 g/dL 6.5   6.9   Total Bilirubin 0.2 - 1.2 mg/dL 0.5   0.4   Alkaline Phos 39 - 117 U/L 69   70   AST 0 - 37 U/L 18   16   ALT 0 - 35 U/L 13   11       Past Medical History:  Diagnosis Date   Allergy    SINUS   Cancer ([place])    skin   Cataract    bilateral   Colon polyps 11/14/2013   Tubular adenoma, hyperplastic polyps   COPD (chronic obstructive pulmonary disease) ([place])    Diverticulosis 11/15/2010   Moderate   External  hemorrhoids    Hyperlipidemia    IBS (irritable bowel syndrome)    Osteoporosis    Squamous cell carcinoma in situ    Vertigo      Past Surgical History:  Procedure Laterality Date   BREAST BIOPSY     CATARACT EXTRACTION W/PHACO Right 09/24/2021   Procedure: CATARACT EXTRACTION PHACO AND INTRAOCULAR LENS PLACEMENT ([place]) RIGHT;  Surgeon: Birder Robson, MD;  Location: [facility];  Service: Ophthalmology;  Laterality: Right;  15.97 01:15.5   CATARACT EXTRACTION W/PHACO Left 10/08/2021   Procedure: CATARACT EXTRACTION PHACO AND INTRAOCULAR LENS PLACEMENT ([place]) LEFT;  Surgeon: Birder Robson, MD;  Location: [facility];  Service: Ophthalmology;  Laterality: Left;  9.98 0:57.7   COLONOSCOPY     GANGLION CYST EXCISION     HAMMER TOE SURGERY     HEMORRHOID BANDING     LEEP      twice, in [place]  2019   TONSILLECTOMY     TUBAL LIGATION     Family History  Problem Relation Age of Onset   Heart attack Mother 38   Ovarian cancer Mother    Asthma Father    Brain cancer Sister    Colon cancer  Cousin    Breast cancer Neg Hx    Adrenal disorder Neg Hx    Esophageal cancer Neg Hx    Rectal cancer Neg Hx    Stomach cancer Neg Hx    Social History   Tobacco Use   Smoking status: Every Day    Packs/day: 1.00    Years: 52.00    Total pack years: 52.00    Types: Cigarettes   Smokeless tobacco: Never  Vaping Use   Vaping Use: Never used  Substance Use Topics   Alcohol use: Yes    Alcohol/week: 7.0 standard drinks of alcohol    Types: 7 Glasses of wine per week    Comment: SOCIAL   Drug use: No   Current Outpatient Medications  Medication Sig Dispense Refill   acetaminophen (TYLENOL) 500 MG tablet Take 500 mg by mouth as needed.     AMBULATORY NON FORMULARY MEDICATION 1 Dose by Intravitreal route every 6 (six) weeks. Medication Name: Avastin     aspirin 81 MG tablet Take 81 mg by mouth daily.     cetirizine (ZYRTEC) 10 MG tablet Take 10 mg by mouth daily.     hyoscyamine (LEVBID) 0.375 MG 12 hr tablet Take 1 tablet (0.375 mg total) by mouth every 12 (twelve) hours as needed. 60 tablet 6   ibuprofen (ADVIL,MOTRIN) 200 MG tablet Take 200 mg by mouth as needed.     loperamide (IMODIUM A-D) 2 MG tablet Take 1 tablet (2 mg total) by mouth in the morning. 30 tablet 0   pravastatin (PRAVACHOL) 20 MG tablet TAKE 1 TABLET(20 MG) BY MOUTH AT BEDTIME 90 tablet 3   pseudoephedrine-guaifenesin (MUCINEX D) 60-600 MG per tablet Take 1 tablet by mouth every 12 (twelve) hours as needed.     sodium chloride (OCEAN) 0.65 % SOLN nasal spray Place 1 spray into both nostrils as needed for congestion.     cyanocobalamin (,VITAMIN B-12,) 1000 MCG/ML injection Inject 55m into intramuscularly one time per month. (Patient not taking: Reported on 01/08/2022) 10 mL 0   NEEDLE, DISP, 25 G (BD DISP NEEDLES) 25G X 5/8" MISC Used to draw monthly B-12 injections. (Patient not taking: Reported on 01/08/2022) 50 each 0   SYRINGE-NEEDLE, DISP, 3 ML (BD ECLIPSE  SYRINGE) 25G X 1" 3 ML MISC Used to give monthly B-12  injections. (Patient not taking: Reported on 01/08/2022) 50 each 0   No current facility-administered medications for this visit.   Allergies  Allergen Reactions   Avelox [Moxifloxacin Hcl In Nacl] Other (See Comments)    Severe stomach cramping and nausea   Penicillins Rash     Review of Systems: All systems reviewed and negative except where noted in HPI.     Physical Exam:    Wt Readings from Last 3 Encounters:  01/08/22 151 lb 6.4 oz (68.7 kg)  10/23/21 150 lb (68 kg)  10/08/21 150 lb (68 kg)    BP 130/80 (BP Location: Right Arm, Patient Position: Sitting, Cuff Size: Normal)   Pulse 81   Ht '5\' 1"'$  (1.549 m)   Wt 151 lb 6.4 oz (68.7 kg)   SpO2 96%   BMI 28.61 kg/m  Constitutional:  Pleasant, in no acute distress. Psychiatric: Normal mood and affect. Behavior is normal. Abdominal: Nondistended Neurological: Alert and oriented to person place and time. Skin: Skin is warm and dry. No rashes noted. Rectal exam: Sensation intact and preserved anal wink.  External skin tags present.  No external anal fissures, external hemorrhoids. Normal sphincter tone. No palpable mass. No blood on the exam glove.  Anoscopy with grade 1 hemorrhoid in LL and RP position.  (Chaperone: Renee Rival, CMA)   ASSESSMENT AND PLAN;   1) Internal hemorrhoids History of internal hemorrhoids, and has otherwise had a good clinical response to hemorrhoid banding in the past.  Recent single episode of large-volume BRBPR, very highly likely 2/2 hemorrhoidal bleeding based on recency of colonoscopy and clinical description. - Discussed role/utility of repeat hemorrhoid banding today for the grade 1 hemorrhoid columns.  Based on exam today and no recurrence of large-volume bleeding, preferred conservative management - Plan for conservative management.  Ok to use Preparation H suppository if recurrence - If symptoms recur, can recall the office for hemorrhoid banding  2) IBS-D - Well-controlled on current  therapy  3) History of colon polyps - Most recent colonoscopy in 10/2020 was normal.  Per Dr. Ardis Hughs, no recall needed based on age and normal recent colonoscopy  RTC prn   Lavena Bullion, DO, [facility]  01/08/2022, 10:57 AM   Arnett, Yvetta Coder, FNP

## 2022-01-10 ENCOUNTER — Telehealth: Payer: Self-pay | Admitting: Acute Care

## 2022-01-10 ENCOUNTER — Ambulatory Visit (INDEPENDENT_AMBULATORY_CARE_PROVIDER_SITE_OTHER): Payer: Medicare Other

## 2022-01-10 VITALS — Ht 61.0 in | Wt 151.0 lb

## 2022-01-10 DIAGNOSIS — Z0000 Encounter for general adult medical examination without abnormal findings: Secondary | ICD-10-CM | POA: Diagnosis not present

## 2022-01-10 NOTE — Telephone Encounter (Signed)
I have called the patient with the results of her low-dose screening CT.  I explained that her scan was read as a Lung RADS 4 A : suspicious findings, either short term follow up in 3 months or alternatively  PET Scan evaluation may be considered when there is a solid component of  8 mm or larger. She is in agreement with a 3 month follow up. She will be in [place] x 5 months starting in October. I have asked her to get her PCP in [place] to order either a low dose screening CT chest or a CT Chest without contrast as follow up to the finding of a New irregular solid pulmonary nodule of the left upper lobe measuring 6.4 mm . She is in agreement with this plan. She will call now and arrange for a follow up appointment as soon as she gets to [place] to ensure the scan is ordered for the end of November 2023.   Langley Gauss and [employer], please make a copy of the CT result  and mail to patient at her [place] address , so she can take it with her when she sees her PCP in [place]. ( Our printer is not working, so I could not do this before I left. ) Document on there that she needs a 3 month follow up LDCT or CT Chest without contrast. And ask that we are faxed the results so that we can follow up appropriately.   Thanks so much

## 2022-01-10 NOTE — Telephone Encounter (Signed)
I have messaged Dr. Patsey Berthold to make sure she is comfortable with a 3 month follow up scan. I am adding you both the the message, so if she does not answer until later today, you all with know what to do. Thanks so much all.

## 2022-01-10 NOTE — Progress Notes (Addendum)
Subjective:   Gentri Maxine Pekar [name] is a 76 y.o. female who presents for Medicare Annual (Subsequent) preventive examination.  Review of Systems    No ROS.  Medicare Wellness Virtual Visit.  Visual/audio telehealth visit, UTA vital signs.   See social history for additional risk factors.   Cardiac Risk Factors include: advanced age (>80mn, >>56women)     Objective:    Today's Vitals   01/10/22 0939  Weight: 151 lb (68.5 kg)  Height: '5\' 1"'$  (1.549 m)   Body mass index is 28.53 kg/m.     01/10/2022    9:58 AM 09/24/2021    7:37 AM 01/08/2021    3:00 PM 10/10/2020    1:45 PM 09/25/2020   10:59 AM 01/06/2020   11:51 AM 12/14/2018    8:43 AM  Advanced Directives  Does Patient Have a Medical Advance Directive? Yes Yes Yes Yes Yes Yes No  Type of AParamedicof ABeattyvilleLiving will HSt. HelenaLiving will HFlowellaLiving will HCastineLiving will HSanta BarbaraLiving will   Does patient want to make changes to medical advance directive? No - Patient declined No - Patient declined No - Patient declined No - Patient declined No - Patient declined No - Patient declined   Copy of HB[place]in Chart? No - copy requested No - copy requested No - copy requested  No - copy requested No - copy requested   Would patient like information on creating a medical advance directive?       No - Patient declined    Current Medications (verified) Outpatient Encounter Medications as of 01/10/2022  Medication Sig   acetaminophen (TYLENOL) 500 MG tablet Take 500 mg by mouth as needed.   AMBULATORY NON FORMULARY MEDICATION 1 Dose by Intravitreal route every 6 (six) weeks. Medication Name: Avastin   aspirin 81 MG tablet Take 81 mg by mouth daily.   cetirizine (ZYRTEC) 10 MG tablet Take 10 mg by mouth daily.   cyanocobalamin (,VITAMIN B-12,) 1000 MCG/ML injection  Inject 159minto intramuscularly one time per month. (Patient not taking: Reported on 01/08/2022)   hyoscyamine (LEVBID) 0.375 MG 12 hr tablet Take 1 tablet (0.375 mg total) by mouth every 12 (twelve) hours as needed.   ibuprofen (ADVIL,MOTRIN) 200 MG tablet Take 200 mg by mouth as needed.   loperamide (IMODIUM A-D) 2 MG tablet Take 1 tablet (2 mg total) by mouth in the morning.   NEEDLE, DISP, 25 G (BD DISP NEEDLES) 25G X 5/8" MISC Used to draw monthly B-12 injections. (Patient not taking: Reported on 01/08/2022)   pravastatin (PRAVACHOL) 20 MG tablet TAKE 1 TABLET(20 MG) BY MOUTH AT BEDTIME   pseudoephedrine-guaifenesin (MUCINEX D) 60-600 MG per tablet Take 1 tablet by mouth every 12 (twelve) hours as needed.   sodium chloride (OCEAN) 0.65 % SOLN nasal spray Place 1 spray into both nostrils as needed for congestion.   SYRINGE-NEEDLE, DISP, 3 ML (BD ECLIPSE SYRINGE) 25G X 1" 3 ML MISC Used to give monthly B-12 injections. (Patient not taking: Reported on 01/08/2022)   No facility-administered encounter medications on file as of 01/10/2022.    Allergies (verified) Avelox [moxifloxacin hcl in nacl] and Penicillins   History: Past Medical History:  Diagnosis Date   Allergy    SINUS   Cancer (HCTraill   skin   Cataract    bilateral   Colon polyps 11/14/2013  Tubular adenoma, hyperplastic polyps   COPD (chronic obstructive pulmonary disease) (HCC)    Diverticulosis 11/15/2010   Moderate   External hemorrhoids    Hyperlipidemia    IBS (irritable bowel syndrome)    Osteoporosis    Squamous cell carcinoma in situ    Vertigo    Past Surgical History:  Procedure Laterality Date   BREAST BIOPSY     CATARACT EXTRACTION W/PHACO Right 09/24/2021   Procedure: CATARACT EXTRACTION PHACO AND INTRAOCULAR LENS PLACEMENT ([place]) RIGHT;  Surgeon: Birder Robson, MD;  Location: [facility];  Service: Ophthalmology;  Laterality: Right;  15.97 01:15.5   CATARACT EXTRACTION W/PHACO Left 10/08/2021    Procedure: CATARACT EXTRACTION PHACO AND INTRAOCULAR LENS PLACEMENT ([place]) LEFT;  Surgeon: Birder Robson, MD;  Location: [facility];  Service: Ophthalmology;  Laterality: Left;  9.98 0:57.7   COLONOSCOPY     GANGLION CYST EXCISION     HAMMER TOE SURGERY     HEMORRHOID BANDING     LE[place]   TONSILLECTOMY     TUBAL LIGATION     Family History  Problem Relation Age of Onset   Heart attack Mother 48   Ovarian cancer Mother    Asthma Father    Brain cancer Sister    Colon cancer Cousin    Breast cancer Neg Hx    Adrenal disorder Neg Hx    Esophageal cancer Neg Hx    Rectal cancer Neg Hx    Stomach cancer Neg Hx    Social History   Socioeconomic History   Marital status: Married    Spouse name: Not on file   Number of children: 1   Years of education: Not on file   Highest education level: Not on file  Occupational History   Occupation: Retired  Tobacco Use   Smoking status: Every Day    Packs/day: 1.00    Years: 52.00    Total pack years: 52.00    Types: Cigarettes   Smokeless tobacco: Never  Vaping Use   Vaping Use: Never used  Substance and Sexual Activity   Alcohol use: Yes    Alcohol/week: 7.0 standard drinks of alcohol    Types: 7 Glasses of wine per week    Comment: SOCIAL   Drug use: No   Sexual activity: Yes  Other Topics Concern   Not on file  Social History Narrative   Moved from [place] to be closer to her daughter, spends winters at [facility] home.      Has HPOA- husband Cletus Gash.   Would desire feeding tube, no prolonged life support.   Not sure about feeding tubes.            Social Determinants of Health   Financial Resource Strain: Low Risk  (01/10/2022)   Overall Financial Resource Strain (CARDIA)    Difficulty of Paying Living Expenses: Not hard at all  Food Insecurity: No Food Insecurity (01/10/2022)   Hunger Vital Sign    Worried About Running Out of Food in the Last Year:  Never true    Ran Out of Food in the Last Year: Never true  Transportation Needs: No Transportation Needs (01/10/2022)   PRAPARE - Hydrologist (Medical): No    Lack of Transportation (Non-Medical): No  Physical Activity: Not on file  Stress: No Stress Concern Present (01/10/2022)   [place] -  Occupational Stress Questionnaire    Feeling of Stress : Not at all  Social Connections: Unknown (01/10/2022)   Social Connection and Isolation Panel [NHANES]    Frequency of Communication with Friends and Family: More than three times a week    Frequency of Social Gatherings with Friends and Family: More than three times a week    Attends Religious Services: Not on Advertising copywriter or Organizations: Yes    Attends Music therapist: More than 4 times per year    Marital Status: Married    Tobacco Counseling Ready to quit: Not Answered Counseling given: Not Answered   Clinical Intake:  Pre-visit preparation completed: Yes        Diabetes: No  How often do you need to have someone help you when you read instructions, pamphlets, or other written materials from your doctor or pharmacy?: 1 - Never  Interpreter Needed?: No      Activities of Daily Living    01/10/2022    9:36 AM 09/24/2021    7:31 AM  In your present state of health, do you have any difficulty performing the following activities:  Hearing? 0 0  Vision? 0 0  Difficulty concentrating or making decisions? 0 0  Walking or climbing stairs? 0 0  Dressing or bathing? 0 0  Doing errands, shopping? 0   Preparing Food and eating ? N   Using the Toilet? N   In the past six months, have you accidently leaked urine? N   Do you have problems with loss of bowel control? N   Managing your Medications? N   Managing your Finances? N   Housekeeping or managing your Housekeeping? N     Patient Care Team: Burnard Hawthorne, FNP as PCP -  General (Family Medicine) Milus Banister, MD (Gastroenterology) Leandrew Koyanagi, MD (Ophthalmology) Angelique Blonder, DO (Dermatology)  Indicate any recent Medical Services you may have received from other than [facility] providers in the past year (date may be approximate).     Assessment:   This is a routine wellness examination for [place].  Virtual Visit via Telephone Note  I connected with  Jenesa Foresta Pekar Willits on 01/10/22 at  9:30 AM EDT by telephone and verified that I am speaking with the correct person using two identifiers.  Location: Patient: home Provider: office Persons participating in the virtual visit: patient/Nurse Health Advisor   I discussed the limitations of performing an evaluation and management service by telehealth. We continued and completed visit with audio only. Some vital signs may be absent or patient reported.   Hearing/Vision screen Hearing Screening - Comments:: Patient is able to hear conversational tones without difficulty.  No issues reported.  Vision Screening - Comments:: Followed by [facility] Wears corrective lenses  Cataracts extracted, bilateral Wet macular; injections every 5-6 weeks  Dietary issues and exercise activities discussed: Current Exercise Habits: Home exercise routine, Time (Minutes): 60, Frequency (Times/Week): 2, Weekly Exercise (Minutes/Week): 120, Intensity: Mild Regular diet Good water intake    Goals Addressed             This Visit's Progress    Maintain Healthy Lifestyle       Stay active. Stay hydrated. Healthy diet.       Depression Screen    01/10/2022    9:57 AM 10/23/2021    8:08 AM 01/08/2021    2:59 PM 10/17/2020   10:42 AM 09/12/2020    8:52 AM  02/21/2020    8:28 AM 01/06/2020   11:44 AM  PHQ 2/9 Scores  PHQ - 2 Score 0 0 0 0 0 0 0    Fall Risk    01/10/2022    9:59 AM 10/23/2021    8:08 AM 01/08/2021    3:01 PM 02/21/2020    8:27 AM 01/17/2020    3:39 PM  [place] in the  past year? 0 0 0 0 0  Number falls in past yr: 0 0  0   Injury with Fall?  0 0 0   Risk for fall due to :  No Fall Risks     Follow up Falls evaluation completed Falls evaluation completed Falls evaluation completed Falls evaluation completed     [place]: Home free of loose throw rugs in walkways, pet beds, electrical cords, etc? Yes  Adequate lighting in your home to reduce risk of falls? Yes   ASSISTIVE DEVICES UTILIZED TO PREVENT FALLS: Life alert? No  Use of a cane, walker or w/c? No   TIMED UP AND GO: Was the test performed? No .   Cognitive Function:    11/27/2016    2:40 PM  MMSE - Mini Mental State Exam  Orientation to time 5  Orientation to Place 5  Registration 3  Attention/ Calculation 0  Recall 3  Language- name 2 objects 0  Language- repeat 1  Language- follow 3 step command 3  Language- read & follow direction 0  Write a sentence 0  Copy design 0  Total score 20        01/10/2022    9:37 AM  6CIT Screen  What Year? 0 points  What month? 0 points  What time? 0 points  Count back from 20 0 points  Months in reverse 0 points  Repeat phrase 0 points  Total Score 0 points    Immunizations Immunization History  Administered Date(s) Administered   Fluad Quad(high Dose 65+) 02/08/2019   Influenza Split 03/14/2011, 02/20/2012   Influenza Whole 03/21/2010   Influenza, High Dose Seasonal PF 02/18/2017   Influenza,inj,Quad PF,6+ Mos 02/14/2013, 02/27/2014, 01/25/2015   Influenza-Unspecified 02/18/2017, 02/11/2020   Moderna Covid-19 Vaccine Bivalent Booster 30yr & up 03/07/2021   Moderna Sars-Covid-2 Vaccination 05/26/2019, 06/23/2019, 03/12/2020, 09/04/2020, 09/12/2021   PNEUMOCOCCAL CONJUGATE-20 10/23/2021   Pneumococcal Conjugate-13 11/21/2013   Pneumococcal Polysaccharide-23 09/03/2010   Td 09/03/2010   Zoster Recombinat (Shingrix) 09/17/2015, 09/15/2017   Zoster, Live 08/22/2009   TDAP status: Due, Education  has been provided regarding the importance of this vaccine. Advised may receive this vaccine at local pharmacy or Health Dept. Aware to provide a copy of the vaccination record if obtained from local pharmacy or Health Dept. Verbalized acceptance and understanding.  Screening Tests Health Maintenance  Topic Date Due   COVID-19 Vaccine (6 - Moderna series) 01/12/2022   INFLUENZA VACCINE  08/17/2022 (Originally 12/17/2021)   TETANUS/TDAP  01/11/2023 (Originally 09/02/2020)   MAMMOGRAM  02/15/2022   COLONOSCOPY (Pts 45-478yrInsurance coverage will need to be confirmed)  10/19/2025   Pneumonia Vaccine 6572Years old  Completed   DEXA SCAN  Completed   Hepatitis C Screening  Completed   Zoster Vaccines- Shingrix  Completed   HPV VACCINES  Aged Out   Health Maintenance Health Maintenance Due  Topic Date Due   COVID-19 Vaccine (6 - Moderna series) 01/12/2022   Lung Cancer Screening: completed 01/07/22.  Vision Screening: Recommended annual ophthalmology  exams for early detection of glaucoma and other disorders of the eye.  Dental Screening: Recommended annual dental exams for proper oral hygiene  Community Resource Referral / Chronic Care Management: CRR required this visit?  No   CCM required this visit?  No      Plan:     I have personally reviewed and noted the following in the patient's chart:   Medical and social history Use of alcohol, tobacco or illicit drugs  Current medications and supplements including opioid prescriptions. Patient is not currently taking opioid prescriptions. Functional ability and status Nutritional status Physical activity Advanced directives List of other physicians Hospitalizations, surgeries, and ER visits in previous 12 months Vitals Screenings to include cognitive, depression, and falls Referrals and appointments  In addition, I have reviewed and discussed with patient certain preventive protocols, quality metrics, and best practice  recommendations. A written personalized care plan for preventive services as well as general preventive health recommendations were provided to patient.     Varney Biles, LPN   09/04/6220

## 2022-01-10 NOTE — Patient Instructions (Addendum)
[name] , Thank you for taking time to come for your Medicare Wellness Visit. I appreciate your ongoing commitment to your health goals. Please review the following plan we discussed and let me know if I can assist you in the future.   These are the goals we discussed:  Goals      Maintain Healthy Lifestyle     Stay active. Stay hydrated. Healthy diet.        This is a list of the screening recommended for you and due dates:  Health Maintenance  Topic Date Due   COVID-19 Vaccine (6 - Moderna series) 01/12/2022   Flu Shot  08/17/2022*   Tetanus Vaccine  01/11/2023*   Mammogram  02/15/2022   Colon Cancer Screening  10/19/2025   Pneumonia Vaccine  Completed   DEXA scan (bone density measurement)  Completed   Hepatitis C Screening: USPSTF Recommendation to screen - Ages 56-79 yo.  Completed   Zoster (Shingles) Vaccine  Completed   HPV Vaccine  Aged Out  *Topic was postponed. The date shown is not the original due date.     Preventive Care 31 Years and Older, Female Preventive care refers to lifestyle choices and visits with your health care provider that can promote health and wellness. What does preventive care include? A yearly physical exam. This is also called an annual well check. Dental exams once or twice a year. Routine eye exams. Ask your health care provider how often you should have your eyes checked. Personal lifestyle choices, including: Daily care of your teeth and gums. Regular physical activity. Eating a healthy diet. Avoiding tobacco and drug use. Limiting alcohol use. Practicing safe sex. Taking low-dose aspirin every day. Taking vitamin and mineral supplements as recommended by your health care provider. What happens during an annual well check? The services and screenings done by your health care provider during your annual well check will depend on your age, overall health, lifestyle risk factors, and family history of disease. Counseling   Your health care provider may ask you questions about your: Alcohol use. Tobacco use. Drug use. Emotional well-being. Home and relationship well-being. Sexual activity. Eating habits. History of falls. Memory and ability to understand (cognition). Work and work Statistician. Reproductive health. Screening  You may have the following tests or measurements: Height, weight, and BMI. Blood pressure. Lipid and cholesterol levels. These may be checked every 5 years, or more frequently if you are over 26 years old. Skin check. Lung cancer screening. You may have this screening every year starting at age 70 if you have a 30-pack-year history of smoking and currently smoke or have quit within the past 15 years. Fecal occult blood test (FOBT) of the stool. You may have this test every year starting at age 68. Flexible sigmoidoscopy or colonoscopy. You may have a sigmoidoscopy every 5 years or a colonoscopy every 10 years starting at age 38. Hepatitis C blood test. Hepatitis B blood test. Sexually transmitted disease (STD) testing. Diabetes screening. This is done by checking your blood sugar (glucose) after you have not eaten for a while (fasting). You may have this done every 1-3 years. Bone density scan. This is done to screen for osteoporosis. You may have this done starting at age 28. Mammogram. This may be done every 1-2 years. Talk to your health care provider about how often you should have regular mammograms. Talk with your health care provider about your test results, treatment options, and if necessary, the need for more  tests. Vaccines  Your health care provider may recommend certain vaccines, such as: Influenza vaccine. This is recommended every year. Tetanus, diphtheria, and acellular pertussis (Tdap, Td) vaccine. You may need a Td booster every 10 years. Zoster vaccine. You may need this after age 81. Pneumococcal 13-valent conjugate (PCV13) vaccine. One dose is recommended  after age 106. Pneumococcal polysaccharide (PPSV23) vaccine. One dose is recommended after age 78. Talk to your health care provider about which screenings and vaccines you need and how often you need them. This information is not intended to replace advice given to you by your health care provider. Make sure you discuss any questions you have with your health care provider. Document Released: 06/01/2015 Document Revised: 01/23/2016 Document Reviewed: 03/06/2015 Elsevier Interactive Patient Education  2017 [employer] Prevention in the Home Falls can cause injuries. They can happen to people of all ages. There are many things you can do to make your home safe and to help prevent falls. What can I do on the outside of my home? Regularly fix the edges of walkways and driveways and fix any cracks. Remove anything that might make you trip as you walk through a door, such as a raised step or threshold. Trim any bushes or trees on the path to your home. Use bright outdoor lighting. Clear any walking paths of anything that might make someone trip, such as rocks or tools. Regularly check to see if handrails are loose or broken. Make sure that both sides of any steps have handrails. Any raised decks and porches should have guardrails on the edges. Have any leaves, snow, or ice cleared regularly. Use sand or salt on walking paths during winter. Clean up any spills in your garage right away. This includes oil or grease spills. What can I do in the bathroom? Use night lights. Install grab bars by the toilet and in the tub and shower. Do not use towel bars as grab bars. Use non-skid mats or decals in the tub or shower. If you need to sit down in the shower, use a plastic, non-slip stool. Keep the floor dry. Clean up any water that spills on the floor as soon as it happens. Remove soap buildup in the tub or shower regularly. Attach bath mats securely with double-sided non-slip rug tape. Do not  have throw rugs and other things on the floor that can make you trip. What can I do in the bedroom? Use night lights. Make sure that you have a light by your bed that is easy to reach. Do not use any sheets or blankets that are too big for your bed. They should not hang down onto the floor. Have a firm chair that has side arms. You can use this for support while you get dressed. Do not have throw rugs and other things on the floor that can make you trip. What can I do in the kitchen? Clean up any spills right away. Avoid walking on wet floors. Keep items that you use a lot in easy-to-reach places. If you need to reach something above you, use a strong step stool that has a grab bar. Keep electrical cords out of the way. Do not use floor polish or wax that makes floors slippery. If you must use wax, use non-skid floor wax. Do not have throw rugs and other things on the floor that can make you trip. What can I do with my stairs? Do not leave any items on the stairs. Make sure  that there are handrails on both sides of the stairs and use them. Fix handrails that are broken or loose. Make sure that handrails are as long as the stairways. Check any carpeting to make sure that it is firmly attached to the stairs. Fix any carpet that is loose or worn. Avoid having throw rugs at the top or bottom of the stairs. If you do have throw rugs, attach them to the floor with carpet tape. Make sure that you have a light switch at the top of the stairs and the bottom of the stairs. If you do not have them, ask someone to add them for you. What else can I do to help prevent falls? Wear shoes that: Do not have high heels. Have rubber bottoms. Are comfortable and fit you well. Are closed at the toe. Do not wear sandals. If you use a stepladder: Make sure that it is fully opened. Do not climb a closed stepladder. Make sure that both sides of the stepladder are locked into place. Ask someone to hold it for  you, if possible. Clearly mark and make sure that you can see: Any grab bars or handrails. First and last steps. Where the edge of each step is. Use tools that help you move around (mobility aids) if they are needed. These include: Canes. Walkers. Scooters. Crutches. Turn on the lights when you go into a dark area. Replace any light bulbs as soon as they burn out. Set up your furniture so you have a clear path. Avoid moving your furniture around. If any of your floors are uneven, fix them. If there are any pets around you, be aware of where they are. Review your medicines with your doctor. Some medicines can make you feel dizzy. This can increase your chance of falling. Ask your doctor what other things that you can do to help prevent falls. This information is not intended to replace advice given to you by your health care provider. Make sure you discuss any questions you have with your health care provider. Document Released: 03/01/2009 Document Revised: 10/11/2015 Document Reviewed: 06/09/2014 Elsevier Interactive Patient Education  2017 [employer].

## 2022-01-13 NOTE — Telephone Encounter (Signed)
See other message from 01/10/2022

## 2022-01-13 NOTE — Telephone Encounter (Signed)
Responded to earlier message yes 70-monthfollow-up this reasonable and indicated.

## 2022-01-14 ENCOUNTER — Encounter: Payer: Self-pay | Admitting: Family

## 2022-01-15 NOTE — Telephone Encounter (Signed)
Copy of CT results mailed to patient's home with request to fax [place] report of follow up CT chest.  Fax # included in request.

## 2022-02-17 ENCOUNTER — Ambulatory Visit: Payer: Medicare Other

## 2022-02-18 ENCOUNTER — Ambulatory Visit
Admission: RE | Admit: 2022-02-18 | Discharge: 2022-02-18 | Disposition: A | Discharge status: Home / Self Care | Payer: Medicare Other | Source: Ambulatory Visit | Attending: Family | Admitting: Family

## 2022-02-18 DIAGNOSIS — Z1231 Encounter for screening mammogram for malignant neoplasm of breast: Secondary | ICD-10-CM

## 2022-09-03 ENCOUNTER — Telehealth: Payer: Self-pay | Admitting: Family

## 2022-09-03 DIAGNOSIS — M810 Age-related osteoporosis without current pathological fracture: Secondary | ICD-10-CM

## 2022-09-03 DIAGNOSIS — E538 Deficiency of other specified B group vitamins: Secondary | ICD-10-CM

## 2022-09-03 DIAGNOSIS — E785 Hyperlipidemia, unspecified: Secondary | ICD-10-CM

## 2022-09-03 DIAGNOSIS — K589 Irritable bowel syndrome without diarrhea: Secondary | ICD-10-CM

## 2022-09-03 DIAGNOSIS — R739 Hyperglycemia, unspecified: Secondary | ICD-10-CM

## 2022-09-03 NOTE — Telephone Encounter (Signed)
Spoke to pt and scheduled a CPE for 10/20/22 pt does not need refills at this time but would like to get labs done before hand.

## 2022-09-03 NOTE — Telephone Encounter (Signed)
Pt called in staying that if provider can put a lab orders in so she can renew her meds. Any questions, she's available -[phone].

## 2022-09-03 NOTE — Telephone Encounter (Signed)
Spoke to ot she stated that she will wait until day of appt to get labwork done

## 2022-09-03 NOTE — Telephone Encounter (Signed)
Labs ordered and also sent pt message via mychart in regards to insurance covering or not

## 2022-09-17 ENCOUNTER — Ambulatory Visit
Admission: RE | Admit: 2022-09-17 | Discharge: 2022-09-17 | Disposition: A | Discharge status: Home / Self Care | Payer: Medicare Other | Source: Ambulatory Visit | Attending: Family | Admitting: Family

## 2022-09-17 DIAGNOSIS — M810 Age-related osteoporosis without current pathological fracture: Secondary | ICD-10-CM

## 2022-10-20 ENCOUNTER — Encounter: Payer: Self-pay | Admitting: Family

## 2022-10-20 ENCOUNTER — Ambulatory Visit (INDEPENDENT_AMBULATORY_CARE_PROVIDER_SITE_OTHER): Payer: Medicare Other | Admitting: Family

## 2022-10-20 VITALS — BP 128/76 | HR 71 | Temp 98.0°F | Ht 61.0 in | Wt 151.4 lb

## 2022-10-20 DIAGNOSIS — Z1231 Encounter for screening mammogram for malignant neoplasm of breast: Secondary | ICD-10-CM

## 2022-10-20 DIAGNOSIS — M810 Age-related osteoporosis without current pathological fracture: Secondary | ICD-10-CM | POA: Diagnosis not present

## 2022-10-20 DIAGNOSIS — Q891 Congenital malformations of adrenal gland: Secondary | ICD-10-CM

## 2022-10-20 DIAGNOSIS — E538 Deficiency of other specified B group vitamins: Secondary | ICD-10-CM | POA: Diagnosis not present

## 2022-10-20 DIAGNOSIS — I700 Atherosclerosis of aorta: Secondary | ICD-10-CM

## 2022-10-20 DIAGNOSIS — E785 Hyperlipidemia, unspecified: Secondary | ICD-10-CM

## 2022-10-20 DIAGNOSIS — R739 Hyperglycemia, unspecified: Secondary | ICD-10-CM | POA: Diagnosis not present

## 2022-10-20 DIAGNOSIS — Z0000 Encounter for general adult medical examination without abnormal findings: Secondary | ICD-10-CM

## 2022-10-20 DIAGNOSIS — R911 Solitary pulmonary nodule: Secondary | ICD-10-CM | POA: Insufficient documentation

## 2022-10-20 LAB — COMPREHENSIVE METABOLIC PANEL
ALT: 12 U/L (ref 0–35)
AST: 19 U/L (ref 0–37)
Albumin: 4.1 g/dL (ref 3.5–5.2)
Alkaline Phosphatase: 67 U/L (ref 39–117)
BUN: 9 mg/dL (ref 6–23)
CO2: 30 mEq/L (ref 19–32)
Calcium: 9.2 mg/dL (ref 8.4–10.5)
Chloride: 102 mEq/L (ref 96–112)
Creatinine, Ser: 0.64 mg/dL (ref 0.40–1.20)
GFR: 85.24 mL/min (ref >60.00)
Glucose, Bld: 99 mg/dL (ref 70–99)
Potassium: 5 mEq/L (ref 3.5–5.1)
Sodium: 139 mEq/L (ref 135–145)
Total Bilirubin: 0.5 mg/dL (ref 0.2–1.2)
Total Protein: 6.9 g/dL (ref 6.0–8.3)

## 2022-10-20 LAB — CBC WITH DIFFERENTIAL/PLATELET
Basophils Absolute: 0 10*3/uL (ref 0.0–0.1)
Basophils Relative: 0.5 % (ref 0.0–3.0)
Eosinophils Absolute: 0.3 10*3/uL (ref 0.0–0.7)
Eosinophils Relative: 3.3 % (ref 0.0–5.0)
HCT: 47.7 % — ABNORMAL HIGH (ref 36.0–46.0)
Hemoglobin: 15.8 g/dL — ABNORMAL HIGH (ref 12.0–15.0)
Lymphocytes Relative: 24.2 % (ref 12.0–46.0)
Lymphs Abs: 2.3 10*3/uL (ref 0.7–4.0)
MCHC: 33 g/dL (ref 30.0–36.0)
MCV: 91.6 fl (ref 78.0–100.0)
Monocytes Absolute: 0.7 10*3/uL (ref 0.1–1.0)
Monocytes Relative: 7.7 % (ref 3.0–12.0)
Neutro Abs: 6 10*3/uL (ref 1.4–7.7)
Neutrophils Relative %: 64.3 % (ref 43.0–77.0)
Platelets: 286 10*3/uL (ref 150.0–400.0)
RBC: 5.21 Mil/uL — ABNORMAL HIGH (ref 3.87–5.11)
RDW: 13.6 % (ref 11.5–15.5)
WBC: 9.3 10*3/uL (ref 4.0–10.5)

## 2022-10-20 LAB — LIPID PANEL
Cholesterol: 185 mg/dL (ref 0–200)
HDL: 82.3 mg/dL (ref >39.00)
LDL Cholesterol: 80 mg/dL (ref 0–99)
NonHDL: 102.61
Total CHOL/HDL Ratio: 2
Triglycerides: 112 mg/dL (ref 0.0–149.0)
VLDL: 22.4 mg/dL (ref 0.0–40.0)

## 2022-10-20 LAB — B12 AND FOLATE PANEL
Folate: 17.5 ng/mL (ref >5.9)
Vitamin B-12: 249 pg/mL (ref 211–911)

## 2022-10-20 LAB — VITAMIN D 25 HYDROXY (VIT D DEFICIENCY, FRACTURES): VITD: 46.1 ng/mL (ref 30.00–100.00)

## 2022-10-20 LAB — TSH: TSH: 1.84 u[IU]/mL (ref 0.35–5.50)

## 2022-10-20 NOTE — Progress Notes (Signed)
Assessment & Plan:  Encounter for screening mammogram for malignant neoplasm of breast -     3D Screening Mammogram, Left and Right; Future  B12 deficiency -     B12 and Folate Panel  Osteoporosis, unspecified osteoporosis type, unspecified pathological fracture presence -     VITAMIN D 25 Hydroxy (Vit-D Deficiency, Fractures) -     TSH  Hyperglycemia -     Comprehensive metabolic panel -     CBC with Differential/Platelet  Hyperlipidemia, unspecified hyperlipidemia type -     Lipid panel  Adrenal gland anomaly Assessment & Plan: History of bilateral adrenal adenoma.  CT chest 01/07/2022 stable bilateral adrenal gland adenomas.  Fortunately she is asymptomatic.  She is not currently following with endocrinology. Dr Everardo All ordered urine cortisol ( normal), plasma cortisol ( normal)  08/2020. We jointly agreed to continue surveillance for CT chest.  Patient plans to schedule CT chest August 2024 through [facility] pulmonology.   Routine physical examination Assessment & Plan: Patient declines clinical breast exam.  I ordered mammogram which she will schedule.  In the absence of complaints and as she is no longer screening for cervical cancer, deferred pelvic exam.  Encouraged exercise and Tdap at local pharmacy.    Nodule of left lung Assessment & Plan: CT chest 01/07/22 with new new nodule, follow-up in 3 months.  Stable bilateral adrenal gland adenomas  Reviewed consult with pulmonology 07/29/2022 [facility], Dr Inda Merlin   repeat low-dose CT 12/7/ 23 probably benign nodule left upper lobe.  Recommended CT chest in 6 to 12 months ( due 11/2022) , then ct chest 18-24 months.   She plans to schedule CT chest without pulmonology.will follow.    Aortic atherosclerosis (HCC) Assessment & Plan: Symptomatically stable. Continue Pravachol 20 mg daily. Pending lipid panel.       Return precautions given.   Risks, benefits, and alternatives of the  medications and treatment plan prescribed today were discussed, and patient expressed understanding.   Education regarding symptom management and diagnosis given to patient on AVS either electronically or printed.  Return for Complete Physical Exam.  Rennie Plowman, FNP  Subjective:    Patient ID: Abigail Willis, female    DOB: 1946/05/06, 77 y.o.   MRN: [id number]  CC: Abigail Willis is a 77 y.o. female who presents today for physical exam.    HPI:  Feels well today no new complaints.  She lives in [place] 6 months out of the year.  She has a primary care physician there.  Chair yoga twice per week.  She plans to start water aerobics  No cp, sob     History of bilateral adrenal adenoma.  No unusual weight gain or weight loss.  No weakness She is not currently following with endocrinology. 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 She has follow up planned 02/2023.   Atherosclerosis.  previously seen cardiology 02/12/2021, Marisue Ivan, 10/07/21   Colorectal Cancer Screening: UTD , Dr. Christella Hartigan.  Repeat in 5 years.Due 10/2025 Breast Cancer Screening: Mammogram UTD Cervical Cancer Screening: no longer screening for cervical cancer.  Bone Health screening/DEXA for 65+: UTD 09/17/22  Lung Cancer Screening: CT chest 01/07/22 with new new nodule, follow-up in 3 months.  Stable bilateral adrenal gland adenomas  Consult pulmonology 07/29/2022 [facility], Dr Inda Merlin   repeat low-dose CT 12/7/ 23 probably benign nodule left upper lobe.  Recommended CT chest in 6 to 12 months ( due 11/2022) , then ct chest 18-24 months          Tetanus - due        Pneumococcal - Complete Exercise: Gets regular exercise.   Alcohol use: Occasional Smoking/tobacco use: smoker.  Health Maintenance  Topic Date Due   DTaP/Tdap/Td vaccine (2 - Tdap) 09/02/2020   Flu Shot  12/18/2022   Screening for Lung Cancer  01/08/2023   Medicare Annual Wellness Visit  01/11/2023   Mammogram  02/19/2023   Colon Cancer Screening  10/19/2025   Pneumonia Vaccine  Completed   DEXA scan (bone density measurement)  Completed   COVID-19 Vaccine  Completed   Hepatitis C Screening  Completed   Zoster (Shingles) Vaccine  Completed   HPV Vaccine  Aged Out    ALLERGIES: Avelox [moxifloxacin hcl in nacl] and Penicillins  Current Outpatient Medications on File Prior to Visit  Medication Sig Dispense Refill   acetaminophen (TYLENOL) 500 MG tablet Take 500 mg by mouth as needed.     aspirin 81 MG tablet Take 81 mg by mouth daily.     cetirizine (ZYRTEC) 10 MG  tablet Take 10 mg by mouth daily.     hyoscyamine (LEVBID) 0.375 MG 12 hr tablet Take 1 tablet (0.375 mg total) by mouth every 12 (twelve) hours as needed. 60 tablet 6   ibuprofen (ADVIL,MOTRIN) 200 MG tablet Take 200 mg by mouth as needed.     loperamide (IMODIUM A-D) 2 MG tablet Take 1 tablet (2 mg total) by mouth in the morning. 30 tablet 0   pravastatin (PRAVACHOL) 20 MG tablet TAKE 1 TABLET(20 MG) BY MOUTH AT BEDTIME 90 tablet 3   pseudoephedrine-guaifenesin (MUCINEX D) 60-600 MG per tablet Take 1 tablet by mouth every 12 (twelve) hours as needed.     sodium chloride (OCEAN) 0.65 % SOLN nasal spray Place 1 spray into both nostrils as needed for congestion.     No current facility-administered medications on file prior to visit.    Review of Systems  Constitutional:  Negative for chills, fever and unexpected weight change.  HENT:  Negative for congestion.   Respiratory:  Negative for cough.   Cardiovascular:  Negative for chest pain, palpitations and leg swelling.  Gastrointestinal:  Negative for nausea and vomiting.  Musculoskeletal:  Negative for arthralgias and myalgias.  Skin:  Negative for rash.  Neurological:  Negative for headaches.  Hematological:  Negative for adenopathy.  Psychiatric/Behavioral:  Negative for confusion.       Objective:    BP 128/76   Pulse 71   Temp 98 F (36.7 C) (Oral)   Ht 5\' 1"  (1.549 m)   Wt 151 lb 6.4 oz (68.7 kg)   SpO2 97%   BMI 28.61 kg/m   BP Readings from Last 3 Encounters:  10/20/22 128/76  01/08/22 130/80  10/23/21 124/70   Wt Readings from Last 3 Encounters:  10/20/22 151 lb 6.4 oz (68.7 kg)  01/10/22 151 lb (68.5 kg)  01/08/22 151 lb 6.4 oz (68.7 kg)    Physical Exam Vitals reviewed.  Constitutional:      Appearance: She is well-developed.  Eyes:     Conjunctiva/sclera: Conjunctivae normal.  Neck:     Thyroid: No thyroid mass or thyromegaly.      Comments: Scar noted Cardiovascular:     Rate and Rhythm: Normal  rate and regular rhythm.     Pulses: Normal pulses.     Heart sounds: Normal heart sounds.  Pulmonary:     Effort: Pulmonary effort is normal.     Breath sounds: Normal breath sounds. No wheezing, rhonchi or rales.  Lymphadenopathy:     Head:     Right side of head: No submental, submandibular, tonsillar, preauricular, posterior auricular  or occipital adenopathy.     Left side of head: No submental, submandibular, tonsillar, preauricular, posterior auricular or occipital adenopathy.     Cervical: No cervical adenopathy.  Skin:    General: Skin is warm and dry.  Neurological:     Mental Status: She is alert.  Psychiatric:        Speech: Speech normal.        Behavior: Behavior normal.        Thought Content: Thought content normal.

## 2022-10-20 NOTE — Patient Instructions (Addendum)
Please ensure that you do call to schedule mammogram and also [place] pulmonology to schedule CT chest in August of this year.  Please let me know if any issues in scheduling.  We will continue to monitor lung nodule and adrenal adenomas.  Nice to see you.   Health Maintenance for Postmenopausal Women Menopause is a normal process in which your ability to get pregnant comes to an end. This process happens slowly over many months or years, usually between the ages of 40 and 24. Menopause is complete when you have missed your menstrual period for 12 months. It is important to talk with your health care provider about some of the most common conditions that affect women after menopause (postmenopausal women). These include heart disease, cancer, and bone loss (osteoporosis). Adopting a healthy lifestyle and getting preventive care can help to promote your health and wellness. The actions you take can also lower your chances of developing some of these common conditions. What are the signs and symptoms of menopause? During menopause, you may have the following symptoms: Hot flashes. These can be moderate or severe. Night sweats. Decrease in sex drive. Mood swings. Headaches. Tiredness (fatigue). Irritability. Memory problems. Problems falling asleep or staying asleep. Talk with your health care provider about treatment options for your symptoms. Do I need hormone replacement therapy? Hormone replacement therapy is effective in treating symptoms that are caused by menopause, such as hot flashes and night sweats. Hormone replacement carries certain risks, especially as you become older. If you are thinking about using estrogen or estrogen with progestin, discuss the benefits and risks with your health care provider. How can I reduce my risk for heart disease and stroke? The risk of heart disease, heart attack, and stroke increases as you age. One of the causes may be a change in the body's  hormones during menopause. This can affect how your body uses dietary fats, triglycerides, and cholesterol. Heart attack and stroke are medical emergencies. There are many things that you can do to help prevent heart disease and stroke. Watch your blood pressure High blood pressure causes heart disease and increases the risk of stroke. This is more likely to develop in people who have high blood pressure readings or are overweight. Have your blood pressure checked: Every 3-5 years if you are 54-70 years of age. Every year if you are 47 years old or older. Eat a healthy diet  Eat a diet that includes plenty of vegetables, fruits, low-fat dairy products, and lean protein. Do not eat a lot of foods that are high in solid fats, added sugars, or sodium. Get regular exercise Get regular exercise. This is one of the most important things you can do for your health. Most adults should: Try to exercise for at least 150 minutes each week. The exercise should increase your heart rate and make you sweat (moderate-intensity exercise). Try to do strengthening exercises at least twice each week. Do these in addition to the moderate-intensity exercise. Spend less time sitting. Even light physical activity can be beneficial. Other tips Work with your health care provider to achieve or maintain a healthy weight. Do not use any products that contain nicotine or tobacco. These products include cigarettes, chewing tobacco, and vaping devices, such as e-cigarettes. If you need help quitting, ask your health care provider. Know your numbers. Ask your health care provider to check your cholesterol and your blood sugar (glucose). Continue to have your blood tested as directed by your health care provider. Do  I need screening for cancer? Depending on your health history and family history, you may need to have cancer screenings at different stages of your life. This may include screening for: Breast cancer. Cervical  cancer. Lung cancer. Colorectal cancer. What is my risk for osteoporosis? After menopause, you may be at increased risk for osteoporosis. Osteoporosis is a condition in which bone destruction happens more quickly than new bone creation. To help prevent osteoporosis or the bone fractures that can happen because of osteoporosis, you may take the following actions: If you are 91-12 years old, get at least 1,000 mg of calcium and at least 600 international units (IU) of vitamin D per day. If you are older than age 9 but younger than age 82, get at least 1,200 mg of calcium and at least 600 international units (IU) of vitamin D per day. If you are older than age 36, get at least 1,200 mg of calcium and at least 800 international units (IU) of vitamin D per day. Smoking and drinking excessive alcohol increase the risk of osteoporosis. Eat foods that are rich in calcium and vitamin D, and do weight-bearing exercises several times each week as directed by your health care provider. How does menopause affect my mental health? Depression may occur at any age, but it is more common as you become older. Common symptoms of depression include: Feeling depressed. Changes in sleep patterns. Changes in appetite or eating patterns. Feeling an overall lack of motivation or enjoyment of activities that you previously enjoyed. Frequent crying spells. Talk with your health care provider if you think that you are experiencing any of these symptoms. General instructions See your health care provider for regular wellness exams and vaccines. This may include: Scheduling regular health, dental, and eye exams. Getting and maintaining your vaccines. These include: Influenza vaccine. Get this vaccine each year before the flu season begins. Pneumonia vaccine. Shingles vaccine. Tetanus, diphtheria, and pertussis (Tdap) booster vaccine. Your health care provider may also recommend other immunizations. Tell your health  care provider if you have ever been abused or do not feel safe at home. Summary Menopause is a normal process in which your ability to get pregnant comes to an end. This condition causes hot flashes, night sweats, decreased interest in sex, mood swings, headaches, or lack of sleep. Treatment for this condition may include hormone replacement therapy. Take actions to keep yourself healthy, including exercising regularly, eating a healthy diet, watching your weight, and checking your blood pressure and blood sugar levels. Get screened for cancer and depression. Make sure that you are up to date with all your vaccines. This information is not intended to replace advice given to you by your health care provider. Make sure you discuss any questions you have with your health care provider. Document Revised: 09/24/2020 Document Reviewed: 09/24/2020 Elsevier Patient Education  2024 [employer].

## 2022-10-20 NOTE — Assessment & Plan Note (Signed)
CT chest 01/07/22 with new new nodule, follow-up in 3 months.  Stable bilateral adrenal gland adenomas  Reviewed consult with pulmonology 07/29/2022 [facility], Dr Inda Merlin   repeat low-dose CT 12/7/ 23 probably benign nodule left upper lobe.  Recommended CT chest in 6 to 12 months ( due 11/2022) , then ct chest 18-24 months.   She plans to schedule CT chest without pulmonology.will follow.

## 2022-10-20 NOTE — Assessment & Plan Note (Signed)
Symptomatically stable. Continue Pravachol 20 mg daily. Pending lipid panel.

## 2022-10-20 NOTE — Assessment & Plan Note (Addendum)
History of bilateral adrenal adenoma.  CT chest 01/07/2022 stable bilateral adrenal gland adenomas.  Fortunately she is asymptomatic.  She is not currently following with endocrinology. Dr Everardo All ordered urine cortisol ( normal), plasma cortisol ( normal)  08/2020. We jointly agreed to continue surveillance for CT chest.  Patient plans to schedule CT chest August 2024 through [facility] pulmonology.

## 2022-10-20 NOTE — Assessment & Plan Note (Signed)
Patient declines clinical breast exam.  I ordered mammogram which she will schedule.  In the absence of complaints and as she is no longer screening for cervical cancer, deferred pelvic exam.  Encouraged exercise and Tdap at local pharmacy.

## 2022-10-23 ENCOUNTER — Encounter: Payer: Self-pay | Admitting: Family

## 2022-10-27 ENCOUNTER — Other Ambulatory Visit: Payer: Self-pay

## 2022-10-27 MED ORDER — PRAVASTATIN SODIUM 20 MG PO TABS: ORAL_TABLET | ORAL | 3 refills | Status: DC

## 2022-10-27 NOTE — Telephone Encounter (Signed)
Done

## 2022-12-09 ENCOUNTER — Telehealth: Payer: Self-pay | Admitting: Acute Care

## 2022-12-09 DIAGNOSIS — Z122 Encounter for screening for malignant neoplasm of respiratory organs: Secondary | ICD-10-CM

## 2022-12-09 DIAGNOSIS — Z87891 Personal history of nicotine dependence: Secondary | ICD-10-CM

## 2022-12-09 DIAGNOSIS — F17210 Nicotine dependence, cigarettes, uncomplicated: Secondary | ICD-10-CM

## 2022-12-09 NOTE — Telephone Encounter (Signed)
Patient is calling because she said she usually gets a call about her scan that she has done.  She said she has not heard anything yet from the office.  Please call patient to discuss further.  CB# [phone]

## 2022-12-10 NOTE — Telephone Encounter (Signed)
Called and spoke to patient. Scheduled patient for 6 month repeat scan for 12/15/2022 at [facility]. Pt verbalized understanding and denied any further questions or concerns at this time.

## 2022-12-15 ENCOUNTER — Ambulatory Visit
Admission: RE | Admit: 2022-12-15 | Discharge: 2022-12-15 | Disposition: A | Discharge status: Home / Self Care | Payer: Medicare Other | Source: Ambulatory Visit | Attending: Acute Care | Admitting: Acute Care

## 2022-12-15 DIAGNOSIS — Z122 Encounter for screening for malignant neoplasm of respiratory organs: Secondary | ICD-10-CM | POA: Diagnosis present

## 2022-12-15 DIAGNOSIS — F17210 Nicotine dependence, cigarettes, uncomplicated: Secondary | ICD-10-CM | POA: Diagnosis present

## 2022-12-15 DIAGNOSIS — Z87891 Personal history of nicotine dependence: Secondary | ICD-10-CM | POA: Diagnosis present

## 2022-12-17 ENCOUNTER — Encounter (INDEPENDENT_AMBULATORY_CARE_PROVIDER_SITE_OTHER): Payer: Self-pay

## 2022-12-22 ENCOUNTER — Telehealth: Payer: Self-pay | Admitting: Acute Care

## 2022-12-22 NOTE — Telephone Encounter (Signed)
Call Report  

## 2022-12-22 NOTE — Telephone Encounter (Signed)
Results received to lung screening dashboard.   IMPRESSION: 1. Lung-RADS 4B, suspicious. Additional imaging evaluation or consultation with Pulmonology or Thoracic Surgery recommended. In the interval since the 2023 study, a previously existing ground-glass opacity in the left upper lobe has developed a lobular soft tissue component measuring 8 mm. Imaging features are concerning for adenocarcinoma. 2. Stable bilateral adrenal adenomas. 3. Aortic Atherosclerosis (ICD10-I70.0) and Emphysema (ICD10-J43.9).

## 2022-12-25 ENCOUNTER — Other Ambulatory Visit: Payer: Self-pay | Admitting: Acute Care

## 2022-12-25 ENCOUNTER — Telehealth: Payer: Self-pay | Admitting: Acute Care

## 2022-12-25 DIAGNOSIS — R911 Solitary pulmonary nodule: Secondary | ICD-10-CM

## 2022-12-25 NOTE — Telephone Encounter (Signed)
I have called the patient with results of her low-dose screening CT.  I explained that her scan was read as a lung RADS 4B, suspicious. While the irregular 6.4 mm nodule of concern in the left upper lobe on the previous study has resolved in the interval another nodule in the peripheral left upper lobe which has been a groundglass opacity over multi ple studies dating back to 2021 has now developed a lobular soft tissue component that measures 8.2 mm.  The groundglass area is 15.6 mm in total.. I explained to the patient that next steps should be to do a PET scan to better evaluate this. This patient lives 50% of the year in [place] and the other 50% of the year in [place] She will be moving back to [place] October 8 or 9 She will be in [place] from September 6 through the 20th Plan will be to get patient scheduled for PET scan as soon as possible with follow-up with either Dr. Jayme Cloud or Dr. Aundria Rud within a week of the PET to determine next steps in plan of care. I have ordered the PET scan.  D, D, and E, please fax results to PCP and let them know the plan is for a PET scan to better evaluate the nodule. Once PET scan is scheduled we will need to get the patient onto either Dr. Aundria Rud or Dr. Jayme Cloud  schedules to be seen in the office to review the results. I will add Delray Alt and Trula Ore to this note as well as Dr. Jayme Cloud and Dr. Aundria Rud Thank so much everyone

## 2022-12-25 NOTE — Telephone Encounter (Signed)
Will schedule appt once PET has been scheduled.

## 2022-12-25 NOTE — Telephone Encounter (Signed)
See telephone note from 12/25/22

## 2022-12-25 NOTE — Telephone Encounter (Signed)
CT results/ follow up plans faxed to PCP.

## 2022-12-26 ENCOUNTER — Telehealth: Payer: Self-pay | Admitting: Pulmonary Disease

## 2022-12-26 NOTE — Telephone Encounter (Signed)
Spoke to patient and scheduled appt 01/07/2023 at 4:00. Directions provided.  Nothing further needed.

## 2022-12-26 NOTE — Telephone Encounter (Signed)
Spoke to patient.  She stated that PET was originally scheduled for 01/05/2023, however scheduling called and moved it to 12/29/2022, therefore she contacted our office and moved 08/21 appt with Dr. Jayme Cloud to 08/19. Scheduling then called back and rescheduled PET back to 08/19 due to PA not being completed. Patient would like to reschedule 01/05/2023 appt with Dr. Jayme Cloud. Appt rescheduled to 01/07/2023 at 3:00 with Dr. Jayme Cloud.  Nothing further needed.

## 2022-12-26 NOTE — Telephone Encounter (Signed)
PET scan is sch 08/19 @ 2pm PT is going on a 2 week vacation and wants to know If Dr. Jayme Cloud can please squeeze her in on 08/21

## 2022-12-29 ENCOUNTER — Other Ambulatory Visit: Payer: Medicare Other

## 2022-12-31 ENCOUNTER — Encounter: Payer: Self-pay | Admitting: Family

## 2022-12-31 NOTE — Telephone Encounter (Signed)
Noted PET scan 01/05/23 Sent pt mychart note

## 2023-01-05 ENCOUNTER — Institutional Professional Consult (permissible substitution): Payer: Medicare Other | Admitting: Pulmonary Disease

## 2023-01-05 ENCOUNTER — Ambulatory Visit
Admission: RE | Admit: 2023-01-05 | Discharge: 2023-01-05 | Disposition: A | Discharge status: Home / Self Care | Payer: Medicare Other | Source: Ambulatory Visit | Attending: Acute Care | Admitting: Acute Care

## 2023-01-05 ENCOUNTER — Ambulatory Visit: Payer: Medicare Other

## 2023-01-05 DIAGNOSIS — R911 Solitary pulmonary nodule: Secondary | ICD-10-CM | POA: Insufficient documentation

## 2023-01-05 LAB — GLUCOSE, CAPILLARY: Glucose-Capillary: 108 mg/dL — ABNORMAL HIGH (ref 70–99)

## 2023-01-05 MED ORDER — FLUDEOXYGLUCOSE F - 18 (FDG) INJECTION
7.9400 | Freq: Once | INTRAVENOUS | Status: AC
  Administered 2023-01-05: 7.94 via INTRAVENOUS

## 2023-01-07 ENCOUNTER — Institutional Professional Consult (permissible substitution): Payer: Medicare Other | Admitting: Pulmonary Disease

## 2023-01-07 ENCOUNTER — Encounter: Payer: Self-pay | Admitting: Pulmonary Disease

## 2023-01-07 ENCOUNTER — Ambulatory Visit: Payer: Medicare Other | Admitting: Pulmonary Disease

## 2023-01-07 VITALS — BP 124/82 | HR 87 | Temp 98.2°F | Ht 61.0 in | Wt 154.2 lb

## 2023-01-07 DIAGNOSIS — R911 Solitary pulmonary nodule: Secondary | ICD-10-CM

## 2023-01-07 DIAGNOSIS — J449 Chronic obstructive pulmonary disease, unspecified: Secondary | ICD-10-CM | POA: Diagnosis not present

## 2023-01-07 DIAGNOSIS — F17210 Nicotine dependence, cigarettes, uncomplicated: Secondary | ICD-10-CM | POA: Diagnosis not present

## 2023-01-07 MED ORDER — SPIRIVA RESPIMAT 2.5 MCG/ACT IN AERS: 2.0000 | INHALATION_SPRAY | Freq: Every day | RESPIRATORY_TRACT | 0 refills | Status: DC

## 2023-01-07 NOTE — Progress Notes (Signed)
Subjective:    Patient ID: Abigail Willis, female    DOB: Apr 10, 1946, 77 y.o.   MRN: [id number]  Patient Care Team: Allegra Grana, FNP as PCP - General (Family Medicine) Rachael Fee, MD (Gastroenterology) Lockie Mola, MD (Ophthalmology) Sharyne Richters, DO (Dermatology)  Chief Complaint  Patient presents with   Consult    Lung Nodule. CT and PET scan result. No SOB. Wheezing. Cough with sputum.    HPI [name] is a 77 year old current smoker (22 PY) who presents for evaluation of a lung nodule noted for lung cancer screening CT that has demonstrated growth.  She is kindly referred by Rennie Plowman, FNP.  [name] spends half of the year in [place] and the other half of the year in [place].  She has follow-up with pulmonary medicine in [place] as well.  The nodule in question has been followed both here and in [place].  The patient has not had any weight loss or anorexia.  She has had cough productive of whitish sputum mostly in the mornings.  She does noted shortness of breath on exertion (inclines/stairs) but none during activities of daily living.  She also notes wheezing on occasion.  She has been tried on [employer] for management of mild COPD however did not tolerate the medication due to exacerbating her irritable bowel syndrome.  The patient's lung cancer screening CTs have been read lung RADS 2 until this scan performed in July.  At this point a previously noted groundglass opacity noted as far back as 2021 has now been noted to have developed a lobular soft tissue component measuring 8 mm this is new since her prior film of 07 January 2022.  The patient tells me that she had a CT chest performed in December 2023 that showed that this nodule in the left upper lobe was 6 mm or less.  This film was performed through [facility] in [place].  I do not have access to those images.  The patient has not had any fevers, chills or sweats.   No orthopnea or paroxysmal nocturnal dyspnea.  No lower extremity edema nor calf tenderness.  She had pulmonary function testing performed on 02 June 2022 at [facility] that showed FEV1 to be 80% predicted, FVC 95% predicted, FEV1/FVC 64%.  There was no significant bronchodilator response.  Diffusion capacity was mildly reduced.  This is consistent with mild to moderate obstructive lung disease.  She is not on maintenance inhalers due to inability to tolerate Trelegy.  She follows with Dr. Nyra Jabs, Pulmonologist in [place], [place].  She is retired with no significant occupational exposure in the past.  No military history.  No pets and no exotic hobbies.  Review of Systems A 10 point review of systems was performed and it is as noted above otherwise negative.   Past Medical History:  Diagnosis Date   Allergy    SINUS   Cancer (HCC)    skin   Cataract    bilateral   Colon polyps 11/14/2013   Tubular adenoma, hyperplastic polyps   COPD (chronic obstructive pulmonary disease) (HCC)    Diverticulosis 11/15/2010   Moderate   External hemorrhoids    Hyperlipidemia    IBS (irritable bowel syndrome)    Osteoporosis    Squamous cell carcinoma in situ    Vertigo     Past Surgical History:  Procedure Laterality Date   BREAST BIOPSY     CATARACT EXTRACTION W/PHACO  Right 09/24/2021   Procedure: CATARACT EXTRACTION PHACO AND INTRAOCULAR LENS PLACEMENT (IOC) RIGHT;  Surgeon: Galen Manila, MD;  Location: [facility];  Service: Ophthalmology;  Laterality: Right;  15.97 01:15.5   CATARACT EXTRACTION W/PHACO Left 10/08/2021   Procedure: CATARACT EXTRACTION PHACO AND INTRAOCULAR LENS PLACEMENT (IOC) LEFT;  Surgeon: Galen Manila, MD;  Location: [facility];  Service: Ophthalmology;  Laterality: Left;  9.98 0:57.7   COLONOSCOPY     GANGLION CYST EXCISION     HAMMER TOE SURGERY     HEMORRHOID BANDING     LEEP      twice, in [place]   MOHS  SURGERY  2019   PAROTIDECTOMY Left    08/27/22 [facility], Dr Louie Boston left partial parotidectomy  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   TONSILLECTOMY     TUBAL LIGATION      Patient Active Problem List   Diagnosis Date Noted   Nodule of left lung 10/20/2022   Acute otitis externa of right ear 03/25/2021   B12 deficiency 10/08/2020   Adrenal hyperplasia (HCC) 09/13/2020   Screening for AAA (abdominal aortic aneurysm) 02/21/2020   Routine physical examination 02/21/2020   Encounter for long-term (current) aspirin use 02/21/2020   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   Osteoporosis 02/21/2020   Numbness and tingling in right hand 11/29/2019   Elevated hemoglobin (HCC) 11/29/2019   Pelvic floor dysfunction in female 02/23/2019   Coronary artery calcification 12/31/2018   Coronary atherosclerosis 12/29/2018   Aortic atherosclerosis (HCC) 12/28/2018   Emphysema of lung (HCC) 12/28/2018   Chest pain 12/28/2018   Encounter for examination following treatment at hospital 12/28/2018   Hyperlipidemia LDL goal <70    Tobacco abuse    Fatigue 11/22/2018   Internal and external bleeding hemorrhoids 10/03/2016   Perianal dermatitis 10/03/2016   IBS (irritable bowel syndrome) 11/27/2015   Hyperglycemia 11/21/2013   Bilateral leg edema 02/14/2013   HLD (hyperlipidemia) 03/06/2010   Allergic rhinitis 03/06/2010    Family History  Problem Relation Age of Onset   Heart attack Mother 91   Ovarian cancer Mother    Asthma Father    Brain cancer Sister    Colon cancer Cousin    Breast cancer Neg Hx    Adrenal disorder Neg Hx    Esophageal cancer Neg Hx    Rectal cancer Neg Hx    Stomach cancer Neg Hx     Social History   Tobacco Use   Smoking status: Every Day    Current packs/day: 1.00    Average packs/day: 1 pack/day for 52.0 years (52.0 ttl pk-yrs)    Types: Cigarettes   Smokeless tobacco: Never   Tobacco comments:    0.75 PPD for 55 years.  01/07/2023 khj  Substance Use Topics   Alcohol use: Yes    Alcohol/week: 7.0 standard drinks of alcohol    Types: 7 Glasses of wine per week    Comment: SOCIAL    Allergies  Allergen Reactions   Avelox [Moxifloxacin Hcl In Nacl] Other (See Comments)    Severe stomach cramping and nausea   Penicillins Rash    Current Meds  Medication Sig   acetaminophen (TYLENOL) 500 MG tablet Take 500 mg by mouth as needed.   albuterol (VENTOLIN HFA) 108 (90 Base) MCG/ACT inhaler Inhale 2 puffs into the lungs every 4 (four) hours as needed.   aspirin 81 MG tablet Take 81 mg by mouth daily.   cetirizine (ZYRTEC)  10 MG tablet Take 10 mg by mouth daily.   hyoscyamine (LEVBID) 0.375 MG 12 hr tablet Take 1 tablet (0.375 mg total) by mouth every 12 (twelve) hours as needed.   ibuprofen (ADVIL,MOTRIN) 200 MG tablet Take 200 mg by mouth as needed.   loperamide (IMODIUM A-D) 2 MG tablet Take 1 tablet (2 mg total) by mouth in the morning.   pravastatin (PRAVACHOL) 20 MG tablet TAKE 1 TABLET(20 MG) BY MOUTH AT BEDTIME   pseudoephedrine-guaifenesin (MUCINEX D) 60-600 MG per tablet Take 1 tablet by mouth every 12 (twelve) hours as needed.   sodium chloride (OCEAN) 0.65 % SOLN nasal spray Place 1 spray into both nostrils as needed for congestion.   Tiotropium Bromide Monohydrate (SPIRIVA RESPIMAT) 2.5 MCG/ACT AERS Inhale 2 puffs into the lungs daily.    Immunization History  Administered Date(s) Administered   Covid-19, Mrna,Vaccine(Spikevax)73yrs and older 05/21/2022   Fluad Quad(high Dose 65+) 02/08/2019, 03/07/2022   Influenza Split 03/14/2011, 02/20/2012   Influenza Whole 03/21/2010   Influenza, High Dose Seasonal PF 02/18/2017   Influenza,inj,Quad PF,6+ Mos 02/14/2013, 02/27/2014, 01/25/2015   Influenza-Unspecified 02/18/2017, 02/11/2020   Moderna Covid-19 Vaccine Bivalent Booster 42yrs & up 03/07/2021   Moderna Sars-Covid-2 Vaccination 05/26/2019, 06/23/2019, 03/12/2020, 09/04/2020, 09/12/2021    PNEUMOCOCCAL CONJUGATE-20 10/23/2021   Pneumococcal Conjugate-13 11/21/2013   Pneumococcal Polysaccharide-23 09/03/2010   Respiratory Syncytial Virus Vaccine,Recomb Aduvanted(Arexvy) 03/21/2022   Td 09/03/2010   Zoster Recombinant(Shingrix) 09/17/2015, 09/15/2017   Zoster, Live 08/22/2009        Objective:   BP 124/82 (BP Location: Left Arm, Cuff Size: Normal)   Pulse 87   Temp 98.2 F (36.8 C)   Ht 5\' 1"  (1.549 m)   Wt 154 lb 3.2 oz (69.9 kg)   SpO2 96%   BMI 29.14 kg/m   SpO2: 96 % O2 Device: None (Room air)  GENERAL: Well-developed, well-nourished woman, no acute distress.  Fully ambulatory, no conversational dyspnea. HEAD: Normocephalic, atraumatic.  EYES: Pupils equal, round, reactive to light.  No scleral icterus.  MOUTH: Oral mucosa moist.  No thrush. NECK: Supple. No thyromegaly. Trachea midline. No JVD.  No adenopathy. PULMONARY: Good air entry bilaterally.  Coarse, otherwise, no adventitious sounds. CARDIOVASCULAR: S1 and S2. Regular rate and rhythm.  No rubs, murmurs or gallops heard. ABDOMEN: Benign. MUSCULOSKELETAL: No joint deformity, no clubbing, no edema.  NEUROLOGIC: No overt focal deficit, no gait disturbance, speech is fluent. SKIN: Intact,warm,dry. PSYCH: Mood and behavior normal   Representative image from CT performed 13 December 2022 showing an 8 mm lobulated nodule on the left upper lobe (arrow):     PET/CT performed 05 January 2023 has not been interpreted by radiologist yet.  On my independent review there is no activity on the nodule however on nonfused, nuclear images activity is evident.  This will likely be read as low activity.  However again radiologist has not interpreted the study.  Assessment & Plan:     ICD-10-CM   1. Lung nodule  R91.1    Patient would like to defer workup for now Recommend follow-up CT in 3 months If any growth on nodule recommend excision    2. Stage 2 moderate COPD by GOLD classification (HCC)  J44.9    Trial of  Spiriva Respimat 2 puffs daily Samples provided for the patient Continue as needed albuterol    3. Tobacco dependence due to cigarettes  F17.210    Patient counseled regards to discontinuation of smoking Total counseling time 3-5 minutes  Meds ordered this encounter  Medications   Tiotropium Bromide Monohydrate (SPIRIVA RESPIMAT) 2.5 MCG/ACT AERS    Sig: Inhale 2 puffs into the lungs daily.    Dispense:  8 g    Refill:  0    Order Specific Question:   Lot Number?    Answer:   [id number]    Order Specific Question:   Expiration Date?    Answer:   08/18/2023    Order Specific Question:   Quantity    Answer:   2   We discussed the options for next steps with the patient.  The patient is about to embark on a cruise and then go to [place] for her next 6 months.  She would like to defer workup until this is done.  I encouraged her to see her pulmonologist Dr. Esaw Dace, in [place] as soon as she arrives there.  I also encouraged her to get copies of the CT chest and the PET/CT so that these can be reviewed by her pulmonologist in [place].  Once I know the results from the radiologist on her PET/CT I will confirm with her the findings of what we discussed today.  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  Recommended that chest CT be repeated in 3 months time at the minimum and if there is any significant change proceed to either biopsy which would have to be made by robotic means or excision.  The patient understands robotic bronchoscopy would have significant risk of pneumothorax due to the proximity of the lesion to the pleura.  However, another option would be to excise the lesion with either wedge resection or lobectomy.  The patient's lung function appears to be relatively well-preserved to tolerate surgery.  Having said that, she understands it is imperative that she quit  smoking because if she needs procedures moving on forward ongoing cigarette use will increase her risks for complications/morbidity with any procedure performed.  We provided patient with samples of Spiriva Respimat today to see if this will help with her COPD symptoms without causing her undue side effect.  She is to let us know how this medication does for her so we can call the prescription in for her.  She expects to return to this area towards the end of April and we will schedule her for follow-up at that time.  She is to contact us prior to that time should any new difficulties arise.   Gailen Shelter, MD Advanced Bronchoscopy [facility]    *This note was dictated using voice recognition software/Dragon.  Despite best efforts to proofread, errors can occur which can change the meaning. Any transcriptional errors that result from this process are unintentional and may not be fully corrected at the time of dictation.

## 2023-01-07 NOTE — Patient Instructions (Addendum)
We discussed the findings on your CT of the chest.  Given your upcoming travel schedule, I recommend that you follow-up with your pulmonologist in [place] when you arrive there and have another CT done.  If there is ANY growth on the nodule on your left lung then I recommend that you have consultation with a thoracic surgeon for excision.  I have given you a sample of Spiriva this is an inhaler is 2 puffs once a day.  Please let us know how you do with this inhaler.  You can still use your albuterol inhaler as needed.  We will see you in follow-up in towards the end of April beginning of May upon your return from [place].

## 2023-01-10 ENCOUNTER — Encounter: Payer: Self-pay | Admitting: Pulmonary Disease

## 2023-01-14 ENCOUNTER — Institutional Professional Consult (permissible substitution): Payer: Medicare Other | Admitting: Pulmonary Disease

## 2023-01-14 ENCOUNTER — Encounter: Payer: Self-pay | Admitting: Pulmonary Disease

## 2023-01-14 ENCOUNTER — Ambulatory Visit (INDEPENDENT_AMBULATORY_CARE_PROVIDER_SITE_OTHER): Payer: Medicare Other | Admitting: *Deleted

## 2023-01-14 VITALS — Ht 61.0 in | Wt 150.0 lb

## 2023-01-14 DIAGNOSIS — Z0000 Encounter for general adult medical examination without abnormal findings: Secondary | ICD-10-CM | POA: Diagnosis not present

## 2023-01-14 MED ORDER — SPIRIVA RESPIMAT 2.5 MCG/ACT IN AERS: 2.0000 | INHALATION_SPRAY | Freq: Every day | RESPIRATORY_TRACT | 5 refills | Status: AC

## 2023-01-14 NOTE — Patient Instructions (Signed)
[name] , Thank you for taking time to come for your Medicare Wellness Visit. I appreciate your ongoing commitment to your health goals. Please review the following plan we discussed and let me know if I can assist you in the future.   Referrals/Orders/Follow-Ups/Clinician Recommendations: None  This is a list of the screening recommended for you and due dates:  Health Maintenance  Topic Date Due   DTaP/Tdap/Td vaccine (2 - Tdap) 09/02/2020   COVID-19 Vaccine (8 - 2023-24 season) 09/19/2022   Flu Shot  12/18/2022   Mammogram  02/19/2023   Screening for Lung Cancer  12/15/2023   Medicare Annual Wellness Visit  01/14/2024   Colon Cancer Screening  10/19/2025   Pneumonia Vaccine  Completed   DEXA scan (bone density measurement)  Completed   Hepatitis C Screening  Completed   Zoster (Shingles) Vaccine  Completed   HPV Vaccine  Aged Out    Advanced directives: (Copy Requested) Please bring a copy of your health care power of attorney and living will to the office to be added to your chart at your convenience.  Next Medicare Annual Wellness Visit scheduled for next year: Yes 01/19/24 @ 11:15

## 2023-01-14 NOTE — Progress Notes (Signed)
Subjective:   Abigail Willis is a 77 y.o. female who presents for Medicare Annual (Subsequent) preventive examination.  Visit Complete: Virtual  I connected with  Abigail Willis on 01/14/23 by a audio enabled telemedicine application and verified that I am speaking with the correct person using two identifiers.  Patient Location: Home  Provider Location: Office/Clinic  I discussed the limitations of evaluation and management by telemedicine. The patient expressed understanding and agreed to proceed.  Patient Medicare AWV questionnaire was completed by the patient on 01/10/23; I have confirmed that all information answered by patient is correct and no changes since this date.   Vital Signs: Unable to obtain new vitals due to this being a telehealth visit.   Review of Systems     Cardiac Risk Factors include: dyslipidemia;smoking/ tobacco exposure     Objective:    Today's Vitals   01/14/23 1000  Weight: 150 lb (68 kg)  Height: 5\' 1"  (1.549 m)   Body mass index is 28.34 kg/m.     01/14/2023   10:09 AM 01/10/2022    9:58 AM 09/24/2021    7:37 AM 01/08/2021    3:00 PM 10/10/2020    1:45 PM 09/25/2020   10:59 AM 01/06/2020   11:51 AM  Advanced Directives  Does Patient Have a Medical Advance Directive? Yes Yes Yes Yes Yes Yes Yes  Type of Estate agent of [place];Living will Healthcare Power of [employer] of [place];Living will Healthcare Power of [place];Living will Healthcare Power of [place];Living will Healthcare Power of [place];Living will Healthcare Power of [place];Living will  Does patient want to make changes to medical advance directive?  No - Patient declined No - Patient declined No - Patient declined No - Patient declined No - Patient declined No - Patient declined  Copy of Healthcare Power of Attorney in Chart? No - copy requested No - copy requested No - copy requested No - copy requested  No - copy  requested No - copy requested    Current Medications (verified) Outpatient Encounter Medications as of 01/14/2023  Medication Sig   acetaminophen (TYLENOL) 500 MG tablet Take 500 mg by mouth as needed.   albuterol (VENTOLIN HFA) 108 (90 Base) MCG/ACT inhaler Inhale 2 puffs into the lungs every 4 (four) hours as needed.   aspirin 81 MG tablet Take 81 mg by mouth daily.   cetirizine (ZYRTEC) 10 MG tablet Take 10 mg by mouth daily.   hyoscyamine (LEVBID) 0.375 MG 12 hr tablet Take 1 tablet (0.375 mg total) by mouth every 12 (twelve) hours as needed.   ibuprofen (ADVIL,MOTRIN) 200 MG tablet Take 200 mg by mouth as needed.   loperamide (IMODIUM A-D) 2 MG tablet Take 1 tablet (2 mg total) by mouth in the morning.   pravastatin (PRAVACHOL) 20 MG tablet TAKE 1 TABLET(20 MG) BY MOUTH AT BEDTIME   pseudoephedrine-guaifenesin (MUCINEX D) 60-600 MG per tablet Take 1 tablet by mouth every 12 (twelve) hours as needed.   sodium chloride (OCEAN) 0.65 % SOLN nasal spray Place 1 spray into both nostrils as needed for congestion.   Tiotropium Bromide Monohydrate (SPIRIVA RESPIMAT) 2.5 MCG/ACT AERS Inhale 2 puffs into the lungs daily.   No facility-administered encounter medications on file as of 01/14/2023.    Allergies (verified) Avelox [moxifloxacin hcl in nacl] and Penicillins   History: Past Medical History:  Diagnosis Date   Allergy    SINUS   Cancer (HCC)    skin  Cataract    bilateral   Colon polyps 11/14/2013   Tubular adenoma, hyperplastic polyps   COPD (chronic obstructive pulmonary disease) (HCC)    Diverticulosis 11/15/2010   Moderate   External hemorrhoids    Hyperlipidemia    IBS (irritable bowel syndrome)    Osteoporosis    Squamous cell carcinoma in situ    Vertigo    Past Surgical History:  Procedure Laterality Date   BREAST BIOPSY     CATARACT EXTRACTION W/PHACO Right 09/24/2021   Procedure: CATARACT EXTRACTION PHACO AND INTRAOCULAR LENS PLACEMENT (IOC) RIGHT;  Surgeon:  Galen Manila, MD;  Location: [facility];  Service: Ophthalmology;  Laterality: Right;  15.97 01:15.5   CATARACT EXTRACTION W/PHACO Left 10/08/2021   Procedure: CATARACT EXTRACTION PHACO AND INTRAOCULAR LENS PLACEMENT (IOC) LEFT;  Surgeon: Galen Manila, MD;  Location: [facility];  Service: Ophthalmology;  Laterality: Left;  9.98 0:57.7   COLONOSCOPY     GANGLION CYST EXCISION     HAMMER TOE SURGERY     HEMORRHOID BANDING     LEEP      twice, in [place]   MOHS SURGERY  2019   PAROTIDECTOMY Left    08/27/22 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   TONSILLECTOMY     TUBAL LIGATION     Family History  Problem Relation Age of Onset   Heart attack Mother 25   Ovarian cancer Mother    Asthma Father    Brain cancer Sister    Colon cancer Cousin    Breast cancer Neg Hx    Adrenal disorder Neg Hx    Esophageal cancer Neg Hx    Rectal cancer Neg Hx    Stomach cancer Neg Hx    Social History   Socioeconomic History   Marital status: Married    Spouse name: Not on file   Number of children: 1   Years of education: Not on file   Highest education level: Bachelor's degree (e.g., BA, AB, BS)  Occupational History   Occupation: Retired  Tobacco Use   Smoking status: Every Day    Current packs/day: 1.00    Average packs/day: 1 pack/day for 52.0 years (52.0 ttl pk-yrs)    Types: Cigarettes   Smokeless tobacco: Never   Tobacco comments:    0.75 PPD for 55 years. 01/07/2023 khj  Vaping Use   Vaping status: Never Used  Substance and Sexual Activity   Alcohol use: Yes    Alcohol/week: 7.0 standard drinks of alcohol    Types: 7 Glasses of wine per week    Comment: SOCIAL   Drug use: No   Sexual activity: Yes  Other Topics Concern   Not on file  Social History Narrative   Moved from [place] to be closer to her daughter, spends winters at [facility] home.      Has HPOA-  husband Broadus John.   Would desire feeding tube, no prolonged life support.   Not sure about feeding tubes.            Social Determinants of Health   Financial Resource Strain: Low Risk  (01/10/2023)   Overall Financial Resource Strain (CARDIA)    Difficulty of Paying Living Expenses: Not hard at all  Food Insecurity: No Food Insecurity (01/10/2023)   Hunger Vital Sign    Worried About Running Out of Food in the Last Year: Never true  Ran Out of Food in the Last Year: Never true  Transportation Needs: No Transportation Needs (01/10/2023)   PRAPARE - Administrator, Civil Service (Medical): No    Lack of Transportation (Non-Medical): No  Physical Activity: Insufficiently Active (01/10/2023)   Exercise Vital Sign    Days of Exercise per Week: 2 days    Minutes of Exercise per Session: 60 min  Stress: No Stress Concern Present (01/10/2023)   [employer] of Occupational Health - Occupational Stress Questionnaire    Feeling of Stress : Not at all  Social Connections: Socially Integrated (01/10/2023)   Social Connection and Isolation Panel [NHANES]    Frequency of Communication with Friends and Family: More than three times a week    Frequency of Social Gatherings with Friends and Family: Twice a week    Attends Religious Services: More than 4 times per year    Active Member of [employer] or Organizations: Yes    Attends Engineer, structural: More than 4 times per year    Marital Status: Married    Tobacco Counseling Ready to quit: Yes Counseling given: Not Answered Tobacco comments: 0.75 PPD for 55 years. 01/07/2023 khj   Clinical Intake:  Pre-visit preparation completed: Yes  Pain : No/denies pain     BMI - recorded: 28.34 Nutritional Status: BMI 25 -29 Overweight Nutritional Risks: None Diabetes: No  How often do you need to have someone help you when you read instructions, pamphlets, or other written materials from your doctor or pharmacy?: 1 -  Never  Interpreter Needed?: No  Information entered by :: R. Duwan Adrian LPN   Activities of Daily Living    01/10/2023   10:03 AM  In your present state of health, do you have any difficulty performing the following activities:  Hearing? 0  Vision? 0  Difficulty concentrating or making decisions? 0  Walking or climbing stairs? 0  Dressing or bathing? 0  Doing errands, shopping? 0  Preparing Food and eating ? N  Using the Toilet? N  In the past six months, have you accidently leaked urine? N  Do you have problems with loss of bowel control? N  Managing your Medications? N  Managing your Finances? N  Housekeeping or managing your Housekeeping? N    Patient Care Team: Allegra Grana, FNP as PCP - General (Family Medicine) Rachael Fee, MD (Gastroenterology) Lockie Mola, MD (Ophthalmology) Sharyne Richters, DO (Dermatology)  Indicate any recent Medical Services you may have received from other than [facility] providers in the past year (date may be approximate).     Assessment:   This is a routine wellness examination for [place]  Hearing/Vision screen Hearing Screening - Comments:: No issues Vision Screening - Comments:: Glasses  Dietary issues and exercise activities discussed:     Goals Addressed             This Visit's Progress    Patient Stated       Quit smoking, more travel       Depression Screen    01/14/2023   10:05 AM 10/20/2022    9:00 AM 01/10/2022    9:57 AM 10/23/2021    8:08 AM 01/08/2021    2:59 PM 10/17/2020   10:42 AM 09/12/2020    8:52 AM  PHQ 2/9 Scores  PHQ - 2 Score 0 0 0 0 0 0 0  PHQ- 9 Score 0 0         Fall Risk  01/10/2023   10:03 AM 10/20/2022    9:00 AM 01/10/2022    9:59 AM 10/23/2021    8:08 AM 01/08/2021    3:01 PM  Fall Risk   Falls in the past year? 0 0 0 0 0  Number falls in past yr: 0 0 0 0   Injury with Fall? 0 0  0 0  Risk for fall due to : No Fall Risks No Fall Risks  No Fall Risks   Follow up Falls prevention  discussed;Falls evaluation completed Falls evaluation completed Falls evaluation completed Falls evaluation completed Falls evaluation completed    MEDICARE RISK AT HOME: Medicare Risk at Home Any stairs in or around the home?: Yes If so, are there any without handrails?: No Home free of loose throw rugs in walkways, pet beds, electrical cords, etc?: Yes Adequate lighting in your home to reduce risk of falls?: Yes Life alert?: No Use of a cane, walker or w/c?: No Grab bars in the bathroom?: No Shower chair or bench in shower?: Yes Elevated toilet seat or a handicapped toilet?: Yes      Cognitive Function:    11/27/2016    2:40 PM  MMSE - Mini Mental State Exam  Orientation to time 5  Orientation to Place 5  Registration 3  Attention/ Calculation 0  Recall 3  Language- name 2 objects 0  Language- repeat 1  Language- follow 3 step command 3  Language- read & follow direction 0  Write a sentence 0  Copy design 0  Total score 20        01/14/2023   10:09 AM 01/10/2022    9:37 AM  6CIT Screen  What Year? 0 points 0 points  What month? 0 points 0 points  What time? 0 points 0 points  Count back from 20 0 points 0 points  Months in reverse 0 points 0 points  Repeat phrase 2 points 0 points  Total Score 2 points 0 points    Immunizations Immunization History  Administered Date(s) Administered   Covid-19, Mrna,Vaccine(Spikevax)53yrs and older 05/21/2022   Fluad Quad(high Dose 65+) 02/08/2019, 03/07/2022   Influenza Split 03/14/2011, 02/20/2012   Influenza Whole 03/21/2010   Influenza, High Dose Seasonal PF 02/18/2017   Influenza,inj,Quad PF,6+ Mos 02/14/2013, 02/27/2014, 01/25/2015   Influenza-Unspecified 02/18/2017, 02/11/2020   Moderna Covid-19 Vaccine Bivalent Booster 14yrs & up 03/07/2021   Moderna Sars-Covid-2 Vaccination 05/26/2019, 06/23/2019, 03/12/2020, 09/04/2020, 09/12/2021   PNEUMOCOCCAL CONJUGATE-20 10/23/2021   Pneumococcal Conjugate-13 11/21/2013    Pneumococcal Polysaccharide-23 09/03/2010   Respiratory Syncytial Virus Vaccine,Recomb Aduvanted(Arexvy) 03/21/2022   Td 09/03/2010   Zoster Recombinant(Shingrix) 09/17/2015, 09/15/2017   Zoster, Live 08/22/2009    TDAP status: Due, Education has been provided regarding the importance of this vaccine. Advised may receive this vaccine at local pharmacy or Health Dept. Aware to provide a copy of the vaccination record if obtained from local pharmacy or Health Dept. Verbalized acceptance and understanding.  Flu Vaccine status: Up to date  Pneumococcal vaccine status: Up to date  Covid-19 vaccine status: Completed vaccines  Qualifies for Shingles Vaccine? Yes   Zostavax completed Yes   Shingrix Completed?: Yes  Screening Tests Health Maintenance  Topic Date Due   DTaP/Tdap/Td (2 - Tdap) 09/02/2020   COVID-19 Vaccine (8 - 2023-24 season) 09/19/2022   Medicare Annual Wellness (AWV)  01/11/2023   INFLUENZA VACCINE  12/18/2022   MAMMOGRAM  02/19/2023   Lung Cancer Screening  12/15/2023   Colonoscopy  10/19/2025   Pneumonia  Vaccine 1+ Years old  Completed   DEXA SCAN  Completed   Hepatitis C Screening  Completed   Zoster Vaccines- Shingrix  Completed   HPV VACCINES  Aged Out    Health Maintenance  Health Maintenance Due  Topic Date Due   DTaP/Tdap/Td (2 - Tdap) 09/02/2020   COVID-19 Vaccine (8 - 2023-24 season) 09/19/2022   Medicare Annual Wellness (AWV)  01/11/2023   INFLUENZA VACCINE  12/18/2022    Colorectal cancer screening: Type of screening: Colonoscopy. Completed 6/22. Repeat every 5 years Per patient no needed anymore because of age  Mammogram status: Completed 10/23. Repeat every year Appointment already scheduled  Bone Density status: Completed 1/24. Results reflect: Bone density results: OSTEOPOROSIS. Repeat every 2 years.  Lung Cancer Screening: (Low Dose CT Chest recommended if Age 15-80 years, 20 pack-year currently smoking OR have quit w/in 15years.) does  qualify. Completed 12/15/22    Additional Screening:  Hepatitis C Screening: does qualify; Completed 7/20  Vision Screening: Recommended annual ophthalmology exams for early detection of glaucoma and other disorders of the eye. Is the patient up to date with their annual eye exam?  Yes  Who is the provider or what is the name of the office in which the patient attends annual eye exams? [facility] If pt is not established with a provider, would they like to be referred to a provider to establish care? No .   Dental Screening: Recommended annual dental exams for proper oral hygiene   Community Resource Referral / Chronic Care Management: CRR required this visit?  No   CCM required this visit?  No     Plan:     I have personally reviewed and noted the following in the patient's chart:   Medical and social history Use of alcohol, tobacco or illicit drugs  Current medications and supplements including opioid prescriptions. Patient is not currently taking opioid prescriptions. Functional ability and status Nutritional status Physical activity Advanced directives List of other physicians Hospitalizations, surgeries, and ER visits in previous 12 months Vitals Screenings to include cognitive, depression, and falls Referrals and appointments  In addition, I have reviewed and discussed with patient certain preventive protocols, quality metrics, and best practice recommendations. A written personalized care plan for preventive services as well as general preventive health recommendations were provided to patient.     Sydell Axon, LPN   07/02/863   After Visit Summary: (MyChart) Due to this being a telephonic visit, the after visit summary with patients personalized plan was offered to patient via MyChart   Nurse Notes: None

## 2023-01-20 ENCOUNTER — Encounter: Payer: Self-pay | Admitting: Pulmonary Disease

## 2023-01-21 ENCOUNTER — Telehealth: Payer: Self-pay | Admitting: Acute Care

## 2023-01-21 NOTE — Telephone Encounter (Signed)
Noted, essentially what I discussed with the patient previously.  Refer to my note from 07 January 2023.  Agree with assessment and plan.

## 2023-01-21 NOTE — Telephone Encounter (Signed)
Patient had called with the patient advice request to go over the PET scan results.  Patient had been seen by Dr. Jayme Cloud on 821 and the PET scan had not been officially read at that time. Per the radiology read there is an 11 mm anterior segment of the left upper lobe that did not show metabolism above blood pool but was visualized and shows indolent growth/solidification when compared to prior CTs.  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 Patient lives at half time in [place] and the other half time in [place]. Dr. Jayme Cloud advised the patient to follow-up with her pulmonologist in [place] as soon as she gets there. She has CDs with both the CT scan and PET scan on them for the pulmonologist in [place] to review. I have asked her to review the PET scan with the pulmonologist to ensure he does not want to move forward with biopsy of the left upper lobe of concern. She confirmed that she has both the PET scan and the CT results on disc and we will review them with her pulmonologist in [place] as soon as she arrives there. There was also notation of a focal hypermetabolism within the rectum.  However the rectum was under distended and that limited complete evaluation. Patient states that she had a colonoscopy in 2022 by Dr. Perry Mount I have asked her to call [facility] and have one of the GI physicians reviewed the PET scan and advise if she needs further evaluation of that finding. She verbalized understanding and stated that she would do this. There was also notation of a left adrenal adenoma, aortic atherosclerosis and coronary artery calcifications, in addition to emphysema.  Patient was already aware of these findings. 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 She states that this is not causing any discomfort or numbness in her legs.  I have advised he to follow-up with orthopedic medicine if she develops any back pain or leg  numbness as this can be treated. She verbalized understanding Patient will be leaving shortly to go on a cruise and then she will be in [place] until spring 2025.  She has all imaging to review with her pulmonary doctor in [place]. She had no further questions or concerns at completion of the call and had full understanding that she needs to follow-up with pulmonary when she gets to [place].

## 2023-02-20 ENCOUNTER — Ambulatory Visit
Admission: RE | Admit: 2023-02-20 | Discharge: 2023-02-20 | Disposition: A | Discharge status: Home / Self Care | Payer: Medicare Other | Source: Ambulatory Visit | Attending: Family | Admitting: Family

## 2023-02-20 DIAGNOSIS — Z1231 Encounter for screening mammogram for malignant neoplasm of breast: Secondary | ICD-10-CM

## 2023-10-22 ENCOUNTER — Ambulatory Visit: Payer: Self-pay | Admitting: Family

## 2023-10-22 ENCOUNTER — Ambulatory Visit: Admitting: Family

## 2023-10-22 ENCOUNTER — Encounter: Payer: Self-pay | Admitting: Family

## 2023-10-22 VITALS — BP 128/76 | HR 80 | Temp 97.7°F | Ht 61.0 in | Wt 159.2 lb

## 2023-10-22 DIAGNOSIS — R911 Solitary pulmonary nodule: Secondary | ICD-10-CM | POA: Diagnosis not present

## 2023-10-22 DIAGNOSIS — E278 Other specified disorders of adrenal gland: Secondary | ICD-10-CM

## 2023-10-22 DIAGNOSIS — I2510 Atherosclerotic heart disease of native coronary artery without angina pectoris: Secondary | ICD-10-CM | POA: Diagnosis not present

## 2023-10-22 DIAGNOSIS — D582 Other hemoglobinopathies: Secondary | ICD-10-CM

## 2023-10-22 LAB — CBC WITH DIFFERENTIAL/PLATELET
Basophils Absolute: 0 10*3/uL (ref 0.0–0.1)
Basophils Relative: 0.4 % (ref 0.0–3.0)
Eosinophils Absolute: 0.2 10*3/uL (ref 0.0–0.7)
Eosinophils Relative: 2.1 % (ref 0.0–5.0)
HCT: 45.3 % (ref 36.0–46.0)
Hemoglobin: 15.5 g/dL — ABNORMAL HIGH (ref 12.0–15.0)
Lymphocytes Relative: 20.7 % (ref 12.0–46.0)
Lymphs Abs: 2 10*3/uL (ref 0.7–4.0)
MCHC: 34.1 g/dL (ref 30.0–36.0)
MCV: 89.5 fl (ref 78.0–100.0)
Monocytes Absolute: 0.6 10*3/uL (ref 0.1–1.0)
Monocytes Relative: 5.8 % (ref 3.0–12.0)
Neutro Abs: 6.9 10*3/uL (ref 1.4–7.7)
Neutrophils Relative %: 71 % (ref 43.0–77.0)
Platelets: 300 10*3/uL (ref 150.0–400.0)
RBC: 5.06 Mil/uL (ref 3.87–5.11)
RDW: 13.3 % (ref 11.5–15.5)
WBC: 9.7 10*3/uL (ref 4.0–10.5)

## 2023-10-22 LAB — COMPREHENSIVE METABOLIC PANEL WITH GFR
ALT: 11 U/L (ref 0–35)
AST: 17 U/L (ref 0–37)
Albumin: 4 g/dL (ref 3.5–5.2)
Alkaline Phosphatase: 76 U/L (ref 39–117)
BUN: 10 mg/dL (ref 6–23)
CO2: 27 meq/L (ref 19–32)
Calcium: 9.3 mg/dL (ref 8.4–10.5)
Chloride: 101 meq/L (ref 96–112)
Creatinine, Ser: 0.59 mg/dL (ref 0.40–1.20)
GFR: 86.31 mL/min (ref >60.00)
Glucose, Bld: 98 mg/dL (ref 70–99)
Potassium: 4.9 meq/L (ref 3.5–5.1)
Sodium: 140 meq/L (ref 135–145)
Total Bilirubin: 0.4 mg/dL (ref 0.2–1.2)
Total Protein: 7 g/dL (ref 6.0–8.3)

## 2023-10-22 LAB — LIPID PANEL
Cholesterol: 185 mg/dL (ref 0–200)
HDL: 75.3 mg/dL (ref >39.00)
LDL Cholesterol: 85 mg/dL (ref 0–99)
NonHDL: 109.53
Total CHOL/HDL Ratio: 2
Triglycerides: 124 mg/dL (ref 0.0–149.0)
VLDL: 24.8 mg/dL (ref 0.0–40.0)

## 2023-10-22 LAB — B12 AND FOLATE PANEL
Folate: 14 ng/mL (ref >5.9)
Vitamin B-12: 314 pg/mL (ref 211–911)

## 2023-10-22 MED ORDER — PRAVASTATIN SODIUM 20 MG PO TABS: ORAL_TABLET | ORAL | 3 refills | Status: AC

## 2023-10-22 NOTE — Assessment & Plan Note (Signed)
 Reviewed most recent  CT chest 07/08/23 ,  bilateral adrenal gland thickening, stable.  Stable since 2022. Will continue to monitor incidentally with CT chest through pulmonology.

## 2023-10-22 NOTE — Assessment & Plan Note (Signed)
 Reviewed Dr Denese Finn consult. Former smoker. Pending CBC.

## 2023-10-22 NOTE — Progress Notes (Signed)
 Assessment & Plan:  Coronary artery calcification -     Pravastatin  Sodium; TAKE 1 TABLET(20 MG) BY MOUTH AT BEDTIME  Dispense: 90 tablet; Refill: 3 -     CBC with Differential/Platelet -     Comprehensive metabolic panel with GFR -     E45 and Folate Panel -     Lipid panel  Elevated hemoglobin (HCC) Assessment & Plan: Reviewed Dr Denese Finn consult. Former smoker. Pending CBC.   Adrenal hyperplasia [facility]) Assessment & Plan: Reviewed most recent  CT chest 07/08/23 ,  bilateral adrenal gland thickening, stable.  Stable since 2022. Will continue to monitor incidentally with CT chest through pulmonology.    Nodule of left lung Assessment & Plan: Reviewed most recent CT chest 07/08/2023.  She is following pulmonology in [place]  stable left lung and right lung nodules. She has not had lung biopsy.   She is no longer following with [facility]  pulmonology, Dr. Viva Grise.  She states she is planning repeat CT chest October 2025.  Of note, reviewed PET scan 01/05/23  and rectal hypermetabolism.  I have sent a message to Dr Karene Oto , gastroenterology , to advise if further evaluation needed.  Known history of hemorrhoids.  Fortunately she is asymptomatic      Return precautions given.   Risks, benefits, and alternatives of the medications and treatment plan prescribed today were discussed, and patient expressed understanding.   Education regarding symptom management and diagnosis given to patient on AVS either electronically or printed.  No follow-ups on file.  Abigail Bossier, FNP I have spent 40 minutes with a patient including precharting, exam, reviewing medical records, collaborating with gastroenterology, and discussion plan of care.     Subjective:    Patient ID: Abigail Willis, female    DOB: [birth year], 78 y.o.   MRN: [id number]  CC: Abigail Willis is a 78 y.o. female who presents today for follow up.   HPI: She feels well today She is no longer  smoking No h/o OSA She has co2 detector in her home   She is compliant pravastatin   She remains active.  Denies chest pain, shortness of breath     H/o secondary polycythemia; last seen Dr Randy Buttery 10/10/20  She is following with pulmonology , [place], [place] ; planning for repeat CT chest 02/2024  No longer following with Dr Viva Grise  Mammogram is UTD    CT chest 07/08/23 stable mixed solid and groundglass nodule in the anterior segment of the left upper lobe adjacent to pleura measuring 13 x 11 mm with solid component measuring 7 mm image 52 of series 3. In addition there is stable 5 mm groundglass nodule in the right lower lobe image 76 of series 3. No new or growing pulmonary nodules are identified. No effusions. There is bilateral adrenal gland thickening, stable.   CT chest 03/16/23  1.4 x 0.5 cm left upper lobe pulmonary nodule, more solid than on the prior study. Attention on follow-up.  Interval development of left lower lobe subcentimeter tree-in-bud pulmonary nodules, likely infectious or inflammatory.   CT chest LCS 12/15/22 lung RADS 4B, suspicious.  Stable bilateral adrenal adenomas, aortic atherosclerosis  Follow-up pulmonology [facility] 07/27/2023  lung nodule, COPD Repeat CT chest in 6 months.   Compliant with Spiriva , albuterol   Colonoscopy 2022, Dr Howard Macho, internal and external hemorrhoids.  Denies rectal pain, rectal bleeding, changes in stool habits.   Allergies: Avelox  [moxifloxacin  hcl in nacl] and Penicillins Current Outpatient Medications on  File Prior to Visit  Medication Sig Dispense Refill   acetaminophen  (TYLENOL ) 500 MG tablet Take 500 mg by mouth as needed.     aspirin 81 MG tablet Take 81 mg by mouth daily.     cetirizine (ZYRTEC) 10 MG tablet Take 10 mg by mouth daily.     hyoscyamine  (LEVBID) 0.375 MG 12 hr tablet Take 1 tablet (0.375 mg total) by mouth every 12 (twelve) hours as needed. 60 tablet 6   ibuprofen (ADVIL,MOTRIN) 200 MG tablet  Take 200 mg by mouth as needed.     loperamide  (IMODIUM  A-D) 2 MG tablet Take 1 tablet (2 mg total) by mouth in the morning. 30 tablet 0   pseudoephedrine-guaifenesin (MUCINEX D) 60-600 MG per tablet Take 1 tablet by mouth every 12 (twelve) hours as needed.     sodium chloride  (OCEAN) 0.65 % SOLN nasal spray Place 1 spray into both nostrils as needed for congestion.     Tiotropium Bromide Monohydrate  (SPIRIVA  RESPIMAT) 2.5 MCG/ACT AERS Inhale 2 puffs into the lungs daily. 1 each 5   No current facility-administered medications on file prior to visit.    Review of Systems  Constitutional:  Negative for chills and fever.  Respiratory:  Negative for cough and shortness of breath.   Cardiovascular:  Negative for chest pain and palpitations.  Gastrointestinal:  Negative for nausea and vomiting.      Objective:    BP 128/76   Pulse 80   Temp 97.7 F (36.5 C) (Oral)   Ht 5\' 1"  (1.549 m)   Wt 159 lb 3.2 oz (72.2 kg)   SpO2 96%   BMI 30.08 kg/m  BP Readings from Last 3 Encounters:  10/22/23 128/76  01/07/23 124/82  10/20/22 128/76   Wt Readings from Last 3 Encounters:  10/22/23 159 lb 3.2 oz (72.2 kg)  01/14/23 150 lb (68 kg)  01/07/23 154 lb 3.2 oz (69.9 kg)    Physical Exam Vitals reviewed.  Constitutional:      Appearance: Normal appearance. She is well-developed.  Eyes:     Conjunctiva/sclera: Conjunctivae normal.  Cardiovascular:     Rate and Rhythm: Normal rate and regular rhythm.     Pulses: Normal pulses.     Heart sounds: Normal heart sounds.  Pulmonary:     Effort: Pulmonary effort is normal.     Breath sounds: Normal breath sounds. No wheezing, rhonchi or rales.  Abdominal:     General: Bowel sounds are normal. There is no distension.     Palpations: Abdomen is soft. Abdomen is not rigid. There is no fluid wave or mass.     Tenderness: There is no abdominal tenderness. There is no guarding or rebound.  Skin:    General: Skin is warm and dry.  Neurological:      Mental Status: She is alert.  Psychiatric:        Speech: Speech normal.        Behavior: Behavior normal.        Thought Content: Thought content normal.

## 2023-10-22 NOTE — Assessment & Plan Note (Addendum)
 Reviewed most recent CT chest 07/08/2023.  She is following pulmonology in [place]  stable left lung and right lung nodules. She has not had lung biopsy.   She is no longer following with [facility]  pulmonology, Dr. Viva Grise.  She states she is planning repeat CT chest October 2025.  Of note, reviewed PET scan 01/05/23  and rectal hypermetabolism.  I have sent a message to Dr Karene Oto , gastroenterology , to advise if further evaluation needed.  Known history of hemorrhoids.  Fortunately she is asymptomatic

## 2023-10-29 ENCOUNTER — Other Ambulatory Visit: Payer: Self-pay | Admitting: Family

## 2023-10-29 DIAGNOSIS — Z1231 Encounter for screening mammogram for malignant neoplasm of breast: Secondary | ICD-10-CM

## 2023-11-06 ENCOUNTER — Encounter: Payer: Self-pay | Admitting: Family

## 2023-11-30 ENCOUNTER — Ambulatory Visit (INDEPENDENT_AMBULATORY_CARE_PROVIDER_SITE_OTHER): Admitting: Internal Medicine

## 2023-11-30 ENCOUNTER — Encounter: Payer: Self-pay | Admitting: Internal Medicine

## 2023-11-30 VITALS — BP 120/76 | HR 96 | Temp 98.1°F | Ht 61.0 in | Wt 158.8 lb

## 2023-11-30 DIAGNOSIS — H6690 Otitis media, unspecified, unspecified ear: Secondary | ICD-10-CM | POA: Insufficient documentation

## 2023-11-30 DIAGNOSIS — H6123 Impacted cerumen, bilateral: Secondary | ICD-10-CM | POA: Diagnosis not present

## 2023-11-30 MED ORDER — DEBROX 6.5 % OT SOLN
5.0000 [drp] | Freq: Two times a day (BID) | OTIC | 0 refills | Status: AC
Start: 2023-11-30 — End: ?

## 2023-11-30 MED ORDER — AZITHROMYCIN 500 MG PO TABS
500.0000 mg | ORAL_TABLET | Freq: Every day | ORAL | 0 refills | Status: AC
Start: 2023-11-30 — End: ?

## 2023-11-30 NOTE — Assessment & Plan Note (Signed)
-   Patient complains of decreased hearing bilaterally -On exam, she has impacted cerumen in both ears  - Unable to visualize TM secondary to this -Prescribed Debrox to help soften the earwax -Asked her to return once her infection has resolved for possible lavage and removal of earwax -Would also refer to audiologist once her earwax is removed if she continues to have difficulty hearing

## 2023-11-30 NOTE — Patient Instructions (Addendum)
-   Was a pleasure meeting you today -I prescribed Debrox to help soften the wax in the ears.  Once your infection improves please come back to see us  so we can try and lavage the wax out of the ears -I have prescribed azithromycin  for you to help with the likely middle ear infection.  Please complete a 7-day course of this antibiotic -Please call if you have any questions or concerns or if your symptoms are not improving

## 2023-11-30 NOTE — Assessment & Plan Note (Signed)
-   Patient complains of bilateral ear pain for the last 2 to 3 weeks worse on the right than the left.  Pain is constant, dull but occasionally sharp and throbbing and 8 out of 10 at its worst -Unable to visualize TM on physical exam secondary to impacted cerumen -No sinus tenderness noted -Will treat patient for otitis media with azithromycin  to complete a 7-day course (patient is allergic to penicillin) -Return precautions given to the patient -No further workup at this time

## 2023-11-30 NOTE — Progress Notes (Signed)
 Acute Office Visit  Subjective:     Patient ID: Abigail Willis, female    DOB: June 25, 1945, 78 y.o.   MRN: [id number]  Chief Complaint  Patient presents with   Acute Visit    Earache both ears x 3 weeks Right ear is worse Both ears feel very full Pain 8/10    HPI Patient is in today for bilateral ear pain worse on the right than the left.  Patient states that for the last 3 weeks she has noted significant bilateral ear pain worse on the right than the left.  Patient states the pain is constant, dull but occasionally sharp and throbbing, 8 out of 10 at its worst.  No fevers or chills.  No recent URI.  Patient does state that she has some chronic sinus issues but no recent flares.  She also complains of some decreased hearing and is wondering if she needs hearing aids.  Review of Systems  Constitutional: Negative.   HENT:  Positive for ear pain and hearing loss. Negative for congestion, ear discharge, sinus pain and sore throat.   Respiratory: Negative.    Cardiovascular: Negative.   Gastrointestinal: Negative.   Musculoskeletal: Negative.   Neurological: Negative.   Psychiatric/Behavioral: Negative.          Objective:    BP 120/76   Pulse 96   Temp 98.1 F (36.7 C)   Ht 5' 1 (1.549 m)   Wt 158 lb 12.8 oz (72 kg)   SpO2 96%   BMI 30.00 kg/m    Physical Exam HENT:     Head: Normocephalic and atraumatic.     Right Ear: There is impacted cerumen.     Left Ear: There is impacted cerumen.     Ears:     Comments: Unable to visualize TM bilaterally secondary to cerumen impaction    Nose:     Right Sinus: No maxillary sinus tenderness or frontal sinus tenderness.     Left Sinus: No maxillary sinus tenderness or frontal sinus tenderness.  Cardiovascular:     Rate and Rhythm: Normal rate and regular rhythm.     Heart sounds: Normal heart sounds.  Pulmonary:     Effort: Pulmonary effort is normal. No respiratory distress.     Breath sounds: Normal  breath sounds. No wheezing or rales.  Musculoskeletal:     Cervical back: Neck supple.  Lymphadenopathy:     Cervical: No cervical adenopathy.  Neurological:     Mental Status: She is alert.  Psychiatric:        Mood and Affect: Mood normal.        Behavior: Behavior normal.     No results found for any visits on 11/30/23.      Assessment & Plan:   Problem List Items Addressed This Visit       Nervous and Auditory   Impacted cerumen of both ears   - Patient complains of decreased hearing bilaterally -On exam, she has impacted cerumen in both ears  - Unable to visualize TM secondary to this -Prescribed Debrox to help soften the earwax -Asked her to return once her infection has resolved for possible lavage and removal of earwax -Would also refer to audiologist once her earwax is removed if she continues to have difficulty hearing      Relevant Medications   carbamide peroxide (DEBROX) 6.5 % OTIC solution   Otitis media - Primary   - Patient complains of bilateral ear pain for the  last 2 to 3 weeks worse on the right than the left.  Pain is constant, dull but occasionally sharp and throbbing and 8 out of 10 at its worst -Unable to visualize TM on physical exam secondary to impacted cerumen -No sinus tenderness noted -Will treat patient for otitis media with azithromycin  to complete a 7-day course (patient is allergic to penicillin) -Return precautions given to the patient -No further workup at this time      Relevant Medications   azithromycin  (ZITHROMAX ) 500 MG tablet    Meds ordered this encounter  Medications   carbamide peroxide (DEBROX) 6.5 % OTIC solution    Sig: Place 5 drops into both ears 2 (two) times daily.    Dispense:  15 mL    Refill:  0   azithromycin  (ZITHROMAX ) 500 MG tablet    Sig: Take 1 tablet (500 mg total) by mouth daily.    Dispense:  7 tablet    Refill:  0    No follow-ups on file.  Noal Abshier, MD

## 2023-12-01 ENCOUNTER — Encounter: Payer: Self-pay | Admitting: Gastroenterology

## 2023-12-01 ENCOUNTER — Ambulatory Visit: Admitting: Gastroenterology

## 2023-12-01 VITALS — BP 126/70 | HR 82 | Ht 61.0 in | Wt 158.2 lb

## 2023-12-01 DIAGNOSIS — K640 First degree hemorrhoids: Secondary | ICD-10-CM

## 2023-12-01 DIAGNOSIS — R933 Abnormal findings on diagnostic imaging of other parts of digestive tract: Secondary | ICD-10-CM

## 2023-12-01 DIAGNOSIS — R948 Abnormal results of function studies of other organs and systems: Secondary | ICD-10-CM

## 2023-12-01 DIAGNOSIS — K580 Irritable bowel syndrome with diarrhea: Secondary | ICD-10-CM

## 2023-12-01 MED ORDER — HYOSCYAMINE SULFATE ER 0.375 MG PO TB12
0.3750 mg | ORAL_TABLET | Freq: Two times a day (BID) | ORAL | 6 refills | Status: AC | PRN
Start: 2023-12-01 — End: ?

## 2023-12-01 NOTE — Progress Notes (Signed)
 Chief Complaint: abnormal PET scan Primary GI Doctor:Dr. Cirigiliano  HPI:  Patient is a  78  year old female patient with past medical history of adenomatous poylps, IBS-D, and internal/external hemorrhoids, who was self referred to me  for abnormal PET scan.  Patient last seen in GI office by Dr. Jerie on 01/08/22 for hematochezia. Internal Hemorrhoid banding procedures Alixander Rallis 2018 by Dr. Avram. Plan for conservative management.  07/27/23 seen by pulmonary per note: Pulmonary nodule - PET scan (01/05/2023) 10 x 11 mm nodule in the anterior segment left upper lobe (4/48), SUV max 1.6. No additional abnormal hypermetabolism. - report it states there was concern for slow growing adenocarcinoma. - Follow up CT chest  (07/08/2023) No change. Recommend follow up in 6 months.    Interval History   Patient presents for evaluation of abnormal PET scan in August showing mild focal hypermetabolism associated with the rectum, accompanied by her husband. She discussed with Dr. San and he provided option of flexible sigmoidoscopy versus colonoscopy. She would like to proceed with flexible sigmoidoscopy.    Patient has history of IBS-D. Patient reports she has up to 8 stools per day with her IBS-D. She reports she has about 1 episode per month and will last 2-3 days then subsides. She uses Levbid  prn which works well. She has intermittent issues with her hemorrhoids, has had banding in past.Patient denies nausea, vomiting, or weight loss.   Patient on baby ASA 81 mg po daily.    Wt Readings from Last 3 Encounters:  12/01/23 158 lb 4 oz (71.8 kg)  11/30/23 158 lb 12.8 oz (72 kg)  10/22/23 159 lb 3.2 oz (72.2 kg)   Past Medical History:  Diagnosis Date   Allergy    SINUS   Cancer (HCC)    skin   Cataract    bilateral   Colon polyps 11/14/2013   Tubular adenoma, hyperplastic polyps   COPD (chronic obstructive pulmonary disease) (HCC)    Diverticulosis 11/15/2010   Moderate    External hemorrhoids    Hyperlipidemia    IBS (irritable bowel syndrome)    Osteoporosis    Squamous cell carcinoma in situ    Vertigo     Past Surgical History:  Procedure Laterality Date   BREAST BIOPSY     CATARACT EXTRACTION W/PHACO Right 09/24/2021   Procedure: CATARACT EXTRACTION PHACO AND INTRAOCULAR LENS PLACEMENT (IOC) RIGHT;  Surgeon: Jaye Fallow, MD;  Location: [facility];  Service: Ophthalmology;  Laterality: Right;  15.97 01:15.5   CATARACT EXTRACTION W/PHACO Left 10/08/2021   Procedure: CATARACT EXTRACTION PHACO AND INTRAOCULAR LENS PLACEMENT (IOC) LEFT;  Surgeon: Jaye Fallow, MD;  Location: [facility];  Service: Ophthalmology;  Laterality: Left;  9.98 0:57.7   COLONOSCOPY     GANGLION CYST EXCISION     HAMMER TOE SURGERY     HEMORRHOID BANDING     LEEP      twice, in [place]    MOHS SURGERY  2019   PAROTIDECTOMY Left    08/27/22 [facility], Dr Slater Passy left partial parotidectomy  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     aspirin 81 MG tablet Take 81 mg by mouth daily.     azithromycin  (ZITHROMAX ) 500 MG tablet Take 1  tablet (500 mg total) by mouth daily. 7 tablet 0   carbamide peroxide (DEBROX) 6.5 % OTIC solution Place 5 drops into both ears 2 (two) times daily. 15 mL 0   cetirizine (ZYRTEC) 10 MG tablet Take 10 mg by mouth daily.     ibuprofen (ADVIL,MOTRIN) 200 MG tablet Take 200 mg by mouth as needed.     loperamide  (IMODIUM  A-D) 2 MG tablet Take 1 tablet (2 mg total) by mouth in the morning. 30 tablet 0   pravastatin  (PRAVACHOL ) 20 MG tablet TAKE 1 TABLET(20 MG) BY MOUTH AT BEDTIME 90 tablet 3   pseudoephedrine-guaifenesin (MUCINEX D) 60-600 MG per tablet Take 1 tablet by mouth every 12 (twelve) hours as needed.     sodium chloride   (OCEAN) 0.65 % SOLN nasal spray Place 1 spray into both nostrils as needed for congestion.     Tiotropium Bromide Monohydrate  (SPIRIVA  RESPIMAT) 2.5 MCG/ACT AERS Inhale 2 puffs into the lungs daily. 1 each 5   hyoscyamine  (LEVBID ) 0.375 MG 12 hr tablet Take 1 tablet (0.375 mg total) by mouth every 12 (twelve) hours as needed. 60 tablet 6   No current facility-administered medications for this visit.    Allergies as of 12/01/2023 - Review Complete 12/01/2023  Allergen Reaction Noted   Avelox  [moxifloxacin  hcl in nacl] Other (See Comments) 03/16/2012   Penicillins Rash 03/06/2010    Family History  Problem Relation Age of Onset   Heart attack Mother 25   Ovarian cancer Mother    Asthma Father    Brain cancer Sister    Colon cancer Cousin    Breast cancer Neg Hx    Adrenal disorder Neg Hx    Esophageal cancer Neg Hx    Rectal cancer Neg Hx    Stomach cancer Neg Hx     Review of Systems:    Constitutional: No weight loss, fever, chills, weakness or fatigue HEENT: Eyes: No change in vision               Ears, Nose, Throat:  No change in hearing or congestion Skin: No rash or itching Cardiovascular: No chest pain, chest pressure or palpitations   Respiratory: No SOB or cough Gastrointestinal: See HPI and otherwise negative Genitourinary: No dysuria or change in urinary frequency Neurological: No headache, dizziness or syncope Musculoskeletal: No new muscle or joint pain Hematologic: No bleeding or bruising Psychiatric: No history of depression or anxiety    Physical Exam:  Vital signs: BP 126/70 (BP Location: Left Arm, Patient Position: Sitting, Cuff Size: Normal)   Pulse 82   Ht 5' 1 (1.549 m)   Wt 158 lb 4 oz (71.8 kg)   BMI 29.90 kg/m   Constitutional: Pleasant  female appears to be in NAD, Well developed, Well nourished, alert and cooperative Throat: Oral cavity and pharynx without inflammation, swelling or lesion.  Respiratory: Respirations even and unlabored.  Lungs clear to auscultation bilaterally.   No wheezes, crackles, or rhonchi.  Cardiovascular: Normal S1, S2. Regular rate and rhythm. No peripheral edema, cyanosis or pallor.  Gastrointestinal:  Soft, nondistended, nontender. No rebound or guarding. Normal bowel sounds. No appreciable masses or hepatomegaly. Rectal:  Not performed.  Msk:  Symmetrical without gross deformities. Without edema, no deformity or joint abnormality.  Neurologic:  Alert and  oriented x4;  grossly normal neurologically.  Skin:   Dry and intact without significant lesions or rashes. Psychiatric: Oriented to person, place and time. Demonstrates good judgement and reason without abnormal affect or behaviors.  RELEVANT LABS AND IMAGING: CBC    Latest Ref Rng & Units 10/22/2023    9:06 AM 10/20/2022    9:33 AM 09/17/2021   12:59 PM  CBC  WBC 4.0 - 10.5 K/uL 9.7  9.3  9.5   Hemoglobin 12.0 - 15.0 g/dL 84.4  84.1  84.0   Hematocrit 36.0 - 46.0 % 45.3  47.7  48.1   Platelets 150.0 - 400.0 K/uL 300.0  286.0  266      CMP     Latest Ref Rng & Units 10/22/2023    9:06 AM 10/20/2022    9:33 AM 10/23/2021    8:50 AM  CMP  Glucose 70 - 99 mg/dL 98  99  91   BUN 6 - 23 mg/dL 10  9  11    Creatinine 0.40 - 1.20 mg/dL 9.40  9.35  9.40   Sodium 135 - 145 mEq/L 140  139  139   Potassium 3.5 - 5.1 mEq/L 4.9  5.0  4.2   Chloride 96 - 112 mEq/L 101  102  102   CO2 19 - 32 mEq/L 27  30  30    Calcium 8.4 - 10.5 mg/dL 9.3  9.2  9.0   Total Protein 6.0 - 8.3 g/dL 7.0  6.9  6.5   Total Bilirubin 0.2 - 1.2 mg/dL 0.4  0.5  0.5   Alkaline Phos 39 - 117 U/L 76  67  69   AST 0 - 37 U/L 17  19  18    ALT 0 - 35 U/L 11  12  13       Lab Results  Component Value Date   TSH 1.84 10/20/2022  Imaging: 01/05/2023 PET scan  IMPRESSION: 1. 11 mm anterior segment left upper lobe nodule does not show metabolism above blood pool but is visualized and shows indolent growth/solidification when compared with prior CT examinations. 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 2. Mild focal hypermetabolism associated with the rectum. Rectum is under distended, limiting further evaluation. 3. Left adrenal adenoma. 4. Aortic atherosclerosis (ICD10-I70.0). Coronary artery calcification. 5.  Emphysema (ICD10-J43.9).  Procedures: Colonoscopy 10/2010 Dr. Teressa (for previous adenomatous polyps in [facility] including a 1cm pedunculated polyp in 1998) 4 subCM polyps removed, one was adenomatous, She was recommended to have repeat colonoscopy at 5 year interval.     Colonoscopy September 2017 Dr. Teressa found 3 subcentimeter polyps.  Also internal and external hemorrhoids.  2 of these polyps were sessile serrated adenomas, the other was hyperplastic.   Colonoscopy June 2022 normal terminal ileum, the entire colon appeared slightly erythematous.  Biopsies were taken from right and left colon.  Hemorrhoids were found, medium size.  No polyps or cancers.  Biopsies showed no sign of micro or macro scopic colitis.   Assessment: Encounter Diagnoses  Name Primary?   Abnormal PET scan of colon Yes   Irritable bowel syndrome with diarrhea    Grade I hemorrhoids      78 year old female patient with PET scan (12/2022) that showed mild focal hypermetabolism associated with the rectum. Rectum is under distended, limiting further evaluation. Will proceed with flexible sigmoidoscopy to further evaluate in [facility] with Dr. San. Patient's last colonoscopy June 2022 with random biopsy negative.     Patient has history of IBS-D that is managed with stress management and Levbid  prn. Refilled. Intermittent tissues with hemorrhoids, not interested in hemorrhoid banding at this time.   Plan: - recommend low fodmap diet -continue Levbid  prn, refilled -  Schedule flexible sigmoidoscopy with Dr. Jerie. The risks and benefits of colonoscopy with possible polypectomy / biopsies were discussed and the patient agrees to proceed.    Thank you for the  courtesy of this consult. Please call me with any questions or concerns.   Dynver Clemson, FNP-C [facility] 12/01/2023, 10:24 AM  Cc: Dineen Rollene MATSU, FNP

## 2023-12-01 NOTE — Patient Instructions (Addendum)
 IBS-D Recommend low fodmap diet Sent prescription for Levbid  prn  You have been scheduled for a flexible sigmoidoscopy. Please follow the written instructions given to you at your visit today.  If you use inhalers (even only as needed), please bring them with you on the day of your procedure.  DO NOT TAKE 7 DAYS PRIOR TO TEST- Trulicity (dulaglutide) Ozempic, Wegovy (semaglutide) Mounjaro (tirzepatide) Bydureon Bcise (exanatide extended release)  DO NOT TAKE 1 DAY PRIOR TO YOUR TEST Rybelsus (semaglutide) Adlyxin (lixisenatide) Victoza (liraglutide) Byetta (exanatide) ___________________________________________________________________________ Due to recent changes in healthcare laws, you may see the results of your imaging and laboratory studies on MyChart before your provider has had a chance to review them.  We understand that in some cases there may be results that are confusing or concerning to you. Not all laboratory results come back in the same time frame and the provider may be waiting for multiple results in order to interpret others.  Please give us  48 hours in order for your provider to thoroughly review all the results before contacting the office for clarification of your results.    _______________________________________________________  If your blood pressure at your visit was 140/90 or greater, please contact your primary care physician to follow up on this.  _______________________________________________________  If you are age 79 or older, your body mass index should be between 23-30. Your Body mass index is 29.9 kg/m. If this is out of the aforementioned range listed, please consider follow up with your Primary Care Provider.  If you are age 37 or younger, your body mass index should be between 19-25. Your Body mass index is 29.9 kg/m. If this is out of the aformentioned range listed, please consider follow up with your Primary Care Provider.    ________________________________________________________  The [facility] providers would like to encourage you to use MYCHART to communicate with providers for non-urgent requests or questions.  Due to long hold times on the telephone, sending your provider a message by [facility] may be a faster and more efficient way to get a response.  Please allow 48 business hours for a response.  Please remember that this is for non-urgent requests.  _______________________________________________________   Thank you for trusting me with your gastrointestinal care. Deanna May, RNP

## 2023-12-02 NOTE — Progress Notes (Signed)
 Agree with the assessment and plan as outlined by [facility], FNP-C.  Carlitos Bottino, DO, [facility]

## 2023-12-09 ENCOUNTER — Ambulatory Visit (INDEPENDENT_AMBULATORY_CARE_PROVIDER_SITE_OTHER)

## 2023-12-09 DIAGNOSIS — H6123 Impacted cerumen, bilateral: Secondary | ICD-10-CM

## 2023-12-09 NOTE — Progress Notes (Signed)
 Patient is in office today for a nurse visit for Ear Lavage. Patient ear Irrigation was preformed bilaterally ear.   Ear Cerumen Removal  Date/Time: 12/09/2023 11:20 AM  Performed by: Sebastian Almarie LABOR, CMA Authorized by: Marylynn Verneita CROME, MD   Anesthesia: Local Anesthetic: none Location details: right ear and left ear Procedure type: irrigation  Sedation: Patient sedated: no    I reviewed the  findings of the licensed medical professional who conducted the visit. I was present in the office suite and immediately available to provide assistance and direction throughout the time the service was provided.     I have reviewed the above information and agree with above.   Verneita Marylynn, MD

## 2023-12-31 ENCOUNTER — Encounter: Payer: Self-pay | Admitting: Gastroenterology

## 2024-01-11 ENCOUNTER — Ambulatory Visit: Admitting: Gastroenterology

## 2024-01-11 ENCOUNTER — Encounter: Payer: Self-pay | Admitting: Gastroenterology

## 2024-01-11 VITALS — BP 127/79 | HR 93 | Temp 97.7°F | Resp 22 | Ht 61.0 in | Wt 158.0 lb

## 2024-01-11 DIAGNOSIS — K644 Residual hemorrhoidal skin tags: Secondary | ICD-10-CM | POA: Diagnosis not present

## 2024-01-11 DIAGNOSIS — K635 Polyp of colon: Secondary | ICD-10-CM

## 2024-01-11 DIAGNOSIS — D125 Benign neoplasm of sigmoid colon: Secondary | ICD-10-CM

## 2024-01-11 DIAGNOSIS — K5730 Diverticulosis of large intestine without perforation or abscess without bleeding: Secondary | ICD-10-CM

## 2024-01-11 DIAGNOSIS — R948 Abnormal results of function studies of other organs and systems: Secondary | ICD-10-CM

## 2024-01-11 DIAGNOSIS — K639 Disease of intestine, unspecified: Secondary | ICD-10-CM

## 2024-01-11 DIAGNOSIS — K623 Rectal prolapse: Secondary | ICD-10-CM

## 2024-01-11 DIAGNOSIS — K6289 Other specified diseases of anus and rectum: Secondary | ICD-10-CM

## 2024-01-11 DIAGNOSIS — K641 Second degree hemorrhoids: Secondary | ICD-10-CM | POA: Diagnosis not present

## 2024-01-11 MED ORDER — SODIUM CHLORIDE 0.9 % IV SOLN: 500.0000 mL | INTRAVENOUS | Status: DC

## 2024-01-11 NOTE — Op Note (Signed)
 [facility] Patient Name: Abigail Willis [facility] Procedure Date: 01/11/2024 2:57 PM MRN: [id number] Endoscopist: Sandor Flatter , MD, [id number] Age: 78 Referring MD:  Date of Birth: 06/25/1945 Gender: Female Account #: [id number] Procedure:                Flexible Sigmoidoscopy Indications:              Hematochezia, Abnormal PET scan of the GI tract Medicines:                Monitored Anesthesia Care Procedure:                Pre-Anesthesia Assessment:                           - Prior to the procedure, a History and Physical                            was performed, and patient medications and                            allergies were reviewed. The patient's tolerance of                            previous anesthesia was also reviewed. The risks                            and benefits of the procedure and the sedation                            options and risks were discussed with the patient.                            All questions were answered, and informed consent                            was obtained. Prior Anticoagulants: The patient has                            taken no anticoagulant or antiplatelet agents. ASA                            Grade Assessment: III - A patient with severe                            systemic disease. After reviewing the risks and                            benefits, the patient was deemed in satisfactory                            condition to undergo the procedure.                           After obtaining informed consent, the scope was  passed under direct vision. The [id number]-[id number] Slim SN                            [id number] was introduced through the anus and                            advanced to the the descending colon. The flexible                            sigmoidoscopy was accomplished without difficulty.                            The patient tolerated the procedure well. The                             quality of the bowel preparation was adequate. Scope In: 3:09:45 PM Scope Out: 3:25:03 PM Total Procedure Duration: 0 hours 15 minutes 18 seconds  Findings:                 Skin tags were found on perianal exam.                           A 3 mm polyp was found in the sigmoid colon. The                            polyp was sessile. The polyp was removed with a                            cold snare. Resection and retrieval were complete.                            Estimated blood loss was minimal.                           Multiple small-mouthed diverticula were found in                            the sigmoid colon and descending colon.                           Localized moderate inflammation characterized by                            congestion (edema) and erythema was found in the                            distal rectum. This was located immediately                            proximal to the dentate line, along with                            inflammatory hemorrhoid changes. Biopsies were  carefully taken with a cold forceps for histology.                            Estimated blood loss was minimal.                           Non-bleeding internal hemorrhoids were found during                            retroflexion. The hemorrhoids were Grade II                            (internal hemorrhoids that prolapse but reduce                            spontaneously). There were inflammatory changes at                            one of the hemorrhoid columns. There was a                            well-healed prior hemorrhoid banding scar in the                            distal rectum. Complications:            No immediate complications. Estimated Blood Loss:     Estimated blood loss was minimal. Impression:               - Perianal skin tags found on perianal exam.                           - One 3 mm polyp in the sigmoid colon, removed with                             a cold snare. Resected and retrieved.                           - Diverticulosis in the sigmoid colon and in the                            descending colon.                           - Localized moderate inflammation was found in the                            distal rectum. Biopsied.                           - Non-bleeding internal hemorrhoids. Recommendation:           - Discharge patient to home (with escort).                           - Advance diet as tolerated.                           -  Continue present medications.                           - Await pathology results. Sandor Flatter, MD 01/11/2024 3:31:55 PM

## 2024-01-11 NOTE — Patient Instructions (Signed)
-   Discharge patient to home (with escort). - Advance diet as tolerated. - Continue present medications. - Await pathology results.   YOU HAD AN ENDOSCOPIC PROCEDURE TODAY AT THE [facility]:   Refer to the procedure report that was given to you for any specific questions about what was found during the examination.  If the procedure report does not answer your questions, please call your gastroenterologist to clarify.  If you requested that your care partner not be given the details of your procedure findings, then the procedure report has been included in a sealed envelope for you to review at your convenience later.  YOU SHOULD EXPECT: Some feelings of bloating in the abdomen. Passage of more gas than usual.  Walking can help get rid of the air that was put into your GI tract during the procedure and reduce the bloating. If you had a lower endoscopy (such as a colonoscopy or flexible sigmoidoscopy) you may notice spotting of blood in your stool or on the toilet paper. If you underwent a bowel prep for your procedure, you may not have a normal bowel movement for a few days.  Please Note:  You might notice some irritation and congestion in your nose or some drainage.  This is from the oxygen used during your procedure.  There is no need for concern and it should clear up in a day or so.  SYMPTOMS TO REPORT IMMEDIATELY:  Following lower endoscopy (colonoscopy or flexible sigmoidoscopy):  Excessive amounts of blood in the stool  Significant tenderness or worsening of abdominal pains  Swelling of the abdomen that is new, acute  Fever of 100F or higher  For urgent or emergent issues, a gastroenterologist can be reached at any hour by calling [phone]. Do not use MyChart messaging for urgent concerns.    DIET:  We do recommend a small meal at first, but then you may proceed to your regular diet.  Drink plenty of fluids but you should avoid alcoholic beverages for 24  hours.  ACTIVITY:  You should plan to take it easy for the rest of today and you should NOT DRIVE or use heavy machinery until tomorrow (because of the sedation medicines used during the test).    FOLLOW UP: Our staff will call the number listed on your records the next business day following your procedure.  We will call around 7:15- 8:00 am to check on you and address any questions or concerns that you may have regarding the information given to you following your procedure. If we do not reach you, we will leave a message.     If any biopsies were taken you will be contacted by phone or by letter within the next 1-3 weeks.  Please call us  at [phone] if you have not heard about the biopsies in 3 weeks.    SIGNATURES/CONFIDENTIALITY: You and/or your care partner have signed paperwork which will be entered into your electronic medical record.  These signatures attest to the fact that that the information above on your After Visit Summary has been reviewed and is understood.  Full responsibility of the confidentiality of this discharge information lies with you and/or your care-partner.

## 2024-01-11 NOTE — Progress Notes (Signed)
 Called to room to assist during endoscopic procedure.  Patient ID and intended procedure confirmed with present staff. Received instructions for my participation in the procedure from the performing physician.

## 2024-01-11 NOTE — Progress Notes (Signed)
 Vss nad trans to pacu

## 2024-01-11 NOTE — Progress Notes (Signed)
 GASTROENTEROLOGY PROCEDURE H&P NOTE   Primary Care Physician: Dineen Rollene MATSU, FNP    Reason for Procedure:  Abnormal PET scan  Plan:    Flexible sigmoidoscopy  Patient is appropriate for endoscopic procedure(s) in the ambulatory ([facility]) setting.  The nature of the procedure, as well as the risks, benefits, and alternatives were carefully and thoroughly reviewed with the patient. Ample time for discussion and questions allowed. The patient understood, was satisfied, and agreed to proceed.     HPI: Abigail Willis is a 78 y.o. female who presents for evaluation of abnormal PET scan.  Otherwise, no significant changes in clinical history since last office appointment on 12/01/2023.   Patient has history of IBS-D. Patient reports she has up to 8 stools per day with her IBS-D. She reports she has about 1 episode per month and will last 2-3 days then subsides. She uses Levbid  prn which works well. She has intermittent issues with her hemorrhoids, has had banding in past.Patient denies nausea, vomiting, or weight loss.   01/05/2023 PET scan  IMPRESSION: 1. 11 mm anterior segment left upper lobe nodule does not show metabolism above blood pool but is visualized and shows indolent growth/solidification when compared with prior CT examinations. 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 2. Mild focal hypermetabolism associated with the rectum. Rectum is under distended, limiting further evaluation. 3. Left adrenal adenoma. 4. Aortic atherosclerosis (ICD10-I70.0). Coronary artery calcification. 5.  Emphysema (ICD10-J43.9).   Procedures: Colonoscopy 10/2010 Dr. Teressa (for previous adenomatous polyps in [facility] including a 1cm pedunculated polyp in 1998) 4 subCM polyps removed, one was adenomatous, She was recommended to have repeat colonoscopy at 5 year interval.     Colonoscopy September 2017 Dr. Teressa found 3 subcentimeter polyps.  Also internal and  external hemorrhoids.  2 of these polyps were sessile serrated adenomas, the other was hyperplastic.   Colonoscopy June 2022 normal terminal ileum, the entire colon appeared slightly erythematous.  Biopsies were taken from right and left colon.  Hemorrhoids were found, medium size.  No polyps or cancers.  Biopsies showed no sign of micro or macro scopic colitis.   Past Medical History:  Diagnosis Date   Allergy    SINUS   Cancer (HCC)    skin   Cataract    bilateral   Colon polyps 11/14/2013   Tubular adenoma, hyperplastic polyps   COPD (chronic obstructive pulmonary disease) (HCC)    Diverticulosis 11/15/2010   Moderate   External hemorrhoids    Hyperlipidemia    IBS (irritable bowel syndrome)    Osteoporosis    Squamous cell carcinoma in situ    Vertigo     Past Surgical History:  Procedure Laterality Date   BREAST BIOPSY     CATARACT EXTRACTION W/PHACO Right 09/24/2021   Procedure: CATARACT EXTRACTION PHACO AND INTRAOCULAR LENS PLACEMENT (IOC) RIGHT;  Surgeon: Jaye Fallow, MD;  Location: [facility];  Service: Ophthalmology;  Laterality: Right;  15.97 01:15.5   CATARACT EXTRACTION W/PHACO Left 10/08/2021   Procedure: CATARACT EXTRACTION PHACO AND INTRAOCULAR LENS PLACEMENT (IOC) LEFT;  Surgeon: Jaye Fallow, MD;  Location: [facility];  Service: Ophthalmology;  Laterality: Left;  9.98 0:57.7   COLONOSCOPY     GANGLION CYST EXCISION     HAMMER TOE SURGERY     HEMORRHOID BANDING     LEEP      twice, in [place]    MOHS SURGERY  2019   PAROTIDECTOMY Left    08/27/22 [facility]  in [place], Dr Slater Passy left partial parotidectomy  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 Authorizing Provider  acetaminophen  (TYLENOL ) 500 MG tablet Take 500 mg by mouth as needed.   Yes [provider]  aspirin 81 MG tablet Take 81  mg by mouth daily.   Yes [provider]  hyoscyamine  (LEVBID ) 0.375 MG 12 hr tablet Take 1 tablet (0.375 mg total) by mouth every 12 (twelve) hours as needed. 12/01/23  Yes May, Deanna J, NP  pravastatin  (PRAVACHOL ) 20 MG tablet TAKE 1 TABLET(20 MG) BY MOUTH AT BEDTIME 10/22/23  Yes Arnett, Rollene MATSU, FNP  Tiotropium Bromide Monohydrate  (SPIRIVA  RESPIMAT) 2.5 MCG/ACT AERS Inhale 2 puffs into the lungs daily. 01/14/23  Yes Tamea Dedra CROME, MD  azithromycin  (ZITHROMAX ) 500 MG tablet Take 1 tablet (500 mg total) by mouth daily. 11/30/23   Narendra, Nischal, MD  carbamide peroxide (DEBROX) 6.5 % OTIC solution Place 5 drops into both ears 2 (two) times daily. 11/30/23   Narendra, Nischal, MD  cetirizine (ZYRTEC) 10 MG tablet Take 10 mg by mouth daily.    [provider]  ibuprofen (ADVIL,MOTRIN) 200 MG tablet Take 200 mg by mouth as needed.    [provider]  loperamide  (IMODIUM  A-D) 2 MG tablet Take 1 tablet (2 mg total) by mouth in the morning. 10/10/20   Teressa Toribio SQUIBB, MD  pseudoephedrine-guaifenesin (MUCINEX D) 60-600 MG per tablet Take 1 tablet by mouth every 12 (twelve) hours as needed.    [provider]  sodium chloride  (OCEAN) 0.65 % SOLN nasal spray Place 1 spray into both nostrils as needed for congestion.    [provider]    Current Outpatient Medications  Medication Sig Dispense Refill   acetaminophen  (TYLENOL ) 500 MG tablet Take 500 mg by mouth as needed.     aspirin 81 MG tablet Take 81 mg by mouth daily.     hyoscyamine  (LEVBID ) 0.375 MG 12 hr tablet Take 1 tablet (0.375 mg total) by mouth every 12 (twelve) hours as needed. 60 tablet 6   pravastatin  (PRAVACHOL ) 20 MG tablet TAKE 1 TABLET(20 MG) BY MOUTH AT BEDTIME 90 tablet 3   Tiotropium Bromide Monohydrate  (SPIRIVA  RESPIMAT) 2.5 MCG/ACT AERS Inhale 2 puffs into the lungs daily. 1 each 5   azithromycin  (ZITHROMAX ) 500 MG tablet Take 1 tablet (500 mg total) by mouth daily. 7 tablet 0    carbamide peroxide (DEBROX) 6.5 % OTIC solution Place 5 drops into both ears 2 (two) times daily. 15 mL 0   cetirizine (ZYRTEC) 10 MG tablet Take 10 mg by mouth daily.     ibuprofen (ADVIL,MOTRIN) 200 MG tablet Take 200 mg by mouth as needed.     loperamide  (IMODIUM  A-D) 2 MG tablet Take 1 tablet (2 mg total) by mouth in the morning. 30 tablet 0   pseudoephedrine-guaifenesin (MUCINEX D) 60-600 MG per tablet Take 1 tablet by mouth every 12 (twelve) hours as needed.     sodium chloride  (OCEAN) 0.65 % SOLN nasal spray Place 1 spray into both nostrils as needed for congestion.     Current Facility-Administered Medications  Medication Dose Route Frequency Provider Last Rate Last Admin   0.9 %  sodium chloride  infusion  500 mL Intravenous Continuous Forestine Macho V, DO        Allergies as of  01/11/2024 - Review Complete 01/11/2024  Allergen Reaction Noted   Avelox  [moxifloxacin  hcl in nacl] Other (See Comments) 03/16/2012   Penicillins Rash 03/06/2010    Family History  Problem Relation Age of Onset   Heart attack Mother 49   Ovarian cancer Mother    Asthma Father    Brain cancer Sister    Colon cancer Cousin    Breast cancer Neg Hx    Adrenal disorder Neg Hx    Esophageal cancer Neg Hx    Rectal cancer Neg Hx    Stomach cancer Neg Hx     Social History   Socioeconomic History   Marital status: Married    Spouse name: Not on file   Number of children: 1   Years of education: Not on file   Highest education level: Bachelor's degree (e.g., BA, AB, BS)  Occupational History   Occupation: Retired  Tobacco Use   Smoking status: Every Day    Current packs/day: 1.00    Average packs/day: 1 pack/day for 52.0 years (52.0 ttl pk-yrs)    Types: Cigarettes   Smokeless tobacco: Never   Tobacco comments:    0.75 PPD for 55 years. 01/07/2023 khj  Vaping Use   Vaping status: Never Used  Substance and Sexual Activity   Alcohol use: Yes    Alcohol/week: 7.0 standard drinks of alcohol     Types: 7 Glasses of wine per week    Comment: SOCIAL   Drug use: No   Sexual activity: Yes  Other Topics Concern   Not on file  Social History Narrative   Moved from [place]  to be closer to her daughter, spends winters at [facility]  home.      Has HPOA- husband Butler.   Would desire feeding tube, no prolonged life support.   Not sure about feeding tubes.            Social Drivers of Corporate investment banker Strain: Low Risk  (11/26/2023)   Overall Financial Resource Strain (CARDIA)    Difficulty of Paying Living Expenses: Not hard at all  Food Insecurity: No Food Insecurity (11/26/2023)   Hunger Vital Sign    Worried About Running Out of Food in the Last Year: Never true    Ran Out of Food in the Last Year: Never true  Transportation Needs: No Transportation Needs (11/26/2023)   PRAPARE - Administrator, Civil Service (Medical): No    Lack of Transportation (Non-Medical): No  Physical Activity: Insufficiently Active (11/26/2023)   Exercise Vital Sign    Days of Exercise per Week: 2 days    Minutes of Exercise per Session: 50 min  Stress: No Stress Concern Present (11/26/2023)   [employer] of Occupational Health - Occupational Stress Questionnaire    Feeling of Stress: Not at all  Social Connections: Socially Integrated (11/26/2023)   Social Connection and Isolation Panel    Frequency of Communication with Friends and Family: More than three times a week    Frequency of Social Gatherings with Friends and Family: More than three times a week    Attends Religious Services: More than 4 times per year    Active Member of [employer] or Organizations: Yes    Attends Banker Meetings: More than 4 times per year    Marital Status: Married  Catering manager Violence: Not At Risk (01/14/2023)   Humiliation, Afraid, Rape, and Kick questionnaire    Fear of Current or Ex-Partner: No  Emotionally Abused: No    Physically Abused: No    Sexually  Abused: No    Physical Exam: Vital signs in last 24 hours: @BP  (!) 128/93   Pulse 81   Temp 97.7 F (36.5 C) (Temporal)   Ht 5' 1 (1.549 m)   Wt 158 lb (71.7 kg)   SpO2 98%   BMI 29.85 kg/m  GEN: NAD EYE: Sclerae anicteric ENT: MMM CV: Non-tachycardic Pulm: CTA b/l GI: Soft, NT/ND NEURO:  Alert & Oriented x 3   Sandor Flatter, DO [facility]   01/11/2024 1:58 PM

## 2024-01-12 ENCOUNTER — Telehealth: Payer: Self-pay | Admitting: *Deleted

## 2024-01-12 NOTE — Telephone Encounter (Signed)
  Follow up Call-     01/11/2024    1:39 PM  Call back number  Post procedure Call Back phone  # [phone]  Permission to leave phone message Yes     Patient questions:  Do you have a fever, pain , or abdominal swelling? No. Pain Score  0 *  Have you tolerated food without any problems? Yes.    Have you been able to return to your normal activities? Yes.    Do you have any questions about your discharge instructions: Diet   No. Medications  No. Follow up visit  No.  Do you have questions or concerns about your Care? No.  Actions: * If pain score is 4 or above: No action needed, pain <4.

## 2024-01-13 ENCOUNTER — Encounter: Payer: Self-pay | Admitting: *Deleted

## 2024-01-14 ENCOUNTER — Ambulatory Visit: Payer: Self-pay | Admitting: Gastroenterology

## 2024-01-14 LAB — SURGICAL PATHOLOGY

## 2024-01-19 ENCOUNTER — Ambulatory Visit: Payer: Medicare Other

## 2024-02-22 ENCOUNTER — Ambulatory Visit
Admission: RE | Admit: 2024-02-22 | Discharge: 2024-02-22 | Disposition: A | Source: Ambulatory Visit | Attending: Family

## 2024-02-22 DIAGNOSIS — Z1231 Encounter for screening mammogram for malignant neoplasm of breast: Secondary | ICD-10-CM
